# Patient Record
Sex: Female | Born: 1985 | Race: White | Hispanic: No | Marital: Married | Smoking: Current every day smoker
Health system: Southern US, Community
[De-identification: ages and names within clinical notes are randomized; demographics above are authoritative.]

## PROBLEM LIST (undated history)

## (undated) ENCOUNTER — Inpatient Hospital Stay (HOSPITAL_COMMUNITY): Payer: Self-pay

## (undated) DIAGNOSIS — J45909 Unspecified asthma, uncomplicated: Secondary | ICD-10-CM

## (undated) DIAGNOSIS — Z30017 Encounter for initial prescription of implantable subdermal contraceptive: Secondary | ICD-10-CM

## (undated) DIAGNOSIS — K219 Gastro-esophageal reflux disease without esophagitis: Secondary | ICD-10-CM

## (undated) DIAGNOSIS — K8020 Calculus of gallbladder without cholecystitis without obstruction: Secondary | ICD-10-CM

## (undated) DIAGNOSIS — G43909 Migraine, unspecified, not intractable, without status migrainosus: Secondary | ICD-10-CM

## (undated) DIAGNOSIS — Z8719 Personal history of other diseases of the digestive system: Secondary | ICD-10-CM

## (undated) DIAGNOSIS — G5600 Carpal tunnel syndrome, unspecified upper limb: Secondary | ICD-10-CM

## (undated) HISTORY — DX: Migraine, unspecified, not intractable, without status migrainosus: G43.909

## (undated) HISTORY — PX: TONSILLECTOMY: SUR1361

## (undated) HISTORY — PX: TONSILECTOMY, ADENOIDECTOMY, BILATERAL MYRINGOTOMY AND TUBES: SHX2538

## (undated) HISTORY — DX: Personal history of other diseases of the digestive system: Z87.19

## (undated) HISTORY — DX: Encounter for initial prescription of implantable subdermal contraceptive: Z30.017

## (undated) HISTORY — DX: Gastro-esophageal reflux disease without esophagitis: K21.9

## (undated) HISTORY — PX: CYSTOSCOPY W/ URETERAL STENT PLACEMENT: SHX1429

## (undated) HISTORY — PX: TUBAL LIGATION: SHX77

---
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
## 2007-02-03 ENCOUNTER — Other Ambulatory Visit: Admission: RE | Admit: 2007-02-03 | Discharge: 2007-02-03 | Payer: Self-pay | Admitting: Obstetrics and Gynecology

## 2007-06-05 ENCOUNTER — Emergency Department (HOSPITAL_COMMUNITY): Admission: EM | Admit: 2007-06-05 | Discharge: 2007-06-06 | Payer: Self-pay | Admitting: Emergency Medicine

## 2007-11-28 ENCOUNTER — Emergency Department (HOSPITAL_COMMUNITY): Admission: EM | Admit: 2007-11-28 | Discharge: 2007-11-28 | Payer: Self-pay | Admitting: Emergency Medicine

## 2008-01-14 ENCOUNTER — Emergency Department (HOSPITAL_COMMUNITY): Admission: EM | Admit: 2008-01-14 | Discharge: 2008-01-14 | Payer: Self-pay | Admitting: Emergency Medicine

## 2008-02-22 ENCOUNTER — Emergency Department (HOSPITAL_COMMUNITY): Admission: EM | Admit: 2008-02-22 | Discharge: 2008-02-22 | Payer: Self-pay | Admitting: Diagnostic Radiology

## 2008-02-26 ENCOUNTER — Emergency Department (HOSPITAL_COMMUNITY): Admission: EM | Admit: 2008-02-26 | Discharge: 2008-02-26 | Payer: Self-pay | Admitting: Emergency Medicine

## 2008-12-06 ENCOUNTER — Emergency Department (HOSPITAL_COMMUNITY): Admission: EM | Admit: 2008-12-06 | Discharge: 2008-12-06 | Payer: Self-pay | Admitting: Emergency Medicine

## 2009-02-28 ENCOUNTER — Emergency Department (HOSPITAL_COMMUNITY): Admission: EM | Admit: 2009-02-28 | Discharge: 2009-02-28 | Payer: Self-pay | Admitting: Emergency Medicine

## 2009-03-01 ENCOUNTER — Ambulatory Visit (HOSPITAL_COMMUNITY): Admission: RE | Admit: 2009-03-01 | Discharge: 2009-03-01 | Payer: Self-pay | Admitting: Emergency Medicine

## 2009-05-16 ENCOUNTER — Emergency Department (HOSPITAL_COMMUNITY): Admission: EM | Admit: 2009-05-16 | Discharge: 2009-05-16 | Payer: Self-pay | Admitting: Emergency Medicine

## 2009-06-20 ENCOUNTER — Other Ambulatory Visit: Admission: RE | Admit: 2009-06-20 | Discharge: 2009-06-20 | Payer: Self-pay | Admitting: Obstetrics and Gynecology

## 2009-06-22 ENCOUNTER — Inpatient Hospital Stay (HOSPITAL_COMMUNITY): Admission: AD | Admit: 2009-06-22 | Discharge: 2009-06-22 | Payer: Self-pay | Admitting: Obstetrics & Gynecology

## 2009-07-29 ENCOUNTER — Inpatient Hospital Stay (HOSPITAL_COMMUNITY): Admission: AD | Admit: 2009-07-29 | Discharge: 2009-07-30 | Payer: Self-pay | Admitting: Family Medicine

## 2009-09-11 ENCOUNTER — Emergency Department (HOSPITAL_COMMUNITY): Admission: EM | Admit: 2009-09-11 | Discharge: 2009-09-12 | Payer: Self-pay | Admitting: Emergency Medicine

## 2009-10-20 ENCOUNTER — Emergency Department (HOSPITAL_COMMUNITY): Admission: EM | Admit: 2009-10-20 | Discharge: 2009-10-21 | Payer: Self-pay | Admitting: Emergency Medicine

## 2009-10-31 ENCOUNTER — Emergency Department (HOSPITAL_COMMUNITY): Admission: EM | Admit: 2009-10-31 | Discharge: 2009-10-31 | Payer: Self-pay | Admitting: Emergency Medicine

## 2009-11-18 ENCOUNTER — Ambulatory Visit: Payer: Self-pay | Admitting: Nurse Practitioner

## 2009-12-27 ENCOUNTER — Emergency Department (HOSPITAL_COMMUNITY): Admission: EM | Admit: 2009-12-27 | Discharge: 2009-12-27 | Payer: Self-pay | Admitting: Emergency Medicine

## 2010-01-01 ENCOUNTER — Ambulatory Visit: Payer: Self-pay | Admitting: Advanced Practice Midwife

## 2010-01-01 ENCOUNTER — Inpatient Hospital Stay (HOSPITAL_COMMUNITY): Admission: AD | Admit: 2010-01-01 | Discharge: 2010-01-03 | Payer: Self-pay | Admitting: Obstetrics & Gynecology

## 2010-04-10 ENCOUNTER — Emergency Department (HOSPITAL_COMMUNITY): Admission: EM | Admit: 2010-04-10 | Discharge: 2010-04-10 | Payer: Self-pay | Admitting: Emergency Medicine

## 2010-04-24 ENCOUNTER — Inpatient Hospital Stay (HOSPITAL_COMMUNITY): Admission: AD | Admit: 2010-04-24 | Discharge: 2009-11-20 | Payer: Self-pay | Admitting: Family Medicine

## 2010-07-02 ENCOUNTER — Emergency Department (HOSPITAL_COMMUNITY)
Admission: EM | Admit: 2010-07-02 | Discharge: 2010-07-02 | Disposition: A | Discharge status: Home / Self Care | Payer: Self-pay | Attending: Emergency Medicine | Admitting: Emergency Medicine

## 2010-07-02 DIAGNOSIS — M25539 Pain in unspecified wrist: Secondary | ICD-10-CM | POA: Insufficient documentation

## 2010-07-02 DIAGNOSIS — R209 Unspecified disturbances of skin sensation: Secondary | ICD-10-CM | POA: Insufficient documentation

## 2010-07-04 ENCOUNTER — Emergency Department (HOSPITAL_COMMUNITY)
Admission: EM | Admit: 2010-07-04 | Discharge: 2010-07-05 | Discharge status: Against Medical Advice | Payer: Self-pay | Attending: Emergency Medicine | Admitting: Emergency Medicine

## 2010-07-29 LAB — CBC
HCT: 41.7 % (ref 36.0–46.0)
Hemoglobin: 14.9 g/dL (ref 12.0–15.0)
MCH: 29.6 pg (ref 26.0–34.0)
MCHC: 35.7 g/dL (ref 30.0–36.0)
MCV: 82.7 fL (ref 78.0–100.0)
RBC: 5.04 MIL/uL (ref 3.87–5.11)
RDW: 13.5 % (ref 11.5–15.5)
WBC: 9.2 10*3/uL (ref 4.0–10.5)

## 2010-07-29 LAB — COMPREHENSIVE METABOLIC PANEL
AST: 20 U/L (ref 0–37)
BUN: 11 mg/dL (ref 6–23)
Calcium: 8.9 mg/dL (ref 8.4–10.5)
Glucose, Bld: 84 mg/dL (ref 70–99)
Potassium: 3.5 mEq/L (ref 3.5–5.1)
Total Bilirubin: 0.4 mg/dL (ref 0.3–1.2)

## 2010-07-29 LAB — DIFFERENTIAL
Basophils Absolute: 0.1 10*3/uL (ref 0.0–0.1)
Basophils Relative: 1 % (ref 0–1)
Eosinophils Relative: 2 % (ref 0–5)
Monocytes Absolute: 0.6 10*3/uL (ref 0.1–1.0)
Monocytes Relative: 6 % (ref 3–12)
Neutro Abs: 6.6 10*3/uL (ref 1.7–7.7)

## 2010-07-29 LAB — URINALYSIS, ROUTINE W REFLEX MICROSCOPIC
Bilirubin Urine: NEGATIVE
Glucose, UA: NEGATIVE mg/dL
Ketones, ur: NEGATIVE mg/dL
Nitrite: NEGATIVE
Specific Gravity, Urine: 1.02 (ref 1.005–1.030)
Urobilinogen, UA: 0.2 mg/dL (ref 0.0–1.0)
pH: 5.5 (ref 5.0–8.0)

## 2010-07-29 LAB — URINE MICROSCOPIC-ADD ON

## 2010-08-01 LAB — URINALYSIS, ROUTINE W REFLEX MICROSCOPIC
Bilirubin Urine: NEGATIVE
Glucose, UA: NEGATIVE mg/dL
Ketones, ur: NEGATIVE mg/dL
Leukocytes, UA: NEGATIVE
Nitrite: NEGATIVE
Nitrite: NEGATIVE
Protein, ur: 30 mg/dL — AB
Specific Gravity, Urine: >1.03 — ABNORMAL HIGH (ref 1.005–1.030)
pH: 6 (ref 5.0–8.0)

## 2010-08-01 LAB — COMPREHENSIVE METABOLIC PANEL
BUN: 7 mg/dL (ref 6–23)
CO2: 22 mEq/L (ref 19–32)
Calcium: 8.8 mg/dL (ref 8.4–10.5)
Chloride: 107 mEq/L (ref 96–112)
Creatinine, Ser: 0.57 mg/dL (ref 0.4–1.2)
GFR calc non Af Amer: >60 mL/min (ref >60)
Glucose, Bld: 87 mg/dL (ref 70–99)
Potassium: 3.9 mEq/L (ref 3.5–5.1)
Sodium: 138 mEq/L (ref 135–145)

## 2010-08-01 LAB — URINALYSIS, DIPSTICK ONLY
Glucose, UA: NEGATIVE mg/dL
Protein, ur: NEGATIVE mg/dL
Specific Gravity, Urine: 1.02 (ref 1.005–1.030)
pH: 6 (ref 5.0–8.0)

## 2010-08-01 LAB — URIC ACID: Uric Acid, Serum: 5 mg/dL (ref 2.4–7.0)

## 2010-08-01 LAB — CBC
HCT: 32.1 % — ABNORMAL LOW (ref 36.0–46.0)
Hemoglobin: 11 g/dL — ABNORMAL LOW (ref 12.0–15.0)
Hemoglobin: 13.8 g/dL (ref 12.0–15.0)
MCH: 29.8 pg (ref 26.0–34.0)
MCH: 30.2 pg (ref 26.0–34.0)
MCHC: 34.2 g/dL (ref 30.0–36.0)
MCV: 88.4 fL (ref 78.0–100.0)
RBC: 4.63 MIL/uL (ref 3.87–5.11)
WBC: 11.4 10*3/uL — ABNORMAL HIGH (ref 4.0–10.5)
WBC: 8.8 10*3/uL (ref 4.0–10.5)

## 2010-08-01 LAB — RPR: RPR Ser Ql: NONREACTIVE

## 2010-08-01 LAB — URINE MICROSCOPIC-ADD ON

## 2010-08-03 LAB — CBC
HCT: 35.3 % — ABNORMAL LOW (ref 36.0–46.0)
Hemoglobin: 12.2 g/dL (ref 12.0–15.0)
MCH: 30.6 pg (ref 26.0–34.0)
MCHC: 34.7 g/dL (ref 30.0–36.0)
RBC: 3.99 MIL/uL (ref 3.87–5.11)

## 2010-08-03 LAB — DIFFERENTIAL
Basophils Absolute: 0 10*3/uL (ref 0.0–0.1)
Basophils Relative: 0 % (ref 0–1)
Eosinophils Absolute: 0.1 10*3/uL (ref 0.0–0.7)
Eosinophils Relative: 1 % (ref 0–5)
Neutrophils Relative %: 84 % — ABNORMAL HIGH (ref 43–77)

## 2010-08-03 LAB — URINE MICROSCOPIC-ADD ON

## 2010-08-03 LAB — URINE CULTURE

## 2010-08-03 LAB — COMPREHENSIVE METABOLIC PANEL
ALT: 13 U/L (ref 0–35)
AST: 14 U/L (ref 0–37)
Alkaline Phosphatase: 62 U/L (ref 39–117)
CO2: 25 mEq/L (ref 19–32)
Chloride: 104 mEq/L (ref 96–112)
GFR calc Af Amer: >60 mL/min (ref >60)
GFR calc non Af Amer: >60 mL/min (ref >60)
Glucose, Bld: 94 mg/dL (ref 70–99)
Sodium: 135 mEq/L (ref 135–145)
Total Bilirubin: 0.4 mg/dL (ref 0.3–1.2)

## 2010-08-03 LAB — URINALYSIS, ROUTINE W REFLEX MICROSCOPIC
Bilirubin Urine: NEGATIVE
Specific Gravity, Urine: 1.01 (ref 1.005–1.030)
Urobilinogen, UA: 0.2 mg/dL (ref 0.0–1.0)
pH: 6.5 (ref 5.0–8.0)

## 2010-08-07 LAB — URINALYSIS, ROUTINE W REFLEX MICROSCOPIC
Glucose, UA: NEGATIVE mg/dL
Ketones, ur: NEGATIVE mg/dL
Nitrite: NEGATIVE
Protein, ur: NEGATIVE mg/dL
Urobilinogen, UA: 0.2 mg/dL (ref 0.0–1.0)

## 2010-08-11 LAB — URINALYSIS, ROUTINE W REFLEX MICROSCOPIC
Glucose, UA: 100 mg/dL — AB
Hgb urine dipstick: NEGATIVE
Ketones, ur: NEGATIVE mg/dL
pH: 6 (ref 5.0–8.0)

## 2010-08-11 LAB — URINE MICROSCOPIC-ADD ON: RBC / HPF: NONE SEEN RBC/hpf (ref <3)

## 2010-08-18 LAB — URINALYSIS, ROUTINE W REFLEX MICROSCOPIC
Bilirubin Urine: NEGATIVE
Ketones, ur: NEGATIVE mg/dL
Nitrite: NEGATIVE
Specific Gravity, Urine: <1.005 — ABNORMAL LOW (ref 1.005–1.030)
Urobilinogen, UA: 0.2 mg/dL (ref 0.0–1.0)

## 2010-08-18 LAB — PREGNANCY, URINE: Preg Test, Ur: POSITIVE

## 2010-08-18 LAB — URINE MICROSCOPIC-ADD ON

## 2010-08-21 LAB — CBC
HCT: 43.9 % (ref 36.0–46.0)
MCV: 86.3 fL (ref 78.0–100.0)
RBC: 5.09 MIL/uL (ref 3.87–5.11)
WBC: 7.7 10*3/uL (ref 4.0–10.5)

## 2010-08-21 LAB — COMPREHENSIVE METABOLIC PANEL
AST: 14 U/L (ref 0–37)
BUN: 9 mg/dL (ref 6–23)
CO2: 28 mEq/L (ref 19–32)
Chloride: 106 mEq/L (ref 96–112)
Creatinine, Ser: 0.58 mg/dL (ref 0.4–1.2)
GFR calc non Af Amer: >60 mL/min (ref >60)
Glucose, Bld: 92 mg/dL (ref 70–99)
Total Bilirubin: 0.5 mg/dL (ref 0.3–1.2)

## 2010-08-21 LAB — URINALYSIS, ROUTINE W REFLEX MICROSCOPIC
Glucose, UA: NEGATIVE mg/dL
Hgb urine dipstick: NEGATIVE
Specific Gravity, Urine: 1.02 (ref 1.005–1.030)

## 2010-08-21 LAB — LIPASE, BLOOD: Lipase: 15 U/L (ref 11–59)

## 2010-08-21 LAB — DIFFERENTIAL
Basophils Absolute: 0 10*3/uL (ref 0.0–0.1)
Eosinophils Relative: 1 % (ref 0–5)
Lymphocytes Relative: 25 % (ref 12–46)
Neutro Abs: 5.2 10*3/uL (ref 1.7–7.7)

## 2010-09-21 ENCOUNTER — Emergency Department (HOSPITAL_COMMUNITY): Payer: Medicaid Other

## 2010-09-21 ENCOUNTER — Inpatient Hospital Stay (HOSPITAL_COMMUNITY)
Admission: EM | Admit: 2010-09-21 | Discharge: 2010-09-23 | DRG: 195 | Disposition: A | Discharge status: Home / Self Care | Payer: Medicaid Other | Attending: Internal Medicine | Admitting: Internal Medicine

## 2010-09-21 DIAGNOSIS — R091 Pleurisy: Secondary | ICD-10-CM | POA: Diagnosis present

## 2010-09-21 DIAGNOSIS — R000 Tachycardia, unspecified: Secondary | ICD-10-CM | POA: Diagnosis present

## 2010-09-21 DIAGNOSIS — J189 Pneumonia, unspecified organism: Principal | ICD-10-CM | POA: Diagnosis present

## 2010-09-21 DIAGNOSIS — K219 Gastro-esophageal reflux disease without esophagitis: Secondary | ICD-10-CM | POA: Diagnosis present

## 2010-09-21 LAB — URINALYSIS, ROUTINE W REFLEX MICROSCOPIC
Bilirubin Urine: NEGATIVE
Protein, ur: NEGATIVE mg/dL
Urobilinogen, UA: 0.2 mg/dL (ref 0.0–1.0)

## 2010-09-21 LAB — BASIC METABOLIC PANEL
GFR calc Af Amer: >60 mL/min (ref >60)
GFR calc non Af Amer: >60 mL/min (ref >60)
Glucose, Bld: 124 mg/dL — ABNORMAL HIGH (ref 70–99)
Potassium: 3.9 mEq/L (ref 3.5–5.1)
Sodium: 140 mEq/L (ref 135–145)

## 2010-09-21 LAB — DIFFERENTIAL
Basophils Absolute: 0 10*3/uL (ref 0.0–0.1)
Eosinophils Relative: 1 % (ref 0–5)
Lymphocytes Relative: 6 % — ABNORMAL LOW (ref 12–46)

## 2010-09-21 LAB — URINE MICROSCOPIC-ADD ON

## 2010-09-21 LAB — CBC
HCT: 44.8 % (ref 36.0–46.0)
Platelets: 227 10*3/uL (ref 150–400)
RDW: 13.3 % (ref 11.5–15.5)
WBC: 15.5 10*3/uL — ABNORMAL HIGH (ref 4.0–10.5)

## 2010-09-21 LAB — POCT PREGNANCY, URINE: Preg Test, Ur: NEGATIVE

## 2010-09-21 MED ORDER — IOHEXOL 350 MG/ML SOLN: 100.0000 mL | Freq: Once | INTRAVENOUS | Status: AC | PRN

## 2010-09-22 LAB — INFLUENZA PANEL BY PCR (TYPE A & B): H1N1 flu by pcr: NOT DETECTED

## 2010-09-22 LAB — DIFFERENTIAL
Basophils Relative: 0 % (ref 0–1)
Lymphs Abs: 1 10*3/uL (ref 0.7–4.0)
Monocytes Relative: 6 % (ref 3–12)
Neutro Abs: 10.5 10*3/uL — ABNORMAL HIGH (ref 1.7–7.7)
Neutrophils Relative %: 84 % — ABNORMAL HIGH (ref 43–77)

## 2010-09-22 LAB — CBC
Hemoglobin: 12.9 g/dL (ref 12.0–15.0)
MCH: 28.2 pg (ref 26.0–34.0)
RBC: 4.58 MIL/uL (ref 3.87–5.11)
WBC: 12.5 10*3/uL — ABNORMAL HIGH (ref 4.0–10.5)

## 2010-09-22 LAB — BASIC METABOLIC PANEL
CO2: 22 mEq/L (ref 19–32)
Chloride: 104 mEq/L (ref 96–112)
Creatinine, Ser: 0.6 mg/dL (ref 0.4–1.2)
GFR calc Af Amer: >60 mL/min (ref >60)
Potassium: 3.7 mEq/L (ref 3.5–5.1)

## 2010-09-26 NOTE — H&P (Signed)
NAME:  Patricia Williamson, Patricia Williamson NO.:  [id number]  MEDICAL RECORD NO.:  [id number]           PATIENT TYPE:  I  LOCATION:  A311                          FACILITY:  [facility]  PHYSICIAN:  Vania Rea, M.D. DATE OF BIRTH:  Dec 16, 1985  DATE OF ADMISSION:  09/21/2010 DATE OF DISCHARGE:  LH                             HISTORY & PHYSICAL   PRIMARY CARE PHYSICIAN:  Corrie Mckusick, MD  DICTATING PHYSICIAN:  Vania Rea, MD  CHIEF COMPLAINT:  Chest pain and shortness of breath since yesterday.  HISTORY OF PRESENT ILLNESS:  This is a 25 year old morbidly obese Caucasian lady with a history of childhood asthma but has not had any asthma attacks for many years who yesterday had sudden onset of chest pain and noticed extreme shortness of breath when she tried to lie down flat.  Initially there was no cough and no wheezing nor fever.  She did notice today that she was having chills and felt extremely cold.  Late last night she started coughing, however, the cough was nonproductive. She came to the emergency room where she was found to be very short of breath and had a CT angiogram of the chest done to rule out pulmonary embolus.  It revealed evidence of pneumonia and bronchiolitis and the hospitalist service was called to assist with management.  The patient describes the pain as central and aggravated by breathing, aggravated by coughing.  The patient has had no attacks of asthma since childhood but in the emergency room she developed wheezing and received bronchodilators by nebulizer.  As reported the cough is nonproductive.  She has had no sweating, no calf pain.  No nausea, no vomiting, no diarrhea.  PAST MEDICAL HISTORY: 1. Childhood asthma. 2. GERD. 3. Anxiety and depression. 4. History of pyelonephritis, recurrent.  PAST SURGICAL HISTORY:  Tonsillectomy and history of stent placement for renal stones at age 51 and 76.  MEDICATIONS:  None.  ALLERGIES:  SULFA  causes anaphylaxis.  SOCIAL HISTORY:  No history of tobacco, alcohol, or illicit drug use. She is unemployed.  She is gravida 1 and has a 17-month-old baby.  FAMILY HISTORY:  Significant for father who had liver cirrhosis and apparently heart disease.  He was a heavy drinker.  She has a brother with asthma.  No other significant family medical problems.  REVIEW OF SYSTEMS:  Other than noted above unremarkable.  PHYSICAL EXAMINATION:  GENERAL:  A very pleasant but somewhat apprehensive young Caucasian lady lying in the stretcher very tachypneic. VITALS:  Temperature is 100.0, pulse 135, respiration 22, blood pressure 109/45.  She is saturating at 100% on 2 liters.  She gives her height as exactly 5 feet and her weight as 200 pounds. HEENT:  Her pupils are round, equal, and reactive.  Mucous membranes pink and dry.  Anicteric. NECK:  No cervical lymphadenopathy.  She had a thick neck.  No thyromegaly or carotid bruit. CHEST:  Actually clear to auscultation bilaterally.  There is no wheezing or crackles. CARDIOVASCULAR SYSTEM:  Tachycardiac with distant heart sounds.  No murmurs heard. ABDOMEN:  Obese, soft, nontender.  No masses. EXTREMITIES:  Without edema.  No calf tenderness.  Dorsalis pulses are 2+ and bounding bilaterally.  She has no bony joint deformity. CENTRAL NERVOUS SYSTEM:  Cranial nerves II-XII are grossly intact.  She has no focal lateralizing signs.  LABORATORY DATA:  Her white count is elevated to 15.5.  Her hemoglobin is 15.0, platelets 227,000.  Absolute neutrophil count is 13.5.  Her sodium is 140, potassium 3.9, chloride 103, CO2 26, glucose 124, BUN 10, creatinine 0.6, calcium 9.8.  Urine pregnancy test is negative.  Her urinalysis shows hazy urine with specific gravity 1.020, trace of blood, is otherwise completely unremarkable.  Microscopy shows contamination with a few epithelial cells, 3-6 white cells, 3-6 red cells.  Two-view chest x-ray shows moderate  hyperinflation, increased perihilar markings consistent with asthma.  CT angiogram of the chest shows no convincing evidence of pulmonary embolus but specifically reduced by her body habitus with shallow breathing and there is a confluent air opacity in the left upper lobe favoring a localized pneumonia.  Repeat noncontrast CT recommended to later date to ensure resolution since malignancy is also a possibility.  She also has confluent airspace opacities in the right middle and right lower lobe and fine scattered ground-glass opacities which could represent bronchiolitis obliterans perhaps with organizing pneumonia.  ASSESSMENT: 1. Community-acquired pneumonia. 2. Asthma. 3. 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 4. Persistent tachycardia. 5. History of gastroesophageal reflux disease. 6. History of anxiety and depression. 7. Morbid obesity.  PLAN: 1. We will admit this lady for initiation of treatment of her     pneumonia.  She has already received Rocephin and Zithromax.  We     will continue this regimen rather than the quinolones in this young     person since that may lead to tendon problems.  We will hydrate her     vigorously and if she should spike a temperature despite these     antibiotics, we will do blood cultures. 2. We will add mucolytics and nebulizers as necessary but will not     give steroids. 3. Other plans as per orders.     Vania Rea, M.D.     LC/MEDQ  D:  09/21/2010  T:  09/21/2010  Job:  [id number]  cc:   Corrie Mckusick, M.D. Fax: [phone]  Electronically Signed by Vania Rea M.D. on 09/26/2010 11:41:59 PM

## 2010-09-27 LAB — CULTURE, BLOOD (ROUTINE X 2): Culture: NO GROWTH

## 2010-10-03 NOTE — Op Note (Signed)
Patricia Williamson, KARRER NO.:  [id number]   MEDICAL RECORD NO.:  [id number]          PATIENT TYPE:  OIB   LOCATION:  2899                         FACILITY:  [facility]   PHYSICIAN:  Jon Gills, M.D.  DATE OF BIRTH:  04-28-1986   DATE OF PROCEDURE:  10/03/2004  DATE OF DISCHARGE:  10/03/2004                                 OPERATIVE REPORT   PREOPERATIVE DIAGNOSIS:  Epigastric abdominal pain and nausea.   POSTOPERATIVE DIAGNOSIS:  Epigastric abdominal pain and nausea.   PROCEDURE:  Upper gastrointestinal endoscopy with biopsy.   SURGEON:  Jon Gills, M.D.   ASSISTANT:  None.   DESCRIPTION OF FINDINGS:  Following informed written consent, the patient  was taken to the operating room and placed under general anesthesia with  cardiopulmonary monitoring.  She remained in the supine position and the  Olympus endoscope was advanced by mouth without difficulty.  There was no  visible evidence for esophagitis, gastritis, duodenitis, or peptic ulcer  disease.  A solitary gastric biopsy was negative by CLOtest but several  gastric biopsies revealed the presence of Helicobacter pylori.  Duodenal  biopsies were unremarkable.  The endoscope was gradually withdrawn and the  patient was awakened and taken to the recovery room in satisfactory  condition.  She will be released later today to the care of her family.  Once the biopsy results were made available to me, Melicia was started on a 14  day course of Prevpac and will be re-evaluated in approximately three weeks  for presumptive Helicobacter gastritis.   DESCRIPTION TECHNICAL PROCEDURES USED:  Olympus GIF-160 endoscope with cold  biopsy forceps.   SPECIMENS:  Gastric x 1 CLO, gastric x 2 formalin, duodenal x 3 formalin.      JHC/MEDQ  D:  10/16/2004  T:  10/16/2004  Job:  [id number]   cc:   Geoffery Spruce, M.D.

## 2010-10-03 NOTE — H&P (Signed)
NAME:  Patricia Williamson, Patricia Williamson NO.:  [id number]   MEDICAL RECORD NO.:  [id number]          PATIENT TYPE:  AMB   LOCATION:  DAY                           FACILITY:  [facility]   PHYSICIAN:  Ky Barban, M.D.DATE OF BIRTH:  02/07/1986   DATE OF ADMISSION:  DATE OF DISCHARGE:  LH                              HISTORY & PHYSICAL   HISTORY AND PHYSICAL:  This 25 year old female is coming Monday to have  cystoscopy done in [facility].   CHIEF COMPLAINT:  Recurrent gross hematuria.   HISTORY OF PRESENT ILLNESS:  An 25 year old female who is complaining  that she is having recurrent episodes of gross __________ painless  hematuria.  She was referred here by the health department.  She also  gives a history of having urinary tract infections for the last three  years, has some dysuria.  No fever or chills.   PAST MEDICAL HISTORY:  Negative.   FAMILY HISTORY:  Negative.   ALLERGIES:  None.   MEDICATIONS:  None.   REVIEW OF SYSTEMS:  Unremarkable.   PHYSICAL EXAMINATION:  VITAL SIGNS:  Blood pressure 130/80, temperature  is normal __________ negative.  HEENT:  Negative.  CHEST:  __________.  HEART:  Regular S1 and S2; no gallop, no murmur.  ABDOMEN:  Soft, flat.  Liver, kidneys and spleen are nonpalpable.   IMPRESSION:  Recurrent gross hematuria, possible recurrent urinary tract  infection.   She had an abdominopelvic CT done with and without contrast which was  negative.  Urine cytologies are negative.  Urine culture is negative.   Will go ahead and do a cystoscopy under anesthesia as outpatient.      Ky Barban, M.D.  Electronically Signed     MIJ/MEDQ  D:  08/12/2006  T:  08/12/2006  Job:  [id number]

## 2010-10-03 NOTE — Op Note (Signed)
   NAME:  BAILA, ROUSE NO.:  [id number]   MEDICAL RECORD NO.:  [id number]                   PATIENT TYPE:  AMB   LOCATION:  DAY                                  FACILITY:  [facility]   PHYSICIAN:  Ky Barban, M.D.            DATE OF BIRTH:  1985-11-09   DATE OF PROCEDURE:  DATE OF DISCHARGE:                                 OPERATIVE REPORT   PREOPERATIVE DIAGNOSIS:  Recurrent cystitis.   POSTOPERATIVE DIAGNOSIS:  Urethritis, urethral stenosis.   PROCEDURE:  Cystoscopy and dilation.   ANESTHESIA:  General.   DESCRIPTION OF PROCEDURE:  The patient was given general anesthesia and  placed in the lithotomy position after prep and drape.  A #25 cystoscope was  introduced into the bladder.  It was thoroughly inspected.  There was no  evidence of any tumors, stones, foreign body, or inflammation.  Both  ureteral orifices were located at the normal side with clear efflux and  normal in shape.  The urethra showed __________ chronic inflammatory  changes.  It was calibrated to 25 [place].  The cystoscope was removed.  The  urethra was dilated to 17 [place].  The patient left the operating room in  satisfactory condition.                                               Ky Barban, M.D.    MIJ/MEDQ  D:  09/05/2002  T:  09/05/2002  Job:  [id number]

## 2010-10-03 NOTE — Op Note (Signed)
NAME:  [name], Patricia Williamson NO.:  [id number]   MEDICAL RECORD NO.:  [id number]          PATIENT TYPE:  AMB   LOCATION:  DAY                           FACILITY:  [facility]   PHYSICIAN:  Ky Barban, M.D.DATE OF BIRTH:  07-19-1985   DATE OF PROCEDURE:  08/16/2006  DATE OF DISCHARGE:                               OPERATIVE REPORT   PREOPERATIVE DIAGNOSIS:  Gross hematuria, recurrent urinary tract  infection.   POSTOPERATIVE DIAGNOSIS:  Ureteral stenosis.   OPERATION PERFORMED:  Cysto and dilation.   SURGEON:  Ky Barban, M.D.   DESCRIPTION OF PROCEDURE:  The patient was given general endotracheal  anesthesia, placed in lithotomy position, usual prep and drape.  A #24  cystoscope was introduced into the bladder.  It was thoroughly  inspected, no evidence of tumor, stones, foreign body.  Urethra also  looks normal.  __________  the 69 French scope was removed.  Urethra  dilated to 44 [place].  Bimanual pelvic exam is done which was  unremarkable.  Patient left operating room in satisfactory condition.      Ky Barban, M.D.  Electronically Signed     MIJ/MEDQ  D:  08/16/2006  T:  08/16/2006  Job:  [id number]

## 2010-10-07 NOTE — Discharge Summary (Signed)
  [name], [name]                ACCOUNT NO.:  [id number]  MEDICAL RECORD NO.:  [id number]           PATIENT TYPE:  I  LOCATION:  A311                          FACILITY:  [facility]  PHYSICIAN:  See Beharry L. Lendell Caprice, MDDATE OF BIRTH:  06-17-1985  DATE OF ADMISSION:  09/21/2010 DATE OF DISCHARGE:  05/08/2012LH                              DISCHARGE SUMMARY   DISCHARGE DIAGNOSES: 1. Community-acquired pneumonia. 2. History of recurrent pyelonephritis. 3. History of anxiety and depression. 4. History of childhood asthma. 5. Gastroesophageal reflux disease.  DISCHARGE MEDICATIONS: 1. Azithromycin 250 mg p.o. daily to complete a 5-day course. 2. Ceftin 500 mg p.o. b.i.d. to complete a 10-day course total. 3. Ibuprofen 400 mg p.o. q.a.c. for 4 days. 4. Tylenol 650 mg p.o. q.4 h. p.r.n. pain or fever.  CONDITION:  Stable.  ACTIVITY:  Increase slowly.  DIET:  Plenty of liquids.  Follow up with Dr. Phillips Odor if symptoms worsen.  CONSULTATIONS:  None.  PROCEDURES:  None.  LABORATORY DATA:  Blood cultures to date are negative, final results pending.  White blood cell count on admission was 15,000 with 87% neutrophils.  CBC was, otherwise, unremarkable.  Basic metabolic panel unremarkable.  Urine pregnancy negative.  Urinalysis showed negative nitrite, negative leukocyte esterase, trace blood, otherwise.  Influenza screen A, B, and H1N1 all negative.  DIAGNOSTICS:  Two views of the chest showed hyperinflation and increased perihilar markings. CT angiogram of the chest showed no pulmonary embolus, left upper lobe airspace opacity.  HISTORY AND HOSPITAL COURSE:  Please see H and P for details.  Ms. Patricia Williamson is a 25 year old white female who presented with shortness of breath and chest pain.  She had cough which was nonproductive.  She had a temperature in the emergency room of 100 and a heart rate of 135. Vital signs otherwise stable.  She had clear lungs.  She had a leukocytosis and  CAT scan consistent with pneumonia.  She did spike a fever to 102 in-house but at discharge was feeling much better.  She will ambulate around the unit and if stable can be discharged home.  She had some pleurisy which responded to nonsteroidal anti-inflammatories. She is eating well and her heart rate is much improved.  Her sinus tachycardia was related to the pneumonia.  Her other medical issues remained stable during the hospitalization, and she is anxious to get back home to her infant.     [name] L. Lendell Caprice, MD     CLS/MEDQ  D:  09/23/2010  T:  09/23/2010  Job:  [id number]  Electronically Signed by Crista Curb MD on 10/07/2010 10:59:47 AM

## 2011-02-05 LAB — URINALYSIS, ROUTINE W REFLEX MICROSCOPIC
Glucose, UA: NEGATIVE
Leukocytes, UA: NEGATIVE
Nitrite: NEGATIVE
Specific Gravity, Urine: 1.01
pH: 6

## 2011-02-05 LAB — URINE MICROSCOPIC-ADD ON

## 2011-03-04 LAB — COMPREHENSIVE METABOLIC PANEL
Alkaline Phosphatase: 66
BUN: 9
CO2: 28
Chloride: 107
Creatinine, Ser: 0.62
GFR calc non Af Amer: >60
Glucose, Bld: 109 — ABNORMAL HIGH
Potassium: 3.7
Total Bilirubin: 0.6

## 2011-03-04 LAB — URINE MICROSCOPIC-ADD ON

## 2011-03-04 LAB — URINALYSIS, ROUTINE W REFLEX MICROSCOPIC
Bilirubin Urine: NEGATIVE
Ketones, ur: NEGATIVE
Leukocytes, UA: NEGATIVE
Nitrite: NEGATIVE
Urobilinogen, UA: 0.2
pH: 6

## 2011-03-04 LAB — DIFFERENTIAL
Basophils Absolute: 0
Eosinophils Absolute: 0.1
Eosinophils Relative: 1
Lymphocytes Relative: 19 — ABNORMAL LOW
Lymphs Abs: 1.6
Neutrophils Relative %: 71

## 2011-03-04 LAB — BASIC METABOLIC PANEL
BUN: 9
Creatinine, Ser: 0.6
GFR calc non Af Amer: >60
Glucose, Bld: 112 — ABNORMAL HIGH

## 2011-03-04 LAB — CBC
MCV: 84.3
Platelets: 258
RDW: 12.7
WBC: 8.3

## 2011-03-04 LAB — PREGNANCY, URINE: Preg Test, Ur: NEGATIVE

## 2011-03-04 LAB — LIPASE, BLOOD: Lipase: 15

## 2011-06-07 ENCOUNTER — Emergency Department (HOSPITAL_COMMUNITY)
Admission: EM | Admit: 2011-06-07 | Discharge: 2011-06-07 | Discharge status: Against Medical Advice | Payer: Self-pay | Attending: Emergency Medicine | Admitting: Emergency Medicine

## 2011-06-07 ENCOUNTER — Encounter (HOSPITAL_COMMUNITY): Payer: Self-pay | Admitting: *Deleted

## 2011-06-07 DIAGNOSIS — N898 Other specified noninflammatory disorders of vagina: Secondary | ICD-10-CM | POA: Insufficient documentation

## 2011-06-07 NOTE — ED Notes (Signed)
Pt not in waiting room. Registration states that they saw them get in a van and leave.

## 2011-06-07 NOTE — ED Notes (Signed)
Not in waiting room x 3. Pt left without being seen by MD.

## 2011-06-07 NOTE — ED Notes (Signed)
Pt not in waiting room x 2 

## 2011-06-07 NOTE — ED Notes (Addendum)
Pt c/o off and on vaginal bleeding since this am. Pt has had Norplant x 1 year and this is the first time she has had bleeding. Pt also c/o cramping with the bleeding.

## 2011-06-08 ENCOUNTER — Emergency Department (HOSPITAL_COMMUNITY): Payer: Self-pay

## 2011-06-08 ENCOUNTER — Encounter (HOSPITAL_COMMUNITY): Payer: Self-pay

## 2011-06-08 ENCOUNTER — Emergency Department (HOSPITAL_COMMUNITY)
Admission: EM | Admit: 2011-06-08 | Discharge: 2011-06-08 | Disposition: A | Discharge status: Home / Self Care | Payer: Self-pay | Attending: Emergency Medicine | Admitting: Emergency Medicine

## 2011-06-08 DIAGNOSIS — S6000XA Contusion of unspecified finger without damage to nail, initial encounter: Secondary | ICD-10-CM | POA: Insufficient documentation

## 2011-06-08 DIAGNOSIS — F17200 Nicotine dependence, unspecified, uncomplicated: Secondary | ICD-10-CM | POA: Insufficient documentation

## 2011-06-08 DIAGNOSIS — S6010XA Contusion of unspecified finger with damage to nail, initial encounter: Secondary | ICD-10-CM

## 2011-06-08 DIAGNOSIS — Y92009 Unspecified place in unspecified non-institutional (private) residence as the place of occurrence of the external cause: Secondary | ICD-10-CM | POA: Insufficient documentation

## 2011-06-08 DIAGNOSIS — W230XXA Caught, crushed, jammed, or pinched between moving objects, initial encounter: Secondary | ICD-10-CM | POA: Insufficient documentation

## 2011-06-08 DIAGNOSIS — Z23 Encounter for immunization: Secondary | ICD-10-CM | POA: Insufficient documentation

## 2011-06-08 MED ORDER — TETANUS-DIPHTH-ACELL PERTUSSIS 5-2.5-18.5 LF-MCG/0.5 IM SUSP
0.5000 mL | Freq: Once | INTRAMUSCULAR | Status: AC
  Administered 2011-06-08: 0.5 mL via INTRAMUSCULAR
  Filled 2011-06-08: qty 0.5

## 2011-06-08 MED ORDER — BACITRACIN ZINC 500 UNIT/GM EX OINT
TOPICAL_OINTMENT | CUTANEOUS | Status: AC
  Administered 2011-06-08: 17:00:00
  Filled 2011-06-08: qty 0.9

## 2011-06-08 MED ORDER — IBUPROFEN 800 MG PO TABS
800.0000 mg | ORAL_TABLET | Freq: Once | ORAL | Status: AC
  Administered 2011-06-08: 800 mg via ORAL
  Filled 2011-06-08: qty 1

## 2011-06-08 MED ORDER — HYDROCODONE-ACETAMINOPHEN 5-325 MG PO TABS: 1.0000 | ORAL_TABLET | Freq: Four times a day (QID) | ORAL | Status: AC | PRN

## 2011-06-08 NOTE — ED Provider Notes (Signed)
History     CSN: [id number]  Arrival date & time 06/08/11  1530   First MD Initiated Contact with Patient 06/08/11 1607      Chief Complaint  Patient presents with  . Finger Injury    (Consider location/radiation/quality/duration/timing/severity/associated sxs/prior treatment) HPI Comments: Pt had a outside door open.  Her mom opened another door and the wind current slammed the door on the pt's finger.  The history is provided by the patient. No language interpreter was used.    History reviewed. No pertinent past medical history.  Past Surgical History  Procedure Date  . Cystoscopy w/ ureteral stent placement     No family history on file.  History  Substance Use Topics  . Smoking status: Current Everyday Smoker    Types: Cigarettes  . Smokeless tobacco: Not on file  . Alcohol Use: No    OB History    Grav Para Term Preterm Abortions TAB SAB Ect Mult Living                  Review of Systems  Musculoskeletal:       Finger injury  All other systems reviewed and are negative.    Allergies  Sulfa antibiotics  Home Medications   Current Outpatient Rx  Name Route Sig Dispense Refill  . ETONOGESTREL 68 MG [place] IMPL Subcutaneous Inject 1 each into the skin once.      BP 120/66  Pulse 78  Temp(Src) 97.5 F (36.4 C) (Oral)  Resp 18  Ht 5' (1.524 m)  Wt 184 lb (83.462 kg)  BMI 35.94 kg/m2  SpO2 100%  Physical Exam  Nursing note and vitals reviewed. Constitutional: She is oriented to person, place, and time. She appears well-developed and well-nourished. No distress.  HENT:  Head: Normocephalic and atraumatic.  Eyes: EOM are normal.  Neck: Normal range of motion.  Cardiovascular: Normal rate, regular rhythm and normal heart sounds.   Pulmonary/Chest: Effort normal and breath sounds normal.  Abdominal: Soft. She exhibits no distension. There is no tenderness.  Musculoskeletal: She exhibits tenderness.       Left hand: She exhibits decreased range  of motion, tenderness and bony tenderness. She exhibits normal capillary refill, no deformity and no laceration. normal sensation noted. Normal strength noted.       Hands: Neurological: She is alert and oriented to person, place, and time.  Skin: Skin is warm and dry.  Psychiatric: She has a normal mood and affect. Judgment normal.    ED Course  Procedures (including critical care time)  Labs Reviewed - No data to display No results found.   No diagnosis found.    MDM   1655-two small drill holes placed in nail by me with 18 ga. Needle.  Pt tolerated well.       Worthy Rancher, PA 06/08/11 1652  Worthy Rancher, PA 06/08/11 [phone]

## 2011-06-08 NOTE — ED Provider Notes (Signed)
Medical screening examination/treatment/procedure(s) were performed by non-physician practitioner and as supervising physician I was immediately available for consultation/collaboration.  Tori Cupps R. Cashay Manganelli, MD 06/08/11 2334 

## 2011-06-08 NOTE — ED Notes (Signed)
Pt states got finger slammed in front door of house. Lt 4 th digit has noted swelling/redness and small laceration at first knuckle of finger. X-ray completed and results pending.

## 2011-06-08 NOTE — ED Notes (Signed)
Pt accidentally shut left ring finger in door.  Nail bed purple, abrasion noted.

## 2012-01-09 ENCOUNTER — Emergency Department (HOSPITAL_COMMUNITY)
Admission: EM | Admit: 2012-01-09 | Discharge: 2012-01-09 | Disposition: A | Discharge status: Home / Self Care | Payer: Self-pay | Attending: Emergency Medicine | Admitting: Emergency Medicine

## 2012-01-09 ENCOUNTER — Encounter (HOSPITAL_COMMUNITY): Payer: Self-pay | Admitting: *Deleted

## 2012-01-09 DIAGNOSIS — M5412 Radiculopathy, cervical region: Secondary | ICD-10-CM

## 2012-01-09 DIAGNOSIS — R209 Unspecified disturbances of skin sensation: Secondary | ICD-10-CM | POA: Insufficient documentation

## 2012-01-09 DIAGNOSIS — M79609 Pain in unspecified limb: Secondary | ICD-10-CM | POA: Insufficient documentation

## 2012-01-09 MED ORDER — IBUPROFEN 600 MG PO TABS: ORAL_TABLET | ORAL | Status: DC

## 2012-01-09 MED ORDER — IBUPROFEN 800 MG PO TABS
800.0000 mg | ORAL_TABLET | Freq: Once | ORAL | Status: AC
  Administered 2012-01-09: 800 mg via ORAL
  Filled 2012-01-09: qty 1

## 2012-01-09 NOTE — ED Provider Notes (Signed)
History     CSN: [id number]  Arrival date & time 01/09/12  2235   First MD Initiated Contact with Patient 01/09/12 2308      Chief Complaint  Patient presents with  . Hand Pain    (Consider location/radiation/quality/duration/timing/severity/associated sxs/prior treatment) HPI Patricia Williamson is a 26 y.o. female who presents to the Emergency Department complaining of bilateral hand numbness and tinglong that has been increasing since beginning work a week ago. Job requires multiple small tasks with the hands however no continuous repetitive movements. She has experienced some numbness in the past when holding her daughter. Her thumbs become numb first then the second and third fingers. She has noticed that at night, she often times will awaken with numbness and tingling in both hands. If she hangs her hands in a downward position the feeling will return.     History reviewed. No pertinent past medical history.  Past Surgical History  Procedure Date  . Cystoscopy w/ ureteral stent placement     History reviewed. No pertinent family history.  History  Substance Use Topics  . Smoking status: Current Everyday Smoker    Types: Cigarettes  . Smokeless tobacco: Not on file  . Alcohol Use: No    OB History    Grav Para Term Preterm Abortions TAB SAB Ect Mult Living                  Review of Systems  Constitutional: Negative for fever.       10 Systems reviewed and are negative for acute change except as noted in the HPI.  HENT: Negative for congestion.   Eyes: Negative for discharge and redness.  Respiratory: Negative for cough and shortness of breath.   Cardiovascular: Negative for chest pain.  Gastrointestinal: Negative for vomiting and abdominal pain.  Musculoskeletal: Negative for back pain.       Hand numbness and tingling  Skin: Negative for rash.  Neurological: Negative for syncope, numbness and headaches.  Psychiatric/Behavioral:       No behavior change.     Allergies  Sulfa antibiotics  Home Medications   Current Outpatient Rx  Name Route Sig Dispense Refill  . ETONOGESTREL 68 MG [place] IMPL Subcutaneous Inject 1 each into the skin once.      BP 144/68  Pulse 63  Temp 98.3 F (36.8 C) (Oral)  Resp 18  Ht 5' (1.524 m)  Wt 185 lb (83.915 kg)  BMI 36.13 kg/m2  SpO2 100%  Physical Exam  Nursing note and vitals reviewed. Constitutional: She appears well-developed and well-nourished.       Awake, alert, nontoxic appearance.  HENT:  Head: Atraumatic.  Eyes: Right eye exhibits no discharge. Left eye exhibits no discharge.  Neck: Neck supple.  Pulmonary/Chest: Effort normal. She exhibits no tenderness.  Abdominal: Soft. There is no tenderness. There is no rebound.  Musculoskeletal: She exhibits no tenderness.       Baseline ROM, no obvious new focal weakness.Good cap refill bilaterally. No change in sensation to light touch to either hand. Description of areas affected c/w medial nerve distribution.  Neurological:       Mental status and motor strength appears baseline for patient and situation.  Skin: No rash noted.  Psychiatric: She has a normal mood and affect.    ED Course  Procedures (including critical care time)     MDM  Patient with recurrent numbness and tingling to thumb, second and third fingers. Initiated antiinflammatory therapy. Referral to orthopedist.Pt  stable in ED with no significant deterioration in condition.The patient appears reasonably screened and/or stabilized for discharge and I doubt any other medical condition or other [facility] requiring further screening, evaluation, or treatment in the ED at this time prior to discharge.  MDM Reviewed: nursing note and vitals           Nicoletta Dress. Colon Branch, MD 01/09/12 2322

## 2012-01-09 NOTE — ED Notes (Signed)
Pt c/o bilateral hand pain and numbness.

## 2012-02-20 ENCOUNTER — Emergency Department (HOSPITAL_COMMUNITY)
Admission: EM | Admit: 2012-02-20 | Discharge: 2012-02-20 | Disposition: A | Discharge status: Home / Self Care | Payer: Medicaid Other | Attending: Emergency Medicine | Admitting: Emergency Medicine

## 2012-02-20 ENCOUNTER — Encounter (HOSPITAL_COMMUNITY): Payer: Self-pay | Admitting: *Deleted

## 2012-02-20 DIAGNOSIS — F17200 Nicotine dependence, unspecified, uncomplicated: Secondary | ICD-10-CM | POA: Insufficient documentation

## 2012-02-20 DIAGNOSIS — Y92009 Unspecified place in unspecified non-institutional (private) residence as the place of occurrence of the external cause: Secondary | ICD-10-CM | POA: Insufficient documentation

## 2012-02-20 DIAGNOSIS — R079 Chest pain, unspecified: Secondary | ICD-10-CM | POA: Insufficient documentation

## 2012-02-20 DIAGNOSIS — R0602 Shortness of breath: Secondary | ICD-10-CM | POA: Insufficient documentation

## 2012-02-20 DIAGNOSIS — Z882 Allergy status to sulfonamides status: Secondary | ICD-10-CM | POA: Insufficient documentation

## 2012-02-20 DIAGNOSIS — T754XXA Electrocution, initial encounter: Secondary | ICD-10-CM

## 2012-02-20 DIAGNOSIS — W868XXA Exposure to other electric current, initial encounter: Secondary | ICD-10-CM | POA: Insufficient documentation

## 2012-02-20 DIAGNOSIS — J45909 Unspecified asthma, uncomplicated: Secondary | ICD-10-CM | POA: Insufficient documentation

## 2012-02-20 HISTORY — DX: Unspecified asthma, uncomplicated: J45.909

## 2012-02-20 NOTE — ED Provider Notes (Signed)
History   This chart was scribed for Geoffery Lyons, MD scribed by Magnus Sinning. The patient was seen in room [facility] at 22:00    CSN: [id number]  Arrival date & time 02/20/12  2135    Chief Complaint  Patient presents with  . Chest Pain  . Shortness of Breath    (Consider location/radiation/quality/duration/timing/severity/associated sxs/prior treatment) Patient is a 26 y.o. female presenting with chest pain and shortness of breath. The history is provided by the patient. No language interpreter was used.  Chest Pain Primary symptoms include shortness of breath.    Shortness of Breath  Associated symptoms include chest pain and shortness of breath.   Patricia Williamson is a 26 y.o. female who presents to the Emergency Department complaining of mild constant CP with associated SOB, onset one hour ago. Patient states she was moving  and cleaning up when she saw her daughter found a taser. She says when she reached for the taser she accidentally tazed herself in the left arm causing cp and sob. She states she suddenly also felt weak , but reports no mark noted to left arm. Pt has hx of asthma. Past Medical History  Diagnosis Date  . Asthma     Past Surgical History  Procedure Date  . Cystoscopy w/ ureteral stent placement     History reviewed. No pertinent family history.  History  Substance Use Topics  . Smoking status: Current Every Day Smoker    Types: Cigarettes  . Smokeless tobacco: Not on file  . Alcohol Use: No   Review of Systems  Respiratory: Positive for shortness of breath.   Cardiovascular: Positive for chest pain.   10 Systems reviewed and are negative for acute change except as noted in the HPI. Allergies  Sulfa antibiotics  Home Medications   Current Outpatient Rx  Name Route Sig Dispense Refill  . ETONOGESTREL 68 MG [place] IMPL Subcutaneous Inject 1 each into the skin once.    . IBUPROFEN 600 MG PO TABS  One PO TID 30 tablet 0    BP 142/81  Pulse  84  Temp 98.2 F (36.8 C) (Oral)  Resp 20  Ht 5' (1.524 m)  Wt 180 lb (81.647 kg)  BMI 35.15 kg/m2  SpO2 100%  Physical Exam  Nursing note and vitals reviewed. Constitutional: She is oriented to person, place, and time. She appears well-developed and well-nourished. No distress.  HENT:  Head: Normocephalic and atraumatic.  Eyes: Conjunctivae normal and EOM are normal. Pupils are equal, round, and reactive to light.  Neck: Normal range of motion. Neck supple. No tracheal deviation present.  Cardiovascular: Normal rate and regular rhythm.   Pulmonary/Chest: Effort normal. No respiratory distress.  Abdominal: Soft. She exhibits no distension. There is no tenderness.  Musculoskeletal: Normal range of motion.  Neurological: She is alert and oriented to person, place, and time. No cranial nerve deficit or sensory deficit.  Skin: Skin is warm and dry.  Psychiatric: She has a normal mood and affect. Her behavior is normal.    ED Course  Procedures (including critical care time) DIAGNOSTIC STUDIES: Oxygen Saturation is 100% on room air, normal by my interpretation.    COORDINATION OF CARE: 20:03: Physical exam performed.  Labs Reviewed - No data to display No results found.   No diagnosis found.   Date: 02/20/2012  Rate: 80  Rhythm: normal sinus rhythm  QRS Axis: normal  Intervals: normal  ST/T Wave abnormalities: normal  Conduction Disutrbances:none  Narrative Interpretation:  Old EKG Reviewed: none available    MDM  The patient presents here for eval after inadvertently tasing herself while trying to take the device from her daughter.  She felt as though she had the wind knocked out of her and she couldn't breathe for a short period of time.  She is here because she is afraid her heart was knocked out of rhythm.  The ekg shows a normal sinus rhythm without ectopy, her oxygen sats are 100% and the lungs are clear and equal.  I see no mark where she says the taser shocked  her.     I personally performed the services described in this documentation, which was scribed in my presence. The recorded information has been reviewed and considered.           Geoffery Lyons, MD 02/20/12 2219

## 2012-02-20 NOTE — ED Notes (Signed)
Pt accidentally tazed herself in her left arm. Now c/o chest pain and sob

## 2012-07-21 ENCOUNTER — Other Ambulatory Visit: Payer: Self-pay | Admitting: Adult Health

## 2012-07-21 ENCOUNTER — Other Ambulatory Visit (HOSPITAL_COMMUNITY)
Admission: RE | Admit: 2012-07-21 | Discharge: 2012-07-21 | Disposition: A | Discharge status: Home / Self Care | Payer: Self-pay | Source: Ambulatory Visit | Attending: Obstetrics and Gynecology | Admitting: Obstetrics and Gynecology

## 2012-07-21 DIAGNOSIS — Z01419 Encounter for gynecological examination (general) (routine) without abnormal findings: Secondary | ICD-10-CM | POA: Insufficient documentation

## 2012-09-06 ENCOUNTER — Encounter (HOSPITAL_COMMUNITY): Payer: Self-pay | Admitting: Emergency Medicine

## 2012-09-06 ENCOUNTER — Emergency Department (HOSPITAL_COMMUNITY)
Admission: EM | Admit: 2012-09-06 | Discharge: 2012-09-06 | Disposition: A | Discharge status: Home / Self Care | Payer: Medicaid Other | Attending: Emergency Medicine | Admitting: Emergency Medicine

## 2012-09-06 ENCOUNTER — Emergency Department (HOSPITAL_COMMUNITY): Payer: Medicaid Other

## 2012-09-06 ENCOUNTER — Telehealth: Payer: Self-pay | Admitting: Adult Health

## 2012-09-06 DIAGNOSIS — M545 Low back pain, unspecified: Secondary | ICD-10-CM | POA: Insufficient documentation

## 2012-09-06 DIAGNOSIS — N946 Dysmenorrhea, unspecified: Secondary | ICD-10-CM

## 2012-09-06 DIAGNOSIS — R109 Unspecified abdominal pain: Secondary | ICD-10-CM | POA: Insufficient documentation

## 2012-09-06 DIAGNOSIS — R350 Frequency of micturition: Secondary | ICD-10-CM | POA: Insufficient documentation

## 2012-09-06 DIAGNOSIS — J45909 Unspecified asthma, uncomplicated: Secondary | ICD-10-CM | POA: Insufficient documentation

## 2012-09-06 DIAGNOSIS — F17200 Nicotine dependence, unspecified, uncomplicated: Secondary | ICD-10-CM | POA: Insufficient documentation

## 2012-09-06 DIAGNOSIS — R51 Headache: Secondary | ICD-10-CM | POA: Insufficient documentation

## 2012-09-06 DIAGNOSIS — R059 Cough, unspecified: Secondary | ICD-10-CM | POA: Insufficient documentation

## 2012-09-06 DIAGNOSIS — Z3202 Encounter for pregnancy test, result negative: Secondary | ICD-10-CM | POA: Insufficient documentation

## 2012-09-06 DIAGNOSIS — R05 Cough: Secondary | ICD-10-CM | POA: Insufficient documentation

## 2012-09-06 DIAGNOSIS — Z79899 Other long term (current) drug therapy: Secondary | ICD-10-CM | POA: Insufficient documentation

## 2012-09-06 LAB — CBC WITH DIFFERENTIAL/PLATELET
Basophils Absolute: 0.1 10*3/uL (ref 0.0–0.1)
Basophils Relative: 1 % (ref 0–1)
HCT: 44.8 % (ref 36.0–46.0)
MCHC: 35.3 g/dL (ref 30.0–36.0)
Monocytes Absolute: 0.7 10*3/uL (ref 0.1–1.0)
Neutro Abs: 7.6 10*3/uL (ref 1.7–7.7)
Neutrophils Relative %: 72 % (ref 43–77)
Platelets: 200 10*3/uL (ref 150–400)
RDW: 13.3 % (ref 11.5–15.5)

## 2012-09-06 LAB — URINALYSIS, ROUTINE W REFLEX MICROSCOPIC
Bilirubin Urine: NEGATIVE
Glucose, UA: NEGATIVE mg/dL
Ketones, ur: NEGATIVE mg/dL
pH: 6.5 (ref 5.0–8.0)

## 2012-09-06 LAB — WET PREP, GENITAL
Trich, Wet Prep: NONE SEEN
WBC, Wet Prep HPF POC: NONE SEEN
Yeast Wet Prep HPF POC: NONE SEEN

## 2012-09-06 LAB — URINE MICROSCOPIC-ADD ON

## 2012-09-06 MED ORDER — IBUPROFEN 600 MG PO TABS: 600.0000 mg | ORAL_TABLET | Freq: Four times a day (QID) | ORAL | Status: DC | PRN

## 2012-09-06 NOTE — ED Notes (Signed)
Pt states light bleeding yesterday, pt having heavy vaginal bleeding this am. Pt states she never has a period. Pt had implant and had removed two months ago. Pt unknown if pregnant.

## 2012-09-06 NOTE — ED Provider Notes (Signed)
History     CSN: [id number]  Arrival date & time 09/06/12  4098   First MD Initiated Contact with Patient 09/06/12 [phone]      Chief Complaint  Patient presents with  . Vaginal Bleeding    (Consider location/radiation/quality/duration/timing/severity/associated sxs/prior treatment) Patient is a 27 y.o. female presenting with vaginal bleeding. The history is provided by the patient.  Vaginal Bleeding This is a new problem. The current episode started yesterday. The problem occurs every several days. The problem has been gradually worsening. Associated symptoms include abdominal pain, coughing and headaches. Pertinent negatives include no chest pain, chills, fever, nausea, rash or vomiting. Nothing aggravates the symptoms. She has tried nothing for the symptoms.    Patricia Williamson is a 26 y.o. female who presents to the ED with vaginal bleeding. The bleeding started yesterday as spotting. Today woke and had a pool of blood. LMP years ago. Never even had a period when before getting pregnant. Has been on Norplant in the past but it has been out for 2 months. Associated symptoms include abdominal pain that she describes as cramping, sharp pain that radiates to the lower back. Last sexual intercourse 2 days ago. Gyn Care with Dr. Emelda Fear. The history was provided by the patient.  Past Medical History  Diagnosis Date  . Asthma     Past Surgical History  Procedure Laterality Date  . Cystoscopy w/ ureteral stent placement      History reviewed. No pertinent family history.  History  Substance Use Topics  . Smoking status: Current Every Day Smoker    Types: Cigarettes  . Smokeless tobacco: Not on file  . Alcohol Use: No    OB History   Grav Para Term Preterm Abortions TAB SAB Ect Mult Living                  Review of Systems  Constitutional: Negative for fever and chills.  Eyes: Negative for visual disturbance.  Respiratory: Positive for cough. Negative for shortness of breath  and wheezing.   Cardiovascular: Negative for chest pain and palpitations.  Gastrointestinal: Positive for abdominal pain. Negative for nausea, vomiting and diarrhea.  Genitourinary: Positive for frequency, vaginal bleeding and menstrual problem. Negative for dysuria.  Musculoskeletal: Positive for back pain.  Skin: Negative for rash.  Neurological: Positive for headaches. Negative for light-headedness.  Psychiatric/Behavioral: The patient is not nervous/anxious.     Allergies  Sulfa antibiotics  Home Medications   Current Outpatient Rx  Name  Route  Sig  Dispense  Refill  . etonogestrel (IMPLANON) 68 MG IMPL implant   Subcutaneous   Inject 1 each into the skin once.         Marland Kitchen ibuprofen (ADVIL,MOTRIN) 200 MG tablet   Oral   Take 800 mg by mouth every 8 (eight) hours as needed. pain         . megestrol (MEGACE) 40 MG tablet   Oral   Take 40 mg by mouth daily.           BP 135/65  Pulse 64  Temp(Src) 98.2 F (36.8 C) (Oral)  Resp 16  Ht 5\' 1"  (1.549 m)  Wt 180 lb (81.647 kg)  BMI 34.03 kg/m2  SpO2 100%  Physical Exam  Nursing note and vitals reviewed. Constitutional: She is oriented to person, place, and time. She appears well-developed and well-nourished.  HENT:  Head: Normocephalic and atraumatic.  Eyes: EOM are normal.  Neck: Neck supple.  Cardiovascular: Normal rate and regular  rhythm.   Pulmonary/Chest: Effort normal and breath sounds normal. No respiratory distress.  Abdominal: Soft. There is no tenderness.  Genitourinary:  External genitalia without lesions. Moderate blood vaginal vault. Cervix closed, mild CMT, bilateral adnexal tenderness, uterus without palpable enlargement.  Musculoskeletal: Normal range of motion.  Neurological: She is alert and oriented to person, place, and time. No cranial nerve deficit.  Skin: Skin is warm and dry.   Results for orders placed during the hospital encounter of 09/06/12 (from the past 24 hour(s))  URINALYSIS,  ROUTINE W REFLEX MICROSCOPIC     Status: Abnormal   Collection Time    09/06/12 10:00 AM      Result Value Range   Color, Urine YELLOW  YELLOW   APPearance CLEAR  CLEAR   Specific Gravity, Urine 1.010  1.005 - 1.030   pH 6.5  5.0 - 8.0   Glucose, UA NEGATIVE  NEGATIVE mg/dL   Hgb urine dipstick SMALL (*) NEGATIVE   Bilirubin Urine NEGATIVE  NEGATIVE   Ketones, ur NEGATIVE  NEGATIVE mg/dL   Protein, ur NEGATIVE  NEGATIVE mg/dL   Urobilinogen, UA 0.2  0.0 - 1.0 mg/dL   Nitrite NEGATIVE  NEGATIVE   Leukocytes, UA NEGATIVE  NEGATIVE  PREGNANCY, URINE     Status: None   Collection Time    09/06/12 10:00 AM      Result Value Range   Preg Test, Ur NEGATIVE  NEGATIVE  URINE MICROSCOPIC-ADD ON     Status: None   Collection Time    09/06/12 10:00 AM      Result Value Range   RBC / HPF 0-2  <3 RBC/hpf  CBC WITH DIFFERENTIAL     Status: Abnormal   Collection Time    09/06/12 10:09 AM      Result Value Range   WBC 10.6 (*) 4.0 - 10.5 K/uL   RBC 5.23 (*) 3.87 - 5.11 MIL/uL   Hemoglobin 15.8 (*) 12.0 - 15.0 g/dL   HCT 16.1  09.6 - 04.5 %   MCV 85.7  78.0 - 100.0 fL   MCH 30.2  26.0 - 34.0 pg   MCHC 35.3  30.0 - 36.0 g/dL   RDW 40.9  81.1 - 91.4 %   Platelets 200  150 - 400 K/uL   Neutrophils Relative 72  43 - 77 %   Neutro Abs 7.6  1.7 - 7.7 K/uL   Lymphocytes Relative 20  12 - 46 %   Lymphs Abs 2.1  0.7 - 4.0 K/uL   Monocytes Relative 7  3 - 12 %   Monocytes Absolute 0.7  0.1 - 1.0 K/uL   Eosinophils Relative 1  0 - 5 %   Eosinophils Absolute 0.1  0.0 - 0.7 K/uL   Basophils Relative 1  0 - 1 %   Basophils Absolute 0.1  0.0 - 0.1 K/uL  HCG, QUANTITATIVE, PREGNANCY     Status: None   Collection Time    09/06/12 10:15 AM      Result Value Range   hCG, Beta Chain, Quant, S <1  <5 mIU/mL  ABO/RH     Status: None   Collection Time    09/06/12 10:15 AM      Result Value Range   ABO/RH(D) A POS    WET PREP, GENITAL     Status: Abnormal   Collection Time    09/06/12 10:25 AM       Result Value Range   Yeast Wet Prep HPF  POC NONE SEEN  NONE SEEN   Trich, Wet Prep NONE SEEN  NONE SEEN   Clue Cells Wet Prep HPF POC FEW (*) NONE SEEN   WBC, Wet Prep HPF POC NONE SEEN  NONE SEEN    ED Course  Procedures (including critical care time) US Transvaginal Non-ob  09/06/2012  *RADIOLOGY REPORT*  Clinical Data: Pelvic pain, abnormal vaginal bleeding.  The patient reports lack of irregular menstrual periods.  TRANSABDOMINAL AND TRANSVAGINAL ULTRASOUND OF PELVIS Technique:  Both transabdominal and transvaginal ultrasound examinations of the pelvis were performed. Transabdominal technique was performed for global imaging of the pelvis including uterus, ovaries, adnexal regions, and pelvic cul-de-sac.  It was necessary to proceed with endovaginal exam following the transabdominal exam to visualize the endometrium and ovaries.  Comparison:  CT 11/10/2006  Findings:  Uterus: Normal in size and mildly heterogeneous in echotexture measuring 7.0 x 3.0 x 4.4 cm.  Endometrium: Endometrium is thickened to 8 mm with which is within normal limits for premenopausal female.  There is mild tenting of endometrium anteriorly which may represent a cesarean section scar.  Right ovary:  Normal in size and echogenicity with small follicles measuring 3.8 x 2.6 x 2.0 cm.  Left ovary: Normal in size and echogenicity with small follicles measuring 3.6 x 2.0 x 2.8 cm.  Other findings: Small amount of free fluid posterior to the uterine fundus.  IMPRESSION:  1.  Normal ovaries. 2.  Endometrium measures 8 mm which is within normal limits for premenopausal female.   Original Report Authenticated By: Genevive Bi, M.D.    US Pelvis Complete  09/06/2012  *RADIOLOGY REPORT*  Clinical Data: Pelvic pain, abnormal vaginal bleeding.  The patient reports lack of irregular menstrual periods.  TRANSABDOMINAL AND TRANSVAGINAL ULTRASOUND OF PELVIS Technique:  Both transabdominal and transvaginal ultrasound examinations of the  pelvis were performed. Transabdominal technique was performed for global imaging of the pelvis including uterus, ovaries, adnexal regions, and pelvic cul-de-sac.  It was necessary to proceed with endovaginal exam following the transabdominal exam to visualize the endometrium and ovaries.  Comparison:  CT 11/10/2006  Findings:  Uterus: Normal in size and mildly heterogeneous in echotexture measuring 7.0 x 3.0 x 4.4 cm.  Endometrium: Endometrium is thickened to 8 mm with which is within normal limits for premenopausal female.  There is mild tenting of endometrium anteriorly which may represent a cesarean section scar.  Right ovary:  Normal in size and echogenicity with small follicles measuring 3.8 x 2.6 x 2.0 cm.  Left ovary: Normal in size and echogenicity with small follicles measuring 3.6 x 2.0 x 2.8 cm.  Other findings: Small amount of free fluid posterior to the uterine fundus.  IMPRESSION:  1.  Normal ovaries. 2.  Endometrium measures 8 mm which is within normal limits for premenopausal female.   Original Report Authenticated By: Genevive Bi, M.D.     Assessment: 27 y.o. female with history of amenorrhea presents today with vaginal bleeding   Dysmenorrhea  Plan:  Ibuprofen Rx   Follow up with [facility], return here as needed  MDM  I have reviewed this patient's vital signs, nurses notes, appropriate labs and imaging.  I have discussed results and need for follow up. Patient voices understanding.     Medication List    TAKE these medications       ibuprofen 600 MG tablet  Commonly known as:  ADVIL,MOTRIN  Take 1 tablet (600 mg total) by mouth every 6 (six) hours as needed for  pain.               Janne Napoleon, NP 09/06/12 1235

## 2012-09-06 NOTE — Telephone Encounter (Signed)
No voice mail.

## 2012-09-07 ENCOUNTER — Telehealth: Payer: Self-pay | Admitting: Adult Health

## 2012-09-07 LAB — GC/CHLAMYDIA PROBE AMP: GC Probe RNA: NEGATIVE

## 2012-09-07 NOTE — Telephone Encounter (Signed)
Called pt. She started bleeding 4/21 and then yesterday it was heavier and she went to ER, this is the first period since removing implanon, told her to keep period calendar and let me know how the period goes, she does desire pregnancy in near future.she is agreeable.

## 2012-09-14 NOTE — ED Provider Notes (Signed)
Medical screening examination/treatment/procedure(s) were performed by non-physician practitioner and as supervising physician I was immediately available for consultation/collaboration.   Bunny Lowdermilk L Zayvon Alicea, MD 09/14/12 1610 

## 2012-11-14 ENCOUNTER — Encounter: Payer: Self-pay | Admitting: *Deleted

## 2012-11-15 ENCOUNTER — Encounter: Payer: Self-pay | Admitting: Obstetrics & Gynecology

## 2012-11-15 ENCOUNTER — Ambulatory Visit (INDEPENDENT_AMBULATORY_CARE_PROVIDER_SITE_OTHER): Payer: Medicaid Other | Admitting: Obstetrics & Gynecology

## 2012-11-15 VITALS — BP 130/60 | Ht 61.0 in | Wt 179.2 lb

## 2012-11-15 DIAGNOSIS — Z3201 Encounter for pregnancy test, result positive: Secondary | ICD-10-CM

## 2012-11-15 NOTE — Progress Notes (Signed)
Pt here for pregnancy test; positive result; no problems at this time.

## 2012-11-21 ENCOUNTER — Other Ambulatory Visit: Payer: Self-pay | Admitting: Obstetrics & Gynecology

## 2012-11-21 DIAGNOSIS — O3680X0 Pregnancy with inconclusive fetal viability, not applicable or unspecified: Secondary | ICD-10-CM

## 2012-11-22 ENCOUNTER — Ambulatory Visit (INDEPENDENT_AMBULATORY_CARE_PROVIDER_SITE_OTHER): Payer: Medicaid Other

## 2012-11-22 DIAGNOSIS — N926 Irregular menstruation, unspecified: Secondary | ICD-10-CM

## 2012-11-22 DIAGNOSIS — O3680X0 Pregnancy with inconclusive fetal viability, not applicable or unspecified: Secondary | ICD-10-CM

## 2012-11-22 NOTE — Progress Notes (Signed)
U/S-single IUP with +FCA noted, CRL c/w 8+1wks EDD 07/03/2013, cx long and closed, bilateral adnexa WNL

## 2012-11-29 ENCOUNTER — Ambulatory Visit (INDEPENDENT_AMBULATORY_CARE_PROVIDER_SITE_OTHER): Payer: Medicaid Other | Admitting: Women's Health

## 2012-11-29 ENCOUNTER — Encounter: Payer: Self-pay | Admitting: Women's Health

## 2012-11-29 VITALS — BP 114/58 | Wt 182.0 lb

## 2012-11-29 DIAGNOSIS — Z3480 Encounter for supervision of other normal pregnancy, unspecified trimester: Secondary | ICD-10-CM

## 2012-11-29 DIAGNOSIS — Z331 Pregnant state, incidental: Secondary | ICD-10-CM

## 2012-11-29 DIAGNOSIS — Z3481 Encounter for supervision of other normal pregnancy, first trimester: Secondary | ICD-10-CM

## 2012-11-29 DIAGNOSIS — Z1389 Encounter for screening for other disorder: Secondary | ICD-10-CM

## 2012-11-29 LAB — POCT URINALYSIS DIPSTICK
Glucose, UA: NEGATIVE
Ketones, UA: NEGATIVE

## 2012-11-29 LAB — CBC
Hemoglobin: 14.3 g/dL (ref 12.0–15.0)
MCHC: 34.1 g/dL (ref 30.0–36.0)
RDW: 13 % (ref 11.5–15.5)

## 2012-11-29 NOTE — Patient Instructions (Signed)
Nausea & Vomiting  Have saltine crackers or pretzels by your bed and eat a few bites before you raise your head out of bed in the morning  Eat small frequent meals throughout the day instead of large meals  Drink plenty of fluids throughout the day to stay hydrated, just don't drink a lot of fluids with your meals.  This can make your stomach fill up faster making you feel sick  Do not brush your teeth right after you eat  Products with real ginger are good for nausea, like ginger ale and ginger hard candy Make sure it says made with real ginger!  Sucking on sour candy like lemon heads is also good for nausea  If your prenatal vitamins make you nauseated, take them at night so you will sleep through the nausea  If you feel like you need medicine for the nausea & vomiting please let us know  If you are unable to keep any fluids or food down please let us know    Pregnancy - First Trimester During sexual intercourse, millions of sperm go into the vagina. Only 1 sperm will penetrate and fertilize the female egg while it is in the Fallopian tube. One week later, the fertilized egg implants into the wall of the uterus. An embryo begins to develop into a baby. At 6 to 8 weeks, the eyes and face are formed and the heartbeat can be seen on ultrasound. At the end of 12 weeks (first trimester), all the baby's organs are formed. Now that you are pregnant, you will want to do everything you can to have a healthy baby. Two of the most important things are to get good prenatal care and follow your caregiver's instructions. Prenatal care is all the medical care you receive before the baby's birth. It is given to prevent, find, and treat problems during the pregnancy and childbirth. PRENATAL EXAMS  During prenatal visits, your weight, blood pressure, and urine are checked. This is done to make sure you are healthy and progressing normally during the pregnancy.  A pregnant woman should gain 25 to 35 pounds  during the pregnancy. However, if you are overweight or underweight, your caregiver will advise you regarding your weight.  Your caregiver will ask and answer questions for you.  Blood work, cervical cultures, other necessary tests, and a Pap test are done during your prenatal exams. These tests are done to check on your health and the probable health of your baby. Tests are strongly recommended and done for HIV with your permission. This is the virus that causes AIDS. These tests are done because medicines can be given to help prevent your baby from being born with this infection should you have been infected without knowing it. Blood work is also used to find out your blood type, previous infections, and follow your blood levels (hemoglobin).  Low hemoglobin (anemia) is common during pregnancy. Iron and vitamins are given to help prevent this. Later in the pregnancy, blood tests for diabetes will be done along with any other tests if any problems develop.  You may need other tests to make sure you and the baby are doing well. CHANGES DURING THE FIRST TRIMESTER  Your body goes through many changes during pregnancy. They vary from person to person. Talk to your caregiver about changes you notice and are concerned about. Changes can include:  Your menstrual period stops.  The egg and sperm carry the genes that determine what you look like. Genes from you   and your partner are forming a baby. The female genes determine whether the baby is a boy or a girl.  Your body increases in girth and you may feel bloated.  Feeling sick to your stomach (nauseous) and throwing up (vomiting). If the vomiting is uncontrollable, call your caregiver.  Your breasts will begin to enlarge and become tender.  Your nipples may stick out more and become darker.  The need to urinate more. Painful urination may mean you have a bladder infection.  Tiring easily.  Loss of appetite.  Cravings for certain kinds of  food.  At first, you may gain or lose a couple of pounds.  You may have changes in your emotions from day to day (excited to be pregnant or concerned something may go wrong with the pregnancy and baby).  You may have more vivid and strange dreams. HOME CARE INSTRUCTIONS   It is very important to avoid all smoking, alcohol and non-prescribed drugs during your pregnancy. These affect the formation and growth of the baby. Avoid chemicals while pregnant to ensure the delivery of a healthy infant.  Start your prenatal visits by the 12th week of pregnancy. They are usually scheduled monthly at first, then more often in the last 2 months before delivery. Keep your caregiver's appointments. Follow your caregiver's instructions regarding medicine use, blood and lab tests, exercise, and diet.  During pregnancy, you are providing food for you and your baby. Eat regular, well-balanced meals. Choose foods such as meat, fish, milk and other low fat dairy products, vegetables, fruits, and whole-grain breads and cereals. Your caregiver will tell you of the ideal weight gain.  You can help morning sickness by keeping soda crackers at the bedside. Eat a couple before arising in the morning. You may want to use the crackers without salt on them.  Eating 4 to 5 small meals rather than 3 large meals a day also may help the nausea and vomiting.  Drinking liquids between meals instead of during meals also seems to help nausea and vomiting.  A physical sexual relationship may be continued throughout pregnancy if there are no other problems. Problems may be early (premature) leaking of amniotic fluid from the membranes, vaginal bleeding, or belly (abdominal) pain.  Exercise regularly if there are no restrictions. Check with your caregiver or physical therapist if you are unsure of the safety of some of your exercises. Greater weight gain will occur in the last 2 trimesters of pregnancy. Exercising will  help:  Control your weight.  Keep you in shape.  Prepare you for labor and delivery.  Help you lose your pregnancy weight after you deliver your baby.  Wear a good support or jogging bra for breast tenderness during pregnancy. This may help if worn during sleep too.  Ask when prenatal classes are available. Begin classes when they are offered.  Do not use hot tubs, steam rooms, or saunas.  Wear your seat belt when driving. This protects you and your baby if you are in an accident.  Avoid raw meat, uncooked cheese, cat litter boxes, and soil used by cats throughout the pregnancy. These carry germs that can cause birth defects in the baby.  The first trimester is a good time to visit your dentist for your dental health. Getting your teeth cleaned is okay. Use a softer toothbrush and brush gently during pregnancy.  Ask for help if you have financial, counseling, or nutritional needs during pregnancy. Your caregiver will be able to offer counseling for   these needs as well as refer you for other special needs.  Do not take any medicines or herbs unless told by your caregiver.  Inform your caregiver if there is any mental or physical domestic violence.  Make a list of emergency phone numbers of family, friends, hospital, and police and fire departments.  Write down your questions. Take them to your prenatal visit.  Do not douche.  Do not cross your legs.  If you have to stand for long periods of time, rotate you feet or take small steps in a circle.  You may have more vaginal secretions that may require a sanitary pad. Do not use tampons or scented sanitary pads. MEDICINES AND DRUG USE IN PREGNANCY  Take prenatal vitamins as directed. The vitamin should contain 1 milligram of folic acid. Keep all vitamins out of reach of children. Only a couple vitamins or tablets containing iron may be fatal to a baby or young child when ingested.  Avoid use of all medicines, including herbs,  over-the-counter medicines, not prescribed or suggested by your caregiver. Only take over-the-counter or prescription medicines for pain, discomfort, or fever as directed by your caregiver. Do not use aspirin, ibuprofen, or naproxen unless directed by your caregiver.  Let your caregiver also know about herbs you may be using.  Alcohol is related to a number of birth defects. This includes fetal alcohol syndrome. All alcohol, in any form, should be avoided completely. Smoking will cause low birth rate and premature babies.  Street or illegal drugs are very harmful to the baby. They are absolutely forbidden. A baby born to an addicted mother will be addicted at birth. The baby will go through the same withdrawal an adult does.  Let your caregiver know about any medicines that you have to take and for what reason you take them. SEEK MEDICAL CARE IF:  You have any concerns or worries during your pregnancy. It is better to call with your questions if you feel they cannot wait, rather than worry about them. SEEK IMMEDIATE MEDICAL CARE IF:   An unexplained oral temperature above 102 F (38.9 C) develops, or as your caregiver suggests.  You have leaking of fluid from the vagina (birth canal). If leaking membranes are suspected, take your temperature and inform your caregiver of this when you call.  There is vaginal spotting or bleeding. Notify your caregiver of the amount and how many pads are used.  You develop a bad smelling vaginal discharge with a change in the color.  You continue to feel sick to your stomach (nauseated) and have no relief from remedies suggested. You vomit blood or coffee ground-like materials.  You lose more than 2 pounds of weight in 1 week.  You gain more than 2 pounds of weight in 1 week and you notice swelling of your face, hands, feet, or legs.  You gain 5 pounds or more in 1 week (even if you do not have swelling of your hands, face, legs, or feet).  You get  exposed to German measles and have never had them.  You are exposed to fifth disease or chickenpox.  You develop belly (abdominal) pain. Round ligament discomfort is a common non-cancerous (benign) cause of abdominal pain in pregnancy. Your caregiver still must evaluate this.  You develop headache, fever, diarrhea, pain with urination, or shortness of breath.  You fall or are in a car accident or have any kind of trauma.  There is mental or physical violence in your home. Document   Released: 04/28/2001 Document Revised: 01/27/2012 Document Reviewed: 10/30/2008 [employer] Patient Information 2014 [employer].  

## 2012-11-29 NOTE — Progress Notes (Signed)
  Subjective:    Patricia Williamson is a 27 y.o. G71P1001 Caucasian female at [redacted]w[redacted]d by 8.1wk u/s, being seen today for her first obstetrical visit.  Her obstetrical history is significant for obesity and previous term uncomplicated SVD.  Pregnancy history fully reviewed. Normal pap in March 2014  Patient reports some nausea this am- first time since pregnant. Denies vb, cramping, uti s/s.   Filed Vitals:   11/29/12 1113  BP: 114/58  Weight: 182 lb (82.555 kg)    HISTORY: OB History   Grav Para Term Preterm Abortions TAB SAB Ect Mult Living   2 1 1       1      # Outc Date GA Lbr Len/2nd Wgt Sex Del Anes PTL Lv   1 TRM 8/11 [redacted]w[redacted]d  6lb5oz(2.863kg) F SVD EPI  Yes   2 CUR              Past Medical History  Diagnosis Date  . Asthma   . Hx of constipation    Past Surgical History  Procedure Laterality Date  . Cystoscopy w/ ureteral stent placement     Family History  Problem Relation Age of Onset  . Hypertension Mother   . Diabetes Mother   . Hypertension Brother   . Other Paternal Aunt     benign breast lump  . Diabetes Maternal Grandmother   . Heart disease Other      Exam   System:     Skin: normal coloration and turgor, no rashes    Neurologic: oriented, normal mood   Extremities: normal strength, tone, and muscle mass   HEENT PERRLA   Mouth/Teeth mucous membranes moist   Cardiovascular: regular rate and rhythm   Respiratory:  appears well, vitals normal, no respiratory distress, acyanotic, normal RR   Abdomen: soft, non-tender   FHR 140 via informal transabdominal u/s   Assessment:    Pregnancy: G2P1001 There are no active problems to display for this patient.     [redacted]w[redacted]d G2P1001 New OB visit Obesity Nausea of pregnancy   Plan:     Initial labs drawn Continue prenatal vitamins Problem list reviewed and updated Reviewed n/v relief measures and warning s/s to report, to call if needs antiemetic Reviewed recommended weight gain based on pre-gravid  BMI Encouraged well-balanced diet Genetic Screening discussed Integrated Screen: requested Cystic fibrosis screening discussed requested Ultrasound discussed; fetal survey: requested Follow up in 3 weeks for 1st IT/NT and visit [facility] completed and faxed   Marge Duncans 11/29/2012 11:40 AM

## 2012-11-29 NOTE — Progress Notes (Signed)
New OB packet given. Consents signed.  

## 2012-11-30 LAB — URINALYSIS
Bilirubin Urine: NEGATIVE
Hgb urine dipstick: NEGATIVE
Ketones, ur: NEGATIVE mg/dL
Protein, ur: NEGATIVE mg/dL
Urobilinogen, UA: 0.2 mg/dL (ref 0.0–1.0)

## 2012-11-30 LAB — RPR

## 2012-11-30 LAB — DRUG SCREEN, URINE, NO CONFIRMATION
Amphetamine Screen, Ur: NEGATIVE
Benzodiazepines.: NEGATIVE
Cocaine Metabolites: NEGATIVE
Marijuana Metabolite: NEGATIVE
Phencyclidine (PCP): NEGATIVE

## 2012-11-30 LAB — ABO AND RH

## 2012-11-30 LAB — HEPATITIS B SURFACE ANTIGEN: Hepatitis B Surface Ag: NEGATIVE

## 2012-11-30 LAB — ANTIBODY SCREEN: Antibody Screen: NEGATIVE

## 2012-12-01 ENCOUNTER — Encounter: Payer: Self-pay | Admitting: Women's Health

## 2012-12-01 DIAGNOSIS — Z283 Underimmunization status: Secondary | ICD-10-CM | POA: Insufficient documentation

## 2012-12-01 DIAGNOSIS — O9989 Other specified diseases and conditions complicating pregnancy, childbirth and the puerperium: Secondary | ICD-10-CM

## 2012-12-02 ENCOUNTER — Telehealth: Payer: Self-pay | Admitting: Obstetrics & Gynecology

## 2012-12-02 LAB — CYSTIC FIBROSIS DIAGNOSTIC STUDY

## 2012-12-03 ENCOUNTER — Encounter: Payer: Self-pay | Admitting: Women's Health

## 2012-12-05 ENCOUNTER — Other Ambulatory Visit: Payer: Self-pay | Admitting: Women's Health

## 2012-12-05 DIAGNOSIS — Z3481 Encounter for supervision of other normal pregnancy, first trimester: Secondary | ICD-10-CM

## 2012-12-05 MED ORDER — RELNATE DHA 28-1-200 MG PO CAPS: 1.0000 | ORAL_CAPSULE | Freq: Every day | ORAL | Status: DC

## 2012-12-20 ENCOUNTER — Other Ambulatory Visit: Payer: Self-pay | Admitting: Obstetrics & Gynecology

## 2012-12-20 ENCOUNTER — Encounter: Payer: Self-pay | Admitting: Adult Health

## 2012-12-20 ENCOUNTER — Ambulatory Visit (INDEPENDENT_AMBULATORY_CARE_PROVIDER_SITE_OTHER): Payer: Medicaid Other

## 2012-12-20 ENCOUNTER — Ambulatory Visit (INDEPENDENT_AMBULATORY_CARE_PROVIDER_SITE_OTHER): Payer: Medicaid Other | Admitting: Adult Health

## 2012-12-20 VITALS — BP 118/64 | Wt 183.0 lb

## 2012-12-20 DIAGNOSIS — Z1389 Encounter for screening for other disorder: Secondary | ICD-10-CM

## 2012-12-20 DIAGNOSIS — Z3480 Encounter for supervision of other normal pregnancy, unspecified trimester: Secondary | ICD-10-CM

## 2012-12-20 DIAGNOSIS — R319 Hematuria, unspecified: Secondary | ICD-10-CM

## 2012-12-20 DIAGNOSIS — Z36 Encounter for antenatal screening of mother: Secondary | ICD-10-CM

## 2012-12-20 DIAGNOSIS — Z331 Pregnant state, incidental: Secondary | ICD-10-CM

## 2012-12-20 DIAGNOSIS — Z3481 Encounter for supervision of other normal pregnancy, first trimester: Secondary | ICD-10-CM

## 2012-12-20 LAB — URINALYSIS
Bilirubin Urine: NEGATIVE
Ketones, ur: NEGATIVE mg/dL
Protein, ur: NEGATIVE mg/dL
Urobilinogen, UA: 0.2 mg/dL (ref 0.0–1.0)

## 2012-12-20 LAB — POCT URINALYSIS DIPSTICK
Ketones, UA: NEGATIVE
Protein, UA: NEGATIVE

## 2012-12-20 MED ORDER — NITROFURANTOIN MONOHYD MACRO 100 MG PO CAPS: 100.0000 mg | ORAL_CAPSULE | Freq: Two times a day (BID) | ORAL | Status: DC

## 2012-12-20 NOTE — Patient Instructions (Addendum)
Follow up in 4 weeks for 2nd IT Increase fluids and take macrobid Urinary Tract Infection Urinary tract infections (UTIs) can develop anywhere along your urinary tract. Your urinary tract is your body's drainage system for removing wastes and extra water. Your urinary tract includes two kidneys, two ureters, a bladder, and a urethra. Your kidneys are a pair of bean-shaped organs. Each kidney is about the size of your fist. They are located below your ribs, one on each side of your spine. CAUSES Infections are caused by microbes, which are microscopic organisms, including fungi, viruses, and bacteria. These organisms are so small that they can only be seen through a microscope. Bacteria are the microbes that most commonly cause UTIs. SYMPTOMS  Symptoms of UTIs may vary by age and gender of the patient and by the location of the infection. Symptoms in young women typically include a frequent and intense urge to urinate and a painful, burning feeling in the bladder or urethra during urination. Older women and men are more likely to be tired, shaky, and weak and have muscle aches and abdominal pain. A fever may mean the infection is in your kidneys. Other symptoms of a kidney infection include pain in your back or sides below the ribs, nausea, and vomiting. DIAGNOSIS To diagnose a UTI, your caregiver will ask you about your symptoms. Your caregiver also will ask to provide a urine sample. The urine sample will be tested for bacteria and white blood cells. White blood cells are made by your body to help fight infection. TREATMENT  Typically, UTIs can be treated with medication. Because most UTIs are caused by a bacterial infection, they usually can be treated with the use of antibiotics. The choice of antibiotic and length of treatment depend on your symptoms and the type of bacteria causing your infection. HOME CARE INSTRUCTIONS  If you were prescribed antibiotics, take them exactly as your caregiver  instructs you. Finish the medication even if you feel better after you have only taken some of the medication.  Drink enough water and fluids to keep your urine clear or pale yellow.  Avoid caffeine, tea, and carbonated beverages. They tend to irritate your bladder.  Empty your bladder often. Avoid holding urine for long periods of time.  Empty your bladder before and after sexual intercourse.  After a bowel movement, women should cleanse from front to back. Use each tissue only once. SEEK MEDICAL CARE IF:   You have back pain.  You develop a fever.  Your symptoms do not begin to resolve within 3 days. SEEK IMMEDIATE MEDICAL CARE IF:   You have severe back pain or lower abdominal pain.  You develop chills.  You have nausea or vomiting.  You have continued burning or discomfort with urination. MAKE SURE YOU:   Understand these instructions.  Will watch your condition.  Will get help right away if you are not doing well or get worse. Document Released: 02/11/2005 Document Revised: 11/03/2011 Document Reviewed: 06/12/2011 [facility] Patient Information 2014 [place], [place].

## 2012-12-20 NOTE — Progress Notes (Signed)
U/S(12+1wks)- single active fetus, CRL c/w dates, cx long and closed, bilateral adnexa wnl, NB present, NT=1.75mm

## 2012-12-20 NOTE — Progress Notes (Signed)
Has been peeing more and low back, not sleeping well, ok to take tylenol and discussed sleep hygiene. Will rx macrobid 1 bid x 7 days and get UA C&S. Denies any bleeding and cervix was long and closed on [place].Return in 4 weeks for second IT.

## 2012-12-20 NOTE — Progress Notes (Signed)
Low backpain and urinary frequency. Also, trouble sleeping at night.

## 2012-12-22 LAB — URINE CULTURE: Colony Count: NO GROWTH

## 2013-01-17 ENCOUNTER — Encounter: Payer: Self-pay | Admitting: Women's Health

## 2013-01-17 ENCOUNTER — Other Ambulatory Visit: Payer: Self-pay | Admitting: Women's Health

## 2013-01-17 ENCOUNTER — Ambulatory Visit (INDEPENDENT_AMBULATORY_CARE_PROVIDER_SITE_OTHER): Payer: Medicaid Other | Admitting: Women's Health

## 2013-01-17 VITALS — BP 120/64 | Wt 183.5 lb

## 2013-01-17 DIAGNOSIS — Z3480 Encounter for supervision of other normal pregnancy, unspecified trimester: Secondary | ICD-10-CM

## 2013-01-17 DIAGNOSIS — Z331 Pregnant state, incidental: Secondary | ICD-10-CM

## 2013-01-17 DIAGNOSIS — Z3482 Encounter for supervision of other normal pregnancy, second trimester: Secondary | ICD-10-CM

## 2013-01-17 DIAGNOSIS — Z1389 Encounter for screening for other disorder: Secondary | ICD-10-CM

## 2013-01-17 LAB — POCT URINALYSIS DIPSTICK
Blood, UA: NEGATIVE
Ketones, UA: NEGATIVE

## 2013-01-17 NOTE — Patient Instructions (Signed)
Migraine Headache A migraine headache is an intense, throbbing pain on one or both sides of your head. A migraine can last for 30 minutes to several hours. CAUSES  The exact cause of a migraine headache is not always known. However, a migraine may be caused when nerves in the brain become irritated and release chemicals that cause inflammation. This causes pain. SYMPTOMS  Pain on one or both sides of your head.  Pulsating or throbbing pain.  Severe pain that prevents daily activities.  Pain that is aggravated by any physical activity.  Nausea, vomiting, or both.  Dizziness.  Pain with exposure to bright lights, loud noises, or activity.  General sensitivity to bright lights, loud noises, or smells. Before you get a migraine, you may get warning signs that a migraine is coming (aura). An aura may include:  Seeing flashing lights.  Seeing bright spots, halos, or zig-zag lines.  Having tunnel vision or blurred vision.  Having feelings of numbness or tingling.  Having trouble talking.  Having muscle weakness. MIGRAINE TRIGGERS  Alcohol.  Smoking.  Stress.  Menstruation.  Aged cheeses.  Foods or drinks that contain nitrates, glutamate, aspartame, or tyramine.  Lack of sleep.  Chocolate.  Caffeine.  Hunger.  Physical exertion.  Fatigue.  Medicines used to treat chest pain (nitroglycerine), birth control pills, estrogen, and some blood pressure medicines. DIAGNOSIS  A migraine headache is often diagnosed based on:  Symptoms.  Physical examination.  A CT scan or MRI of your head. TREATMENT Medicines may be given for pain and nausea. Medicines can also be given to help prevent recurrent migraines.  HOME CARE INSTRUCTIONS  Only take over-the-counter or prescription medicines for pain or discomfort as directed by your caregiver. The use of long-term narcotics is not recommended.  Lie down in a dark, quiet room when you have a migraine.  Keep a journal  to find out what may trigger your migraine headaches. For example, write down:  What you eat and drink.  How much sleep you get.  Any change to your diet or medicines.  Limit alcohol consumption.  Quit smoking if you smoke.  Get 7 to 9 hours of sleep, or as recommended by your caregiver.  Limit stress.  Keep lights dim if bright lights bother you and make your migraines worse. SEEK IMMEDIATE MEDICAL CARE IF:   Your migraine becomes severe.  You have a fever.  You have a stiff neck.  You have vision loss.  You have muscular weakness or loss of muscle control.  You start losing your balance or have trouble walking.  You feel faint or pass out.  You have severe symptoms that are different from your first symptoms. MAKE SURE YOU:   Understand these instructions.  Will watch your condition.  Will get help right away if you are not doing well or get worse. Document Released: 05/04/2005 Document Revised: 07/27/2011 Document Reviewed: 04/24/2011 [employer] Patient Information 2014 [employer].  

## 2013-01-17 NOTE — Progress Notes (Signed)
Reports good fm. Denies uc's, lof, vb, urinary frequency, urgency, hesitancy, or dysuria.  Headaches, hasn't taken anything.  Reviewed ha prevention/relief measures, warning s/s to report.  All questions answered. F/U in 4wks for anatomy u/s and visit.

## 2013-01-25 LAB — MATERNAL SCREEN, INTEGRATED #2
Age risk Down Syndrome: 1:1000 {titer}
Calculated Gestational Age: 16
Inhibin A Dimeric: 223 pg/mL
Inhibin A MoM: 1.4
MSS Down Syndrome: <1:5000 {titer}
MSS Trisomy 18 Risk: <1:5000 {titer}
NT MoM: 1.04
Rish for ONTD: 1:2900 {titer}
hCG MoM: 0.29

## 2013-01-28 ENCOUNTER — Encounter: Payer: Self-pay | Admitting: Women's Health

## 2013-02-14 ENCOUNTER — Ambulatory Visit (INDEPENDENT_AMBULATORY_CARE_PROVIDER_SITE_OTHER): Payer: Medicaid Other | Admitting: Advanced Practice Midwife

## 2013-02-14 ENCOUNTER — Encounter: Payer: Self-pay | Admitting: Advanced Practice Midwife

## 2013-02-14 ENCOUNTER — Ambulatory Visit (INDEPENDENT_AMBULATORY_CARE_PROVIDER_SITE_OTHER): Payer: Medicaid Other

## 2013-02-14 VITALS — BP 120/70 | Wt 182.5 lb

## 2013-02-14 DIAGNOSIS — Z1389 Encounter for screening for other disorder: Secondary | ICD-10-CM

## 2013-02-14 DIAGNOSIS — Z331 Pregnant state, incidental: Secondary | ICD-10-CM

## 2013-02-14 DIAGNOSIS — Z3482 Encounter for supervision of other normal pregnancy, second trimester: Secondary | ICD-10-CM

## 2013-02-14 DIAGNOSIS — Z3480 Encounter for supervision of other normal pregnancy, unspecified trimester: Secondary | ICD-10-CM

## 2013-02-14 LAB — POCT URINALYSIS DIPSTICK
Blood, UA: NEGATIVE
Ketones, UA: NEGATIVE
Nitrite, UA: NEGATIVE

## 2013-02-14 NOTE — Progress Notes (Signed)
Pt here today for routine visit and [place]. Pt denies any problems or concerns at this time. Eats but feels like PNV may decrease her appetite.  May try OTC gummy  PNV. F/u 4 weeks for LROB

## 2013-02-14 NOTE — Progress Notes (Signed)
U/S(20+1wks)-active fetus, meas c/w dates, cx long and closed 4.2cm, fluid wnl, post gr 0 plac, bilateral adnexa wnl, no major abnl noted, female fetus, FHR=149 bpm

## 2013-02-20 ENCOUNTER — Telehealth: Payer: Self-pay | Admitting: Obstetrics and Gynecology

## 2013-02-20 ENCOUNTER — Telehealth: Payer: Self-pay | Admitting: Obstetrics & Gynecology

## 2013-02-20 DIAGNOSIS — Z3482 Encounter for supervision of other normal pregnancy, second trimester: Secondary | ICD-10-CM

## 2013-02-20 NOTE — Telephone Encounter (Signed)
Spoke with pt. Advised Robitussin DM is safe to take. Gave dosage, 2 tsp every 6-8 hours, not to exceed 6 tsp in 24 hours. Pt voiced understanding. JSY

## 2013-02-20 NOTE — Telephone Encounter (Signed)
Spoke with pt. Pt had already spoke with another nurse and was advised to take robitussin DM.

## 2013-02-22 ENCOUNTER — Ambulatory Visit (INDEPENDENT_AMBULATORY_CARE_PROVIDER_SITE_OTHER): Payer: Medicaid Other | Admitting: Obstetrics and Gynecology

## 2013-02-22 ENCOUNTER — Telehealth: Payer: Self-pay | Admitting: Obstetrics and Gynecology

## 2013-02-22 VITALS — BP 136/80 | Temp 98.1°F | Wt 184.0 lb

## 2013-02-22 DIAGNOSIS — J45901 Unspecified asthma with (acute) exacerbation: Secondary | ICD-10-CM

## 2013-02-22 DIAGNOSIS — O9989 Other specified diseases and conditions complicating pregnancy, childbirth and the puerperium: Secondary | ICD-10-CM

## 2013-02-22 DIAGNOSIS — J209 Acute bronchitis, unspecified: Secondary | ICD-10-CM

## 2013-02-22 MED ORDER — HYDROCODONE-HOMATROPINE 5-1.5 MG/5ML PO SYRP: 5.0000 mL | ORAL_SOLUTION | Freq: Four times a day (QID) | ORAL | Status: DC | PRN

## 2013-02-22 MED ORDER — ALBUTEROL SULFATE HFA 108 (90 BASE) MCG/ACT IN AERS: 2.0000 | INHALATION_SPRAY | Freq: Four times a day (QID) | RESPIRATORY_TRACT | Status: DC | PRN

## 2013-02-22 MED ORDER — AZITHROMYCIN 250 MG PO TABS: 250.0000 mg | ORAL_TABLET | Freq: Every day | ORAL | Status: DC

## 2013-02-22 NOTE — Progress Notes (Signed)
Pt here today for cold symptoms. Pt states she has cough, wheezing, and headache. Pt denies any fever and body aches. Pt states she has had the symptoms since Sunday.

## 2013-02-22 NOTE — Telephone Encounter (Signed)
Pt coming in to be seen today ?

## 2013-02-22 NOTE — Patient Instructions (Signed)
Notify us if not better in 2 days.

## 2013-02-22 NOTE — Progress Notes (Signed)
Hx asthma;  Chest tighness and expiratory wheezing x 3 days, no inhalers used, no fever no chill, + cough, nonproductive. Chest not tight, slight exp wheeze with normal i/E ratios. No ronchi A: bronchitis, asthma P: z-pack, albuterol inhaler refil x 2, hycodan

## 2013-03-06 ENCOUNTER — Other Ambulatory Visit: Payer: Self-pay | Admitting: Women's Health

## 2013-03-06 ENCOUNTER — Inpatient Hospital Stay (HOSPITAL_COMMUNITY)
Admission: AD | Admit: 2013-03-06 | Discharge: 2013-03-06 | Disposition: A | Discharge status: Home / Self Care | Payer: Medicaid Other | Source: Ambulatory Visit | Attending: Obstetrics & Gynecology | Admitting: Obstetrics & Gynecology

## 2013-03-06 ENCOUNTER — Encounter (HOSPITAL_COMMUNITY): Payer: Self-pay | Admitting: General Practice

## 2013-03-06 ENCOUNTER — Telehealth: Payer: Self-pay | Admitting: *Deleted

## 2013-03-06 DIAGNOSIS — Z3482 Encounter for supervision of other normal pregnancy, second trimester: Secondary | ICD-10-CM

## 2013-03-06 DIAGNOSIS — M62838 Other muscle spasm: Secondary | ICD-10-CM

## 2013-03-06 DIAGNOSIS — O368190 Decreased fetal movements, unspecified trimester, not applicable or unspecified: Secondary | ICD-10-CM | POA: Insufficient documentation

## 2013-03-06 DIAGNOSIS — O219 Vomiting of pregnancy, unspecified: Secondary | ICD-10-CM

## 2013-03-06 DIAGNOSIS — R109 Unspecified abdominal pain: Secondary | ICD-10-CM | POA: Insufficient documentation

## 2013-03-06 DIAGNOSIS — N949 Unspecified condition associated with female genital organs and menstrual cycle: Secondary | ICD-10-CM

## 2013-03-06 DIAGNOSIS — R1012 Left upper quadrant pain: Secondary | ICD-10-CM | POA: Insufficient documentation

## 2013-03-06 DIAGNOSIS — O212 Late vomiting of pregnancy: Secondary | ICD-10-CM | POA: Insufficient documentation

## 2013-03-06 DIAGNOSIS — M549 Dorsalgia, unspecified: Secondary | ICD-10-CM | POA: Insufficient documentation

## 2013-03-06 LAB — WET PREP, GENITAL
Clue Cells Wet Prep HPF POC: NONE SEEN
Trich, Wet Prep: NONE SEEN
Yeast Wet Prep HPF POC: NONE SEEN

## 2013-03-06 LAB — URINE MICROSCOPIC-ADD ON

## 2013-03-06 LAB — URINALYSIS, ROUTINE W REFLEX MICROSCOPIC
Bilirubin Urine: NEGATIVE
Specific Gravity, Urine: 1.02 (ref 1.005–1.030)
pH: 7 (ref 5.0–8.0)

## 2013-03-06 MED ORDER — CYCLOBENZAPRINE HCL 5 MG PO TABS
5.0000 mg | ORAL_TABLET | Freq: Once | ORAL | Status: AC
  Administered 2013-03-06: 5 mg via ORAL
  Filled 2013-03-06: qty 1

## 2013-03-06 MED ORDER — PROMETHAZINE HCL 25 MG PO TABS: 25.0000 mg | ORAL_TABLET | Freq: Four times a day (QID) | ORAL | Status: DC | PRN

## 2013-03-06 MED ORDER — ONDANSETRON 4 MG PO TBDP: 4.0000 mg | ORAL_TABLET | Freq: Four times a day (QID) | ORAL | Status: DC | PRN

## 2013-03-06 MED ORDER — CYCLOBENZAPRINE HCL 10 MG PO TABS: 5.0000 mg | ORAL_TABLET | Freq: Three times a day (TID) | ORAL | Status: DC | PRN

## 2013-03-06 NOTE — MAU Note (Signed)
Pt has c/o recent back pain since Friday. Saturday a feeling of pain in abdomen when getting up, head started hurting, can't shake headache.

## 2013-03-06 NOTE — MAU Provider Note (Signed)
Chief Complaint:  Back Pain, Abdominal Pain, Decreased Fetal Movement and Headache   Patricia Williamson is a 27 y.o.  G2P1001 with IUP at [redacted]w[redacted]d presenting for Back Pain, Abdominal Pain, Decreased Fetal Movement and Headache  Patient reports onset of nausea and vomiting on Friday. Saturday she then developed headache that has persisted despite Tylenol and LUQ abdominal pain. She reports a past history of migraines however states they have never persisted this long. Today she reports onset of lower abdominal cramping and pressure and low back pain. She states the pain is constant and does not feel like contractions. She denies vaginal bleeding and LOF. She denies contractions. She reports intermittent fetal movement. She denies fevers, chills, diarrhea, chest pain, SOB, dysuria, flank pain, unusual vaginal discharge, vaginal itching and vision changes.   She receives her prenatal care at [facility].    Menstrual History: OB History   Grav Para Term Preterm Abortions TAB SAB Ect Mult Living   2 1 1       1        Patient's last menstrual period was 08/12/2012.      Past Medical History  Diagnosis Date  . Asthma   . Hx of constipation     Past Surgical History  Procedure Laterality Date  . Cystoscopy w/ ureteral stent placement      Family History  Problem Relation Age of Onset  . Hypertension Mother   . Diabetes Mother   . Hyperlipidemia Mother   . Hypertension Brother   . Other Paternal Aunt     benign breast lump  . Diabetes Maternal Grandmother   . Heart disease Maternal Grandmother     History  Substance Use Topics  . Smoking status: Former Smoker    Types: Cigarettes  . Smokeless tobacco: Never Used  . Alcohol Use: Yes     Comment: occassional; not now      Allergies  Allergen Reactions  . Vinegar [Acetic Acid] Hives and Itching  . Sulfa Antibiotics Rash    Prescriptions prior to admission  Medication Sig Dispense Refill  . acetaminophen (TYLENOL) 500 MG  tablet Take 1,000 mg by mouth every 6 (six) hours as needed for pain (For headache.).      Marland Kitchen albuterol (PROVENTIL HFA;VENTOLIN HFA) 108 (90 BASE) MCG/ACT inhaler Inhale 2 puffs into the lungs every 6 (six) hours as needed for wheezing.  1 Inhaler  2  . HYDROcodone-homatropine (HYCODAN) 5-1.5 MG/5ML syrup Take 5 mLs by mouth every 6 (six) hours as needed for cough.  120 mL  0  . promethazine (PHENERGAN) 25 MG tablet Take 1 tablet (25 mg total) by mouth every 6 (six) hours as needed for nausea.  30 tablet  0    Review of Systems - Negative except for what is mentioned in HPI.  Physical Exam  Blood pressure 128/62, pulse 102, temperature 98.6 F (37 C), temperature source Oral, resp. rate 20, height 5\' 1"  (1.549 m), weight 84.188 kg (185 lb 9.6 oz), last menstrual period 08/12/2012, SpO2 100.00%. GENERAL: Well-developed, well-nourished female in no acute distress.  LUNGS: Clear to auscultation bilaterally.  HEART: Regular rate and rhythm. ABDOMEN: Soft, nontender, nondistended, gravid. SSE: normal vagina, vulva, labia and cervix. Normal discharge. Cervix visually closed.   EXTREMITIES: Nontender, trace bilateral lower extremity edema, 2+ distal pulses. FHT:  Baseline rate 160 bpm   Variability moderate  Accelerations present   Decelerations none Contractions: none   Labs: Results for orders placed during the hospital encounter of  03/06/13 (from the past 24 hour(s))  URINALYSIS, ROUTINE W REFLEX MICROSCOPIC   Collection Time    03/06/13  4:50 PM      Result Value Range   Color, Urine YELLOW  YELLOW   APPearance HAZY (*) CLEAR   Specific Gravity, Urine 1.020  1.005 - 1.030   pH 7.0  5.0 - 8.0   Glucose, UA NEGATIVE  NEGATIVE mg/dL   Hgb urine dipstick TRACE (*) NEGATIVE   Bilirubin Urine NEGATIVE  NEGATIVE   Ketones, ur NEGATIVE  NEGATIVE mg/dL   Protein, ur NEGATIVE  NEGATIVE mg/dL   Urobilinogen, UA 0.2  0.0 - 1.0 mg/dL   Nitrite NEGATIVE  NEGATIVE   Leukocytes, UA NEGATIVE   NEGATIVE  URINE MICROSCOPIC-ADD ON   Collection Time    03/06/13  4:50 PM      Result Value Range   Squamous Epithelial / LPF FEW (*) RARE   WBC, UA 3-6  <3 WBC/hpf   RBC / HPF 3-6  <3 RBC/hpf   Bacteria, UA MANY (*) RARE   Urine-Other AMORPHOUS URATES/PHOSPHATES    WET PREP, GENITAL   Collection Time    03/06/13  6:06 PM      Result Value Range   Yeast Wet Prep HPF POC NONE SEEN  NONE SEEN   Trich, Wet Prep NONE SEEN  NONE SEEN   Clue Cells Wet Prep HPF POC NONE SEEN  NONE SEEN   WBC, Wet Prep HPF POC FEW (*) NONE SEEN    Imaging Studies:  US Ob Comp + 14 Wk  02/14/2013    DETAILED SECOND TRIMESTER SONOGRAM  Patricia Williamson is in the office for detailed second trimester sonogram.  She is a 27 y.o. year old G39P1001 with Estimated Date of Delivery: 07/03/13  by early ultrasound now at  [redacted]w[redacted]d weeks gestation. Thus far the pregnancy  has been uncomplicated.   GESTATION: SINGLETON  PRESENTATION: transverse  FETAL ACTIVITY:          Heart rate         149 bpm          The fetus is active.  AMNIOTIC FLUID: The amniotic fluid volume is  normal,   PLACENTA LOCALIZATION:  posterior GRADE 0  CERVIX: Measures 4.2 cm  ADNEXA: The ovaries are normal.   GESTATIONAL AGE AND  BIOMETRICS:  Gestational criteria: Estimated Date of Delivery: 07/03/13 by early  ultrasound now at [redacted]w[redacted]d  Previous Scans:2              BIPARIETAL DIAMETER           4.63 cm         20+0 weeks  HEAD CIRCUMFERENCE           17.33 cm         19+6 weeks  ABDOMINAL CIRCUMFERENCE           15.11 cm         20+2 weeks  FEMUR LENGTH           3.21 cm         20+0 weeks                                                           AVERAGE EGA(BY THIS SCAN):   20+0 weeks  ESTIMATED FETAL WEIGHT:        339  grams,      ANATOMICAL SURVEY                                                                             COMMENTS CEREBRAL VENTRICLES yes normal   CHOROID PLEXUS yes normal   CEREBELLUM yes normal    CISTERNA MAGNA yes normal   NUCHAL REGION yes normal   ORBITS yes normal   NASAL BONE yes normal   NOSE/LIP yes normal   FACIAL PROFILE yes normal   4 CHAMBERED HEART yes normal   OUTFLOW TRACTS yes normal   DIAPHRAGM yes normal   STOMACH yes normal   RENAL REGION yes normal   BLADDER yes normal   CORD INSERTION yes normal   3 VESSEL CORD yes normal   SPINE yes normal   ARMS/HANDS yes normal   LEGS/FEET yes normal   GENITALIA yes normal female        SUSPECTED ABNORMALITIES:  no  QUALITY OF SCAN: satisfactory   TECHNICIAN COMMENTS:  U/S(20+1wks)-active fetus, meas c/w dates, cx long and closed 4.2cm, fluid  wnl, post gr 0 plac, bilateral adnexa wnl, no major abnl noted, female  fetus, FHR=149 bpm     A copy of this report including all images has been saved and backed up to  a second source for retrieval if needed. All measures and details of the  anatomical scan, placentation, fluid volume and pelvic anatomy are  contained in that report.  Chari Manning 02/14/2013 9:33 AM            Assessment: Patricia Williamson is  27 y.o. G2P1001 at [redacted]w[redacted]d presents with Back Pain, Abdominal Pain, Decreased Fetal Movement and Headache  UA and wet prep negative for infection. GC/Chlamydia obtained and pending.  NST reactive.   Plan: Discharge home with routine precautions.  Recommend good PO hydration. Can take Tylenol and Zofran PRN.  Follow up with [facility] as previously scheduled.   Hal Neer, MD Family Medicine, PGY-3   I have seen and examined this patient and agree with above documentation in the residents note.   1) back pain - palpable muscle spasms noted- likely 2/2 vomiting - rx of flexeril prn   2) nausea/vomiting - likely related to pregnancy. No signs of dehydration - rx of zofran prn   3) abd pain - consistent more with round ligament pain - reassurance and discussio on treatment options had  4) FWB - reassuring for gestation age  51) [facility].    Rulon Abide, M.D. [facility] Fellow 03/06/2013 9:09 PM

## 2013-03-06 NOTE — MAU Note (Signed)
Patient states she she has been having back and abdominal pain since 10-17, headache since 10-18. States she has had some vomiting off and on, none today. States she has felt fetal movement but not as much, today only one movement. Denies bleeding or discharge.

## 2013-03-06 NOTE — Telephone Encounter (Signed)
Pt called stating she had vomiting over the weekend, but none yet today. Pt denied having diarrhea, and she was unsure if she has ran a fever. Pt stated that her stomach and back hurts after vomiting. I spoke with Joellyn Haff and she advised the pt to push fluids and take the nausea medicine. Pt was advised of this and also advised that if she gets to where she can't keep anything down she would need to go to [facility]. Pt verbalized understanding.

## 2013-03-07 LAB — URINE CULTURE: Colony Count: 30000

## 2013-03-14 ENCOUNTER — Ambulatory Visit (INDEPENDENT_AMBULATORY_CARE_PROVIDER_SITE_OTHER): Payer: Medicaid Other | Admitting: Women's Health

## 2013-03-14 ENCOUNTER — Encounter: Payer: Self-pay | Admitting: Women's Health

## 2013-03-14 VITALS — BP 120/50 | Wt 185.0 lb

## 2013-03-14 DIAGNOSIS — Z3482 Encounter for supervision of other normal pregnancy, second trimester: Secondary | ICD-10-CM

## 2013-03-14 DIAGNOSIS — Z23 Encounter for immunization: Secondary | ICD-10-CM

## 2013-03-14 DIAGNOSIS — Z331 Pregnant state, incidental: Secondary | ICD-10-CM

## 2013-03-14 DIAGNOSIS — Z3480 Encounter for supervision of other normal pregnancy, unspecified trimester: Secondary | ICD-10-CM

## 2013-03-14 DIAGNOSIS — Z1389 Encounter for screening for other disorder: Secondary | ICD-10-CM

## 2013-03-14 LAB — POCT URINALYSIS DIPSTICK
Leukocytes, UA: NEGATIVE
Nitrite, UA: NEGATIVE
Protein, UA: NEGATIVE

## 2013-03-14 MED ORDER — INFLUENZA VAC SPLIT QUAD 0.5 ML IM SUSP
0.5000 mL | Freq: Once | INTRAMUSCULAR | Status: AC
  Administered 2013-03-14: 0.5 mL via INTRAMUSCULAR

## 2013-03-14 NOTE — Patient Instructions (Signed)
You will have your sugar test next visit.  Please do not eat or drink anything after midnight the night before you come, not even water.  You will be here for at least two hours.    Pregnancy - Second Trimester The second trimester of pregnancy (3 to 6 months) is a period of rapid growth for you and your baby. At the end of the sixth month, your baby is about 9 inches long and weighs 1 1/2 pounds. You will begin to feel the baby move between 18 and 20 weeks of the pregnancy. This is called quickening. Weight gain is faster. A clear fluid (colostrum) may leak out of your breasts. You may feel small contractions of the womb (uterus). This is known as false labor or Braxton-Hicks contractions. This is like a practice for labor when the baby is ready to be born. Usually, the problems with morning sickness have usually passed by the end of your first trimester. Some women develop small dark blotches (called cholasma, mask of pregnancy) on their face that usually goes away after the baby is born. Exposure to the sun makes the blotches worse. Acne may also develop in some pregnant women and pregnant women who have acne, may find that it goes away. PRENATAL EXAMS  Blood work may continue to be done during prenatal exams. These tests are done to check on your health and the probable health of your baby. Blood work is used to follow your blood levels (hemoglobin). Anemia (low hemoglobin) is common during pregnancy. Iron and vitamins are given to help prevent this. You will also be checked for diabetes between 24 and 28 weeks of the pregnancy. Some of the previous blood tests may be repeated.  The size of the uterus is measured during each visit. This is to make sure that the baby is continuing to grow properly according to the dates of the pregnancy.  Your blood pressure is checked every prenatal visit. This is to make sure you are not getting toxemia.  Your urine is checked to make sure you do not have an  infection, diabetes or protein in the urine.  Your weight is checked often to make sure gains are happening at the suggested rate. This is to ensure that both you and your baby are growing normally.  Sometimes, an ultrasound is performed to confirm the proper growth and development of the baby. This is a test which bounces harmless sound waves off the baby so your caregiver can more accurately determine due dates. Sometimes, a test is done on the amniotic fluid surrounding the baby. This test is called an amniocentesis. The amniotic fluid is obtained by sticking a needle into the belly (abdomen). This is done to check the chromosomes in instances where there is a concern about possible genetic problems with the baby. It is also sometimes done near the end of pregnancy if an early delivery is required. In this case, it is done to help make sure the baby's lungs are mature enough for the baby to live outside of the womb. CHANGES OCCURING IN THE SECOND TRIMESTER OF PREGNANCY Your body goes through many changes during pregnancy. They vary from person to person. Talk to your caregiver about changes you notice that you are concerned about.  During the second trimester, you will likely have an increase in your appetite. It is normal to have cravings for certain foods. This varies from person to person and pregnancy to pregnancy.  Your lower abdomen will begin to   bulge.  You may have to urinate more often because the uterus and baby are pressing on your bladder. It is also common to get more bladder infections during pregnancy. You can help this by drinking lots of fluids and emptying your bladder before and after intercourse.  You may begin to get stretch marks on your hips, abdomen, and breasts. These are normal changes in the body during pregnancy. There are no exercises or medicines to take that prevent this change.  You may begin to develop swollen and bulging veins (varicose veins) in your legs.  Wearing support hose, elevating your feet for 15 minutes, 3 to 4 times a day and limiting salt in your diet helps lessen the problem.  Heartburn may develop as the uterus grows and pushes up against the stomach. Antacids recommended by your caregiver helps with this problem. Also, eating smaller meals 4 to 5 times a day helps.  Constipation can be treated with a stool softener or adding bulk to your diet. Drinking lots of fluids, and eating vegetables, fruits, and whole grains are helpful.  Exercising is also helpful. If you have been very active up until your pregnancy, most of these activities can be continued during your pregnancy. If you have been less active, it is helpful to start an exercise program such as walking.  Hemorrhoids may develop at the end of the second trimester. Warm sitz baths and hemorrhoid cream recommended by your caregiver helps hemorrhoid problems.  Backaches may develop during this time of your pregnancy. Avoid heavy lifting, wear low heal shoes, and practice good posture to help with backache problems.  Some pregnant women develop tingling and numbness of their hand and fingers because of swelling and tightening of ligaments in the wrist (carpel tunnel syndrome). This goes away after the baby is born.  As your breasts enlarge, you may have to get a bigger bra. Get a comfortable, cotton, support bra. Do not get a nursing bra until the last month of the pregnancy if you will be nursing the baby.  You may get a dark line from your belly button to the pubic area called the linea nigra.  You may develop rosy cheeks because of increase blood flow to the face.  You may develop spider looking lines of the face, neck, arms, and chest. These go away after the baby is born. HOME CARE INSTRUCTIONS   It is extremely important to avoid all smoking, herbs, alcohol, and unprescribed drugs during your pregnancy. These chemicals affect the formation and growth of the baby. Avoid  these chemicals throughout the pregnancy to ensure the delivery of a healthy infant.  Most of your home care instructions are the same as suggested for the first trimester of your pregnancy. Keep your caregiver's appointments. Follow your caregiver's instructions regarding medicine use, exercise, and diet.  During pregnancy, you are providing food for you and your baby. Continue to eat regular, well-balanced meals. Choose foods such as meat, fish, milk and other low fat dairy products, vegetables, fruits, and whole-grain breads and cereals. Your caregiver will tell you of the ideal weight gain.  A physical sexual relationship may be continued up until near the end of pregnancy if there are no other problems. Problems could include early (premature) leaking of amniotic fluid from the membranes, vaginal bleeding, abdominal pain, or other medical or pregnancy problems.  Exercise regularly if there are no restrictions. Check with your caregiver if you are unsure of the safety of some of your exercises. The   greatest weight gain will occur in the last 2 trimesters of pregnancy. Exercise will help you:  Control your weight.  Get you in shape for labor and delivery.  Lose weight after you have the baby.  Wear a good support or jogging bra for breast tenderness during pregnancy. This may help if worn during sleep. Pads or tissues may be used in the bra if you are leaking colostrum.  Do not use hot tubs, steam rooms or saunas throughout the pregnancy.  Wear your seat belt at all times when driving. This protects you and your baby if you are in an accident.  Avoid raw meat, uncooked cheese, cat litter boxes, and soil used by cats. These carry germs that can cause birth defects in the baby.  The second trimester is also a good time to visit your dentist for your dental health if this has not been done yet. Getting your teeth cleaned is okay. Use a soft toothbrush. Brush gently during pregnancy.  It is  easier to leak urine during pregnancy. Tightening up and strengthening the pelvic muscles will help with this problem. Practice stopping your urination while you are going to the bathroom. These are the same muscles you need to strengthen. It is also the muscles you would use as if you were trying to stop from passing gas. You can practice tightening these muscles up 10 times a set and repeating this about 3 times per day. Once you know what muscles to tighten up, do not perform these exercises during urination. It is more likely to contribute to an infection by backing up the urine.  Ask for help if you have financial, counseling, or nutritional needs during pregnancy. Your caregiver will be able to offer counseling for these needs as well as refer you for other special needs.  Your skin may become oily. If so, wash your face with mild soap, use non-greasy moisturizer and oil or cream based makeup. MEDICINES AND DRUG USE IN PREGNANCY  Take prenatal vitamins as directed. The vitamin should contain 1 milligram of folic acid. Keep all vitamins out of reach of children. Only a couple vitamins or tablets containing iron may be fatal to a baby or young child when ingested.  Avoid use of all medicines, including herbs, over-the-counter medicines, not prescribed or suggested by your caregiver. Only take over-the-counter or prescription medicines for pain, discomfort, or fever as directed by your caregiver. Do not use aspirin.  Let your caregiver also know about herbs you may be using.  Alcohol is related to a number of birth defects. This includes fetal alcohol syndrome. All alcohol, in any form, should be avoided completely. Smoking will cause low birth rate and premature babies.  Street or illegal drugs are very harmful to the baby. They are absolutely forbidden. A baby born to an addicted mother will be addicted at birth. The baby will go through the same withdrawal an adult does. SEEK MEDICAL CARE IF:    You have any concerns or worries during your pregnancy. It is better to call with your questions if you feel they cannot wait, rather than worry about them. SEEK IMMEDIATE MEDICAL CARE IF:   An unexplained oral temperature above 102 F (38.9 C) develops, or as your caregiver suggests.  You have leaking of fluid from the vagina (birth canal). If leaking membranes are suspected, take your temperature and tell your caregiver of this when you call.  There is vaginal spotting, bleeding, or passing clots. Tell your caregiver of   the amount and how many pads are used. Light spotting in pregnancy is common, especially following intercourse.  You develop a bad smelling vaginal discharge with a change in the color from clear to white.  You continue to feel sick to your stomach (nauseated) and have no relief from remedies suggested. You vomit blood or coffee ground-like materials.  You lose more than 2 pounds of weight or gain more than 2 pounds of weight over 1 week, or as suggested by your caregiver.  You notice swelling of your face, hands, feet, or legs.  You get exposed to German measles and have never had them.  You are exposed to fifth disease or chickenpox.  You develop belly (abdominal) pain. Round ligament discomfort is a common non-cancerous (benign) cause of abdominal pain in pregnancy. Your caregiver still must evaluate you.  You develop a bad headache that does not go away.  You develop fever, diarrhea, pain with urination, or shortness of breath.  You develop visual problems, blurry, or double vision.  You fall or are in a car accident or any kind of trauma.  There is mental or physical violence at home. Document Released: 04/28/2001 Document Revised: 01/27/2012 Document Reviewed: 10/31/2008 [employer] Patient Information 2014 [employer].  

## 2013-03-14 NOTE — Progress Notes (Signed)
Reports good fm. Denies uc's, lof, vb, urinary frequency, urgency, hesitancy, or dysuria.  No complaints.  Went to [facility] 03/06/13 w/ n/v, HA, back pain. Feels much better. Reviewed ptl s/s, fm.  All questions answered. F/U in 4wks for pn2 and visit.

## 2013-03-17 NOTE — Telephone Encounter (Signed)
Close encounter 

## 2013-03-23 ENCOUNTER — Encounter: Payer: Self-pay | Admitting: Obstetrics & Gynecology

## 2013-03-23 ENCOUNTER — Ambulatory Visit (INDEPENDENT_AMBULATORY_CARE_PROVIDER_SITE_OTHER): Payer: Medicaid Other | Admitting: Obstetrics & Gynecology

## 2013-03-23 ENCOUNTER — Telehealth: Payer: Self-pay | Admitting: Obstetrics and Gynecology

## 2013-03-23 VITALS — BP 138/50 | Wt 187.0 lb

## 2013-03-23 DIAGNOSIS — O368190 Decreased fetal movements, unspecified trimester, not applicable or unspecified: Secondary | ICD-10-CM

## 2013-03-23 DIAGNOSIS — Z331 Pregnant state, incidental: Secondary | ICD-10-CM

## 2013-03-23 DIAGNOSIS — Z1389 Encounter for screening for other disorder: Secondary | ICD-10-CM

## 2013-03-23 LAB — POCT URINALYSIS DIPSTICK
Glucose, UA: NEGATIVE
Leukocytes, UA: NEGATIVE
Nitrite, UA: NEGATIVE
Protein, UA: NEGATIVE

## 2013-03-23 MED ORDER — BUTALBITAL-APAP-CAFFEINE 50-325-40 MG PO TABS: 1.0000 | ORAL_TABLET | Freq: Four times a day (QID) | ORAL | Status: DC | PRN

## 2013-03-23 NOTE — Progress Notes (Signed)
Work in: Urine clear Pt with multiple little complaints, tired, headache, round ligament pain, decreased sensation of fetal movement while doing Doppler excellent fetal movement which pt was aware of Abdomen benign

## 2013-03-23 NOTE — Telephone Encounter (Signed)
Pt states yesterday she squatted down and when she stood up, she felt funny. + urinary frequency. + headache today, Tylenol not helping. Pt feels like her BP is up, but has no way of checking it. Decreased fetal movement. No gush of fluid. No bleeding. Pt states she don't feel comfortable driving. I advised she needs to be seen somewhere. Pt states her mom will bring her to office. JSY

## 2013-04-11 ENCOUNTER — Encounter: Payer: Medicaid Other | Admitting: Obstetrics & Gynecology

## 2013-04-11 ENCOUNTER — Other Ambulatory Visit: Payer: Medicaid Other

## 2013-04-12 ENCOUNTER — Ambulatory Visit (INDEPENDENT_AMBULATORY_CARE_PROVIDER_SITE_OTHER): Payer: Medicaid Other | Admitting: Advanced Practice Midwife

## 2013-04-12 ENCOUNTER — Encounter: Payer: Self-pay | Admitting: Advanced Practice Midwife

## 2013-04-12 ENCOUNTER — Other Ambulatory Visit: Payer: Medicaid Other

## 2013-04-12 VITALS — BP 134/58 | Wt 190.0 lb

## 2013-04-12 DIAGNOSIS — Z3480 Encounter for supervision of other normal pregnancy, unspecified trimester: Secondary | ICD-10-CM

## 2013-04-12 DIAGNOSIS — O2390 Unspecified genitourinary tract infection in pregnancy, unspecified trimester: Secondary | ICD-10-CM

## 2013-04-12 LAB — POCT URINALYSIS DIPSTICK
Blood, UA: NEGATIVE
Glucose, UA: 500
Ketones, UA: NEGATIVE
Nitrite, UA: NEGATIVE

## 2013-04-12 LAB — CBC
MCHC: 34.2 g/dL (ref 30.0–36.0)
RDW: 13.6 % (ref 11.5–15.5)

## 2013-04-12 MED ORDER — METRONIDAZOLE 500 MG PO TABS: 500.0000 mg | ORAL_TABLET | Freq: Two times a day (BID) | ORAL | Status: DC

## 2013-04-12 NOTE — Addendum Note (Signed)
Addended by: Gaylyn Rong A on: 04/12/2013 10:58 AM   Modules accepted: Orders

## 2013-04-12 NOTE — Progress Notes (Signed)
C/O vaginal itching.  + BV, no trich or yeast.  Rx Flagyl 500mg  BID X 7.   Doing PN2 today. .  Routine questions about pregnancy answered.  F/U in 4 weeks for LROB.

## 2013-04-13 LAB — GLUCOSE TOLERANCE, 2 HOURS
Glucose, 2 hour: 116 mg/dL (ref 70–139)
Glucose, Fasting: 81 mg/dL (ref 70–99)

## 2013-04-13 LAB — ANTIBODY SCREEN: Antibody Screen: NEGATIVE

## 2013-05-10 ENCOUNTER — Encounter: Payer: Self-pay | Admitting: Obstetrics and Gynecology

## 2013-05-10 ENCOUNTER — Ambulatory Visit (INDEPENDENT_AMBULATORY_CARE_PROVIDER_SITE_OTHER): Payer: Medicaid Other | Admitting: Obstetrics and Gynecology

## 2013-05-10 VITALS — BP 120/58 | Wt 194.0 lb

## 2013-05-10 DIAGNOSIS — Z331 Pregnant state, incidental: Secondary | ICD-10-CM

## 2013-05-10 DIAGNOSIS — Z3480 Encounter for supervision of other normal pregnancy, unspecified trimester: Secondary | ICD-10-CM

## 2013-05-10 DIAGNOSIS — Z1389 Encounter for screening for other disorder: Secondary | ICD-10-CM

## 2013-05-10 DIAGNOSIS — Z3483 Encounter for supervision of other normal pregnancy, third trimester: Secondary | ICD-10-CM

## 2013-05-10 LAB — POCT URINALYSIS DIPSTICK
Glucose, UA: NEGATIVE
Ketones, UA: NEGATIVE
Protein, UA: NEGATIVE

## 2013-05-10 NOTE — Progress Notes (Signed)
bc options reviewed in detail.  =still strongly in favor of BTL, discussed timing. Also discussed interval tubal at 6 mos due to SIDS average risk 1:1000. Pt to discuss with partner and followup with our office at followup visit.

## 2013-05-15 ENCOUNTER — Encounter (HOSPITAL_COMMUNITY): Payer: Self-pay | Admitting: *Deleted

## 2013-05-15 ENCOUNTER — Inpatient Hospital Stay (HOSPITAL_COMMUNITY)
Admission: AD | Admit: 2013-05-15 | Discharge: 2013-05-17 | DRG: 778 | Disposition: A | Discharge status: Home / Self Care | Payer: Medicaid Other | Source: Ambulatory Visit | Attending: Obstetrics & Gynecology | Admitting: Obstetrics & Gynecology

## 2013-05-15 ENCOUNTER — Inpatient Hospital Stay (HOSPITAL_COMMUNITY): Payer: Medicaid Other

## 2013-05-15 DIAGNOSIS — O4700 False labor before 37 completed weeks of gestation, unspecified trimester: Principal | ICD-10-CM | POA: Diagnosis present

## 2013-05-15 DIAGNOSIS — Z3483 Encounter for supervision of other normal pregnancy, third trimester: Secondary | ICD-10-CM

## 2013-05-15 DIAGNOSIS — Z87891 Personal history of nicotine dependence: Secondary | ICD-10-CM

## 2013-05-15 DIAGNOSIS — O4703 False labor before 37 completed weeks of gestation, third trimester: Secondary | ICD-10-CM

## 2013-05-15 DIAGNOSIS — O479 False labor, unspecified: Secondary | ICD-10-CM | POA: Diagnosis present

## 2013-05-15 LAB — URINALYSIS, ROUTINE W REFLEX MICROSCOPIC
Glucose, UA: NEGATIVE mg/dL
Ketones, ur: NEGATIVE mg/dL
Nitrite: NEGATIVE
Protein, ur: NEGATIVE mg/dL
Urobilinogen, UA: 0.2 mg/dL (ref 0.0–1.0)

## 2013-05-15 LAB — URINE MICROSCOPIC-ADD ON

## 2013-05-15 LAB — OB RESULTS CONSOLE GC/CHLAMYDIA
CHLAMYDIA, DNA PROBE: NEGATIVE
GC PROBE AMP, GENITAL: NEGATIVE

## 2013-05-15 LAB — WET PREP, GENITAL: Clue Cells Wet Prep HPF POC: NONE SEEN

## 2013-05-15 LAB — FETAL FIBRONECTIN: Fetal Fibronectin: POSITIVE — AB

## 2013-05-15 LAB — OB RESULTS CONSOLE GBS: STREP GROUP B AG: POSITIVE

## 2013-05-15 MED ORDER — NIFEDIPINE 10 MG PO CAPS
20.0000 mg | ORAL_CAPSULE | ORAL | Status: AC
  Administered 2013-05-15: 20 mg via ORAL
  Filled 2013-05-15: qty 2

## 2013-05-15 MED ORDER — FENTANYL CITRATE 0.05 MG/ML IJ SOLN
100.0000 ug | INTRAMUSCULAR | Status: DC | PRN
  Administered 2013-05-15: 100 ug via INTRAVENOUS
  Filled 2013-05-15: qty 2

## 2013-05-15 MED ORDER — CALCIUM CARBONATE ANTACID 500 MG PO CHEW
2.0000 | CHEWABLE_TABLET | ORAL | Status: DC | PRN
  Filled 2013-05-15: qty 2

## 2013-05-15 MED ORDER — LACTATED RINGERS IV SOLN
INTRAVENOUS | Status: DC
  Administered 2013-05-15 – 2013-05-16 (×3): via INTRAVENOUS

## 2013-05-15 MED ORDER — DOCUSATE SODIUM 100 MG PO CAPS
100.0000 mg | ORAL_CAPSULE | Freq: Every day | ORAL | Status: DC
  Administered 2013-05-16: 100 mg via ORAL
  Filled 2013-05-15 (×2): qty 1

## 2013-05-15 MED ORDER — BETAMETHASONE SOD PHOS & ACET 6 (3-3) MG/ML IJ SUSP
12.0000 mg | INTRAMUSCULAR | Status: AC
  Administered 2013-05-15: 12 mg via INTRAMUSCULAR
  Filled 2013-05-15: qty 2

## 2013-05-15 MED ORDER — NIFEDIPINE 10 MG PO CAPS
20.0000 mg | ORAL_CAPSULE | Freq: Once | ORAL | Status: AC
  Administered 2013-05-15: 20 mg via ORAL
  Filled 2013-05-15: qty 2

## 2013-05-15 MED ORDER — LACTATED RINGERS IV BOLUS (SEPSIS)
1000.0000 mL | Freq: Once | INTRAVENOUS | Status: AC
  Administered 2013-05-15: 1000 mL via INTRAVENOUS

## 2013-05-15 MED ORDER — BETAMETHASONE SOD PHOS & ACET 6 (3-3) MG/ML IJ SUSP
12.0000 mg | INTRAMUSCULAR | Status: AC
  Administered 2013-05-16: 12 mg via INTRAMUSCULAR
  Filled 2013-05-15: qty 2

## 2013-05-15 MED ORDER — NIFEDIPINE 10 MG PO CAPS: 10.0000 mg | ORAL_CAPSULE | Freq: Four times a day (QID) | ORAL | Status: DC | PRN

## 2013-05-15 MED ORDER — ZOLPIDEM TARTRATE 5 MG PO TABS: 5.0000 mg | ORAL_TABLET | Freq: Every evening | ORAL | Status: DC | PRN

## 2013-05-15 MED ORDER — TERBUTALINE SULFATE 1 MG/ML IJ SOLN
0.2500 mg | Freq: Once | INTRAMUSCULAR | Status: AC
  Administered 2013-05-15: 0.25 mg via SUBCUTANEOUS
  Filled 2013-05-15: qty 1

## 2013-05-15 MED ORDER — ACETAMINOPHEN 325 MG PO TABS: 650.0000 mg | ORAL_TABLET | ORAL | Status: DC | PRN

## 2013-05-15 MED ORDER — PRENATAL MULTIVITAMIN CH
1.0000 | ORAL_TABLET | Freq: Every day | ORAL | Status: DC
  Filled 2013-05-15 (×2): qty 1

## 2013-05-15 NOTE — MAU Provider Note (Signed)
None     Chief Complaint:  No chief complaint on file.   Patricia Williamson is  27 y.o. G2P1001 at [redacted]w[redacted]d presents complaining of painful cramping that started this afternoon at approximately 1400. Pt states they have increased in frequency and become more painful since onset. Baby is moving normally and no VB or LOF.  Pt had intercourse yesterday morning >24hr apart.  No HA, vision chagnes, SOB, n/v, d/c, other abdominal pain, or other complaints at this time.  Obstetrical/Gynecological History: OB History   Grav Para Term Preterm Abortions TAB SAB Ect Mult Living   2 1 1       1      Past Medical History: Past Medical History  Diagnosis Date  . Asthma   . Hx of constipation     Past Surgical History: Past Surgical History  Procedure Laterality Date  . Cystoscopy w/ ureteral stent placement      Family History: Family History  Problem Relation Age of Onset  . Hypertension Mother   . Diabetes Mother   . Hyperlipidemia Mother   . Hypertension Brother   . Other Paternal Aunt     benign breast lump  . Diabetes Maternal Grandmother   . Heart disease Maternal Grandmother     Social History: History  Substance Use Topics  . Smoking status: Former Smoker    Types: Cigarettes  . Smokeless tobacco: Never Used  . Alcohol Use: No     Comment: occassional; not now    Allergies:  Allergies  Allergen Reactions  . Vinegar [Acetic Acid] Hives and Itching  . Sulfa Antibiotics Rash    Meds:  Prescriptions prior to admission  Medication Sig Dispense Refill  . acetaminophen (TYLENOL) 500 MG tablet Take 1,000 mg by mouth every 6 (six) hours as needed for pain (For headache.).      Marland Kitchen albuterol (PROVENTIL HFA;VENTOLIN HFA) 108 (90 BASE) MCG/ACT inhaler Inhale 2 puffs into the lungs every 6 (six) hours as needed for wheezing.  1 Inhaler  2  . promethazine (PHENERGAN) 25 MG tablet Take 1 tablet (25 mg total) by mouth every 6 (six) hours as needed for nausea.  30 tablet  0  .  senna (SENOKOT) 8.6 MG TABS tablet Take 2 tablets by mouth daily.        Review of Systems -       Physical Exam  Blood pressure 129/76, pulse 91, temperature 98.3 F (36.8 C), resp. rate 18, last menstrual period 08/12/2012. GENERAL: Well-developed, well-nourished female in no acute distress.  ABDOMEN: Soft, nontender, nondistended, gravid.  EXTREMITIES: Nontender, no edema, 2+ distal pulses. Dilation: 1 Effacement (%): 30 Cervical Position: Posterior Presentation: Vertex Exam by:: Dr Ike Bene  FHT:  Baseline rate 130-140 bpm   Variability moderate  Accelerations present   Decelerations none Contractions: Every 2 mins   Labs: Results for orders placed during the hospital encounter of 05/15/13 (from the past 24 hour(s))  URINALYSIS, ROUTINE W REFLEX MICROSCOPIC   Collection Time    05/15/13  5:45 PM      Result Value Range   Color, Urine YELLOW  YELLOW   APPearance CLEAR  CLEAR   Specific Gravity, Urine <1.005 (*) 1.005 - 1.030   pH 5.5  5.0 - 8.0   Glucose, UA NEGATIVE  NEGATIVE mg/dL   Hgb urine dipstick NEGATIVE  NEGATIVE   Bilirubin Urine NEGATIVE  NEGATIVE   Ketones, ur NEGATIVE  NEGATIVE mg/dL   Protein, ur NEGATIVE  NEGATIVE mg/dL  Urobilinogen, UA 0.2  0.0 - 1.0 mg/dL   Nitrite NEGATIVE  NEGATIVE   Leukocytes, UA MODERATE (*) NEGATIVE  URINE MICROSCOPIC-ADD ON   Collection Time    05/15/13  5:45 PM      Result Value Range   Squamous Epithelial / LPF FEW (*) RARE   WBC, UA 7-10  <3 WBC/hpf   RBC / HPF 3-6  <3 RBC/hpf   Bacteria, UA FEW (*) RARE  FETAL FIBRONECTIN   Collection Time    05/15/13  5:53 PM      Result Value Range   Fetal Fibronectin POSITIVE (*) NEGATIVE  WET PREP, GENITAL   Collection Time    05/15/13  7:15 PM      Result Value Range   Yeast Wet Prep HPF POC MODERATE (*) NONE SEEN   Trich, Wet Prep NONE SEEN  NONE SEEN   Clue Cells Wet Prep HPF POC NONE SEEN  NONE SEEN   WBC, Wet Prep HPF POC MODERATE (*) NONE SEEN   Imaging Studies:   No results found.  Assessment: Patricia Williamson is  27 y.o. G2P1001 at [redacted]w[redacted]d presents with preterm contractions. .  Plan: Pt with +FFN, will give dexamethasone 12mg  now and in 24 hrs. Will also measure cervical length. Reassuring if >24hr in setting of possible false + FFN. Given procardia 20mg  x2 for tocolysis at this time. Pt handed over to [place] for remainder of work up.  Tawana Scale 12/29/20148:52 PM  Assumed care of pt at 2000. Minimal improvement in UC's. IV bolus and 3rd dose procardia ordered.   CL 2.63 cm. Vtx  No improvement with Procardia #3 and fluids. Contractions still very uncomfortable and Q2 minutes. No cervical change.  ROS: Pos contractions. Neg for fever, chills, HA, vision changes, Epigastric pain, LOF, VB, urinary complaints, GI complaints.  PE:  General: Mild distress Heart: RRR Lungs: CTAB Abd: Soft, NT, gravid, S=D. No CVAT  Assessment: 1. Preterm contractions, third trimester   VVC  Plan: Observe on Ante per consult w/ Dr. Marice Potter.  Terb Fentanyl Procardia  BMZ #2 tomorrrow at 1930. Urine culture GBS  [place], CNM 05/15/2013 9:30 PM

## 2013-05-15 NOTE — MAU Note (Signed)
Took Senokot on Saturday, 12/27. States she has had normal bowel movements since, but states she has not felt "right" since. C/O of pressure in lower abdomen and in vagina. Hurts worse when she lays down and/or changes positions. States she has had several UTI's and was treated. States she urinated on arrival, cannot void now.

## 2013-05-16 DIAGNOSIS — O479 False labor, unspecified: Secondary | ICD-10-CM

## 2013-05-16 LAB — CBC
HCT: 36.9 % (ref 36.0–46.0)
Hemoglobin: 12.9 g/dL (ref 12.0–15.0)
MCHC: 35 g/dL (ref 30.0–36.0)
Platelets: 229 10*3/uL (ref 150–400)
RBC: 4.36 MIL/uL (ref 3.87–5.11)
RDW: 13.1 % (ref 11.5–15.5)
WBC: 14.5 10*3/uL — ABNORMAL HIGH (ref 4.0–10.5)

## 2013-05-16 LAB — URINE CULTURE: Colony Count: 70000

## 2013-05-16 LAB — GC/CHLAMYDIA PROBE AMP
CT Probe RNA: NEGATIVE
GC Probe RNA: NEGATIVE

## 2013-05-16 MED ORDER — FLUCONAZOLE 150 MG PO TABS
150.0000 mg | ORAL_TABLET | Freq: Once | ORAL | Status: AC
  Administered 2013-05-16: 150 mg via ORAL
  Filled 2013-05-16: qty 1

## 2013-05-16 NOTE — H&P (Signed)
HPI: Patricia Williamson is a 27 y.o. year old G97P1001 female at [redacted]w[redacted]d weeks gestation who presents to MAU reporting preterm contraction. Gets care at [facility]. Got first dose at BMZ at 1930 05/15/2013 in MAU. No cervical change while in MAU , but frequent contractions continued despite IV bolus, Procardia x 3.   Maternal Medical History:  Reason for admission: Contractions.     OB History   Grav Para Term Preterm Abortions TAB SAB Ect Mult Living   2 1 1       1      Past Medical History  Diagnosis Date  . Asthma   . Hx of constipation    Past Surgical History  Procedure Laterality Date  . Cystoscopy w/ ureteral stent placement     Family History: family history includes Diabetes in her maternal grandmother and mother; Heart disease in her maternal grandmother; Hyperlipidemia in her mother; Hypertension in her brother and mother; Other in her paternal aunt. Social History:  reports that she has quit smoking. Her smoking use included Cigarettes. She smoked 0.00 packs per day. She has never used smokeless tobacco. She reports that she does not drink alcohol or use illicit drugs.   Prenatal Transfer Tool  Maternal Diabetes: No Genetic Screening: Normal Maternal Ultrasounds/Referrals: Normal Fetal Ultrasounds or other Referrals:  None Maternal Substance Abuse:  No Significant Maternal Medications:  None Significant Maternal Lab Results:  Lab values include: Other:  Other Comments:  Rubella Non-Immune. GBS pending. BMZ #1 12/29  Review of Systems  Constitutional: Negative for fever and chills.  Eyes: Negative for blurred vision.  Gastrointestinal: Positive for abdominal pain (contractions). Negative for vomiting.  Genitourinary: Negative for dysuria, urgency, frequency, hematuria and flank pain.  Neurological: Negative for headaches.    Dilation: 1 Effacement (%): 30 Exam by:: V Anslie Spadafora CNM Blood pressure 121/42, pulse 114, temperature 97.9 F (36.6 C), temperature source Oral,  resp. rate 18, height 5' (1.524 m), weight 87.998 kg (194 lb), last menstrual period 08/12/2012. Maternal Exam:  Uterine Assessment: Contraction strength is moderate.  Contraction frequency is regular.   Abdomen: Patient reports no abdominal tenderness. Fetal presentation: vertex  Introitus: Normal vulva. Normal vagina.  Pelvis: adequate for delivery.   Cervix: Cervix evaluated by digital exam.     Fetal Exam Fetal Monitor Review: Mode: ultrasound.   Baseline rate: 130.  Pattern: accelerations present and no decelerations.    Fetal State Assessment: Category I - tracings are normal.     Physical Exam  Prenatal labs: ABO, Rh: A/POS/-- (07/15 1200) Antibody: NEG (11/26 0914) Rubella: 0.89 (07/15 1200) RPR: NON REAC (11/26 0914)  HBsAg: NEGATIVE (07/15 1200)  HIV: NON REACTIVE (11/26 0914)  GBS:   Pending 2 hour GTT normal  UA CL 2.63 cm, vtx  Assessment:  1.  Preterm contractions, third trimester   VVC   Plan:  Observe on Ante per consult w/ Dr. Marice Potter.  Terb  Fentanyl  Procardia  BMZ #2 tomorrrow at 1930.  Urine culture  GBS pending  Jahvon Gosline 05/16/2013, 6:57 AM

## 2013-05-16 NOTE — Progress Notes (Signed)
Patient ID: Patricia Williamson, female   DOB: July 06, 1985, 27 y.o.   MRN: [id number] FACULTY PRACTICE ANTEPARTUM(COMPREHENSIVE) NOTE  Patricia Williamson is a 27 y.o. G2P1001 at [redacted]w[redacted]d  who is admitted for preterm contractions.    Fetal presentation is unsure. Length of Stay:  1  Days  Date of admission:05/15/2013  Subjective: Patient doing well, reports significant improvement in symptoms, occasionally feels contractions and pelvic pressure Patient reports the fetal movement as active. Patient reports uterine contraction  activity as irregular. Patient reports  vaginal bleeding as none. Patient describes fluid per vagina as None.  Vitals:  Blood pressure 83/50, pulse 94, temperature 98.3 F (36.8 C), temperature source Oral, resp. rate 18, height 5' (1.524 m), weight 194 lb (87.998 kg), last menstrual period 08/12/2012. Filed Vitals:   05/15/13 2334 05/16/13 0030 05/16/13 0749 05/16/13 1214  BP: 121/42  121/51 83/50  Pulse: 114  78 94  Temp: 97.9 F (36.6 C)  98.4 F (36.9 C) 98.3 F (36.8 C)  TempSrc: Oral  Oral Oral  Resp: 18 18 20 18   Height: 5' (1.524 m)     Weight: 194 lb (87.998 kg)      Physical Examination:  General appearance - alert, well appearing, and in no distress Fundal Height:  size equals dates Pelvic Exam:  examination not indicated Cervical Exam: Not evaluated.  Extremities: extremities normal, atraumatic, no cyanosis or edema with DTRs 2+ bilaterally Membranes:intact  Fetal Monitoring:  Baseline: 145 bpm, Variability: Good {> 6 bpm), Accelerations: Reactive, Decelerations: Absent and [place]: no contractions   reactive  Labs:  Results for orders placed during the hospital encounter of 05/15/13 (from the past 24 hour(s))  URINALYSIS, ROUTINE W REFLEX MICROSCOPIC   Collection Time    05/15/13  5:45 PM      Result Value Range   Color, Urine YELLOW  YELLOW   APPearance CLEAR  CLEAR   Specific Gravity, Urine <1.005 (*) 1.005 - 1.030   pH 5.5  5.0 - 8.0   Glucose,  UA NEGATIVE  NEGATIVE mg/dL   Hgb urine dipstick NEGATIVE  NEGATIVE   Bilirubin Urine NEGATIVE  NEGATIVE   Ketones, ur NEGATIVE  NEGATIVE mg/dL   Protein, ur NEGATIVE  NEGATIVE mg/dL   Urobilinogen, UA 0.2  0.0 - 1.0 mg/dL   Nitrite NEGATIVE  NEGATIVE   Leukocytes, UA MODERATE (*) NEGATIVE  URINE MICROSCOPIC-ADD ON   Collection Time    05/15/13  5:45 PM      Result Value Range   Squamous Epithelial / LPF FEW (*) RARE   WBC, UA 7-10  <3 WBC/hpf   RBC / HPF 3-6  <3 RBC/hpf   Bacteria, UA FEW (*) RARE  FETAL FIBRONECTIN   Collection Time    05/15/13  5:53 PM      Result Value Range   Fetal Fibronectin POSITIVE (*) NEGATIVE  WET PREP, GENITAL   Collection Time    05/15/13  7:15 PM      Result Value Range   Yeast Wet Prep HPF POC MODERATE (*) NONE SEEN   Trich, Wet Prep NONE SEEN  NONE SEEN   Clue Cells Wet Prep HPF POC NONE SEEN  NONE SEEN   WBC, Wet Prep HPF POC MODERATE (*) NONE SEEN  GC/CHLAMYDIA PROBE AMP   Collection Time    05/15/13  7:15 PM      Result Value Range   CT Probe RNA NEGATIVE  NEGATIVE   GC Probe RNA NEGATIVE  NEGATIVE  CBC  Collection Time    05/16/13  5:16 AM      Result Value Range   WBC 14.5 (*) 4.0 - 10.5 K/uL   RBC 4.36  3.87 - 5.11 MIL/uL   Hemoglobin 12.9  12.0 - 15.0 g/dL   HCT 62.9  52.8 - 41.3 %   MCV 84.6  78.0 - 100.0 fL   MCH 29.6  26.0 - 34.0 pg   MCHC 35.0  30.0 - 36.0 g/dL   RDW 24.4  01.0 - 27.2 %   Platelets 229  150 - 400 K/uL  RPR   Collection Time    05/16/13  8:10 AM      Result Value Range   RPR NON REACTIVE  NON REACTIVE    Imaging Studies:      Medications:  Scheduled . betamethasone acetate-betamethasone sodium phosphate  12 mg Intramuscular STAT  . docusate sodium  100 mg Oral Daily  . prenatal multivitamin  1 tablet Oral Q1200   I have reviewed the patient's current medications.  ASSESSMENT: Patient Active Problem List   Diagnosis Date Noted  . Preterm contractions 05/15/2013  . Asthma exacerbation  02/22/2013  . Bronchitis with asthma, acute 02/22/2013  . Rubella non-immune status, antepartum 12/01/2012  . Supervision of other normal pregnancy 11/29/2012    PLAN: Second dose of BMZ due this evening Tocolysis prn Consider d/c home in am if no cervical change is made  Lilly Gasser 05/16/2013,3:07 PM

## 2013-05-17 LAB — CBC
HCT: 37.3 % (ref 36.0–46.0)
Hemoglobin: 12.8 g/dL (ref 12.0–15.0)
MCH: 29.4 pg (ref 26.0–34.0)
MCV: 85.7 fL (ref 78.0–100.0)
RBC: 4.35 MIL/uL (ref 3.87–5.11)
RDW: 13.4 % (ref 11.5–15.5)
WBC: 15.2 10*3/uL — ABNORMAL HIGH (ref 4.0–10.5)

## 2013-05-17 NOTE — Progress Notes (Signed)
Patient discharge teaching completed. Patient provided teach back. Will follow up with care provider as scheduled. Left in stable, ambulatory condition.

## 2013-05-17 NOTE — Discharge Summary (Signed)
Physician Discharge Summary  Patient ID: Patricia Williamson MRN: [id number] DOB/AGE: [birth year] 27 y.o.  Admit date: 05/15/2013 Discharge date: 05/17/2013  Admission Diagnoses:[redacted] week gestation preterm labor  Discharge Diagnoses: same, resolved Active Problems:   Preterm contractions   Discharged Condition: good  Hospital Course: Admitted with premature contractions, positive ffn, treated with terbutaline and procardia, no cervical change.  Consults: None  Significant Diagnostic Studies:  ffn positive. GBS and urine culture pending  Treatments: procaardia  Discharge Exam: Blood pressure 137/67, pulse 87, temperature 97.8 F (36.6 C), temperature source Oral, resp. rate 18, height 5' (1.524 m), weight 194 lb (87.998 kg), last menstrual period 08/12/2012. General appearance: alert, cooperative and no distress GI: gravid c/w dates Pelvic: external genitalia normal and cervix 1 cm, long Extremities: extremities normal, atraumatic, no cyanosis or edema  Disposition: 01-Home or Self Care   Future Appointments Provider Department Dept Phone   05/24/2013 11:15 AM Jacklyn Shell, CNM [facility] [phone]       Medication List         acetaminophen 500 MG tablet  Commonly known as:  TYLENOL  Take 1,000 mg by mouth every 6 (six) hours as needed for pain (For headache.).     albuterol 108 (90 BASE) MCG/ACT inhaler  Commonly known as:  PROVENTIL HFA;VENTOLIN HFA  Inhale 2 puffs into the lungs every 6 (six) hours as needed for wheezing.     promethazine 25 MG tablet  Commonly known as:  PHENERGAN  Take 1 tablet (25 mg total) by mouth every 6 (six) hours as needed for nausea.     senna 8.6 MG Tabs tablet  Commonly known as:  SENOKOT  Take 2 tablets by mouth daily.           Follow-up Information   Follow up with [facility] On 05/24/2013.   Contact information:   [address] [place] [address] [phone]    preterm labor  precautions  Signed: ARNOLD,JAMES 05/17/2013, 6:51 AM

## 2013-05-18 ENCOUNTER — Inpatient Hospital Stay (HOSPITAL_COMMUNITY): Payer: Medicaid Other

## 2013-05-18 ENCOUNTER — Encounter (HOSPITAL_COMMUNITY): Payer: Self-pay | Admitting: General Practice

## 2013-05-18 ENCOUNTER — Inpatient Hospital Stay (HOSPITAL_COMMUNITY)
Admission: AD | Admit: 2013-05-18 | Discharge: 2013-05-20 | DRG: 444 | Disposition: A | Discharge status: Home / Self Care | Payer: Medicaid Other | Source: Ambulatory Visit | Attending: Obstetrics & Gynecology | Admitting: Obstetrics & Gynecology

## 2013-05-18 DIAGNOSIS — Z87891 Personal history of nicotine dependence: Secondary | ICD-10-CM

## 2013-05-18 DIAGNOSIS — O99891 Other specified diseases and conditions complicating pregnancy: Secondary | ICD-10-CM | POA: Diagnosis present

## 2013-05-18 DIAGNOSIS — R1011 Right upper quadrant pain: Secondary | ICD-10-CM | POA: Diagnosis present

## 2013-05-18 DIAGNOSIS — K8020 Calculus of gallbladder without cholecystitis without obstruction: Secondary | ICD-10-CM | POA: Diagnosis present

## 2013-05-18 DIAGNOSIS — O4700 False labor before 37 completed weeks of gestation, unspecified trimester: Secondary | ICD-10-CM | POA: Diagnosis present

## 2013-05-18 DIAGNOSIS — O479 False labor, unspecified: Secondary | ICD-10-CM | POA: Diagnosis present

## 2013-05-18 DIAGNOSIS — O26619 Liver and biliary tract disorders in pregnancy, unspecified trimester: Secondary | ICD-10-CM

## 2013-05-18 DIAGNOSIS — O9989 Other specified diseases and conditions complicating pregnancy, childbirth and the puerperium: Secondary | ICD-10-CM | POA: Diagnosis present

## 2013-05-18 DIAGNOSIS — O99820 Streptococcus B carrier state complicating pregnancy: Secondary | ICD-10-CM

## 2013-05-18 DIAGNOSIS — Z22330 Carrier of Group B streptococcus: Secondary | ICD-10-CM

## 2013-05-18 LAB — COMPREHENSIVE METABOLIC PANEL
ALK PHOS: 88 U/L (ref 39–117)
ALT: 9 U/L (ref 0–35)
AST: 12 U/L (ref 0–37)
Albumin: 3.2 g/dL — ABNORMAL LOW (ref 3.5–5.2)
BUN: 5 mg/dL — AB (ref 6–23)
CO2: 21 mEq/L (ref 19–32)
Calcium: 9 mg/dL (ref 8.4–10.5)
Chloride: 101 mEq/L (ref 96–112)
Creatinine, Ser: 0.36 mg/dL — ABNORMAL LOW (ref 0.50–1.10)
GFR calc Af Amer: >90 mL/min (ref >90)
GLUCOSE: 99 mg/dL (ref 70–99)
POTASSIUM: 3.5 meq/L — AB (ref 3.7–5.3)
Sodium: 137 mEq/L (ref 137–147)
TOTAL PROTEIN: 6.5 g/dL (ref 6.0–8.3)

## 2013-05-18 LAB — CBC WITH DIFFERENTIAL/PLATELET
BASOS PCT: 0 % (ref 0–1)
Basophils Absolute: 0 10*3/uL (ref 0.0–0.1)
EOS PCT: 0 % (ref 0–5)
Eosinophils Absolute: 0.1 10*3/uL (ref 0.0–0.7)
HCT: 39.8 % (ref 36.0–46.0)
Hemoglobin: 13.6 g/dL (ref 12.0–15.0)
LYMPHS ABS: 2.2 10*3/uL (ref 0.7–4.0)
Lymphocytes Relative: 10 % — ABNORMAL LOW (ref 12–46)
MCH: 29.4 pg (ref 26.0–34.0)
MCHC: 34.2 g/dL (ref 30.0–36.0)
MCV: 86 fL (ref 78.0–100.0)
Monocytes Absolute: 1.9 10*3/uL — ABNORMAL HIGH (ref 0.1–1.0)
Monocytes Relative: 8 % (ref 3–12)
Neutro Abs: 17.7 10*3/uL — ABNORMAL HIGH (ref 1.7–7.7)
Neutrophils Relative %: 81 % — ABNORMAL HIGH (ref 43–77)
PLATELETS: 252 10*3/uL (ref 150–400)
RBC: 4.63 MIL/uL (ref 3.87–5.11)
RDW: 13.6 % (ref 11.5–15.5)
WBC: 21.9 10*3/uL — ABNORMAL HIGH (ref 4.0–10.5)

## 2013-05-18 LAB — CULTURE, OB URINE
Colony Count: NO GROWTH
Culture: NO GROWTH
Special Requests: NORMAL

## 2013-05-18 LAB — TYPE AND SCREEN
ABO/RH(D): A POS
Antibody Screen: NEGATIVE

## 2013-05-18 LAB — CULTURE, BETA STREP (GROUP B ONLY)

## 2013-05-18 LAB — AMYLASE: AMYLASE: 35 U/L (ref 0–105)

## 2013-05-18 LAB — LIPASE, BLOOD: Lipase: 22 U/L (ref 11–59)

## 2013-05-18 MED ORDER — HYDROMORPHONE HCL PF 1 MG/ML IJ SOLN
1.0000 mg | INTRAMUSCULAR | Status: DC | PRN
Start: 2013-05-18 — End: 2013-05-20
  Administered 2013-05-18 – 2013-05-19 (×4): 2 mg via INTRAVENOUS
  Filled 2013-05-18 (×4): qty 2

## 2013-05-18 MED ORDER — ONDANSETRON 4 MG PO TBDP
4.0000 mg | ORAL_TABLET | Freq: Four times a day (QID) | ORAL | Status: DC | PRN
  Administered 2013-05-19: 4 mg via ORAL
  Filled 2013-05-18 (×2): qty 1

## 2013-05-18 MED ORDER — PRENATAL MULTIVITAMIN CH
1.0000 | ORAL_TABLET | Freq: Every day | ORAL | Status: DC
  Filled 2013-05-18: qty 1

## 2013-05-18 MED ORDER — PROMETHAZINE HCL 25 MG/ML IJ SOLN
25.0000 mg | Freq: Four times a day (QID) | INTRAMUSCULAR | Status: DC | PRN
  Administered 2013-05-18 (×2): 25 mg via INTRAVENOUS
  Filled 2013-05-18 (×2): qty 1

## 2013-05-18 MED ORDER — CALCIUM CARBONATE ANTACID 500 MG PO CHEW
2.0000 | CHEWABLE_TABLET | ORAL | Status: DC | PRN
  Filled 2013-05-18: qty 2

## 2013-05-18 MED ORDER — FENTANYL CITRATE 0.05 MG/ML IJ SOLN
50.0000 ug | Freq: Once | INTRAMUSCULAR | Status: AC
  Administered 2013-05-18: 50 ug via INTRAMUSCULAR
  Filled 2013-05-18: qty 2

## 2013-05-18 MED ORDER — ONDANSETRON HCL 4 MG/2ML IJ SOLN
4.0000 mg | Freq: Once | INTRAMUSCULAR | Status: DC
  Filled 2013-05-18: qty 2

## 2013-05-18 MED ORDER — HYDROMORPHONE HCL 2 MG PO TABS
4.0000 mg | ORAL_TABLET | ORAL | Status: DC | PRN
Start: 2013-05-18 — End: 2013-05-20
  Administered 2013-05-19 – 2013-05-20 (×7): 4 mg via ORAL
  Filled 2013-05-18 (×7): qty 2

## 2013-05-18 MED ORDER — LACTATED RINGERS IV SOLN
INTRAVENOUS | Status: DC
  Administered 2013-05-18 – 2013-05-20 (×5): via INTRAVENOUS

## 2013-05-18 MED ORDER — ZOLPIDEM TARTRATE 5 MG PO TABS: 5.0000 mg | ORAL_TABLET | Freq: Every evening | ORAL | Status: DC | PRN

## 2013-05-18 MED ORDER — DOCUSATE SODIUM 100 MG PO CAPS
100.0000 mg | ORAL_CAPSULE | Freq: Every day | ORAL | Status: DC
  Filled 2013-05-18 (×2): qty 1

## 2013-05-18 NOTE — MAU Note (Signed)
Patient states the pain she is having is more epigastric and radiated to the mid back. Started last night at 1802 and is constant and getting worse.

## 2013-05-18 NOTE — MAU Note (Signed)
Patient states she has been having irregular contractions since last night. Denies bleeding or leaking and reports good fetal movement. States she started having vomiting this am and continues. No diarrhea.

## 2013-05-18 NOTE — L&D Delivery Note (Signed)
Delivery Note At 3:23 AM a viable female was delivered via Vaginal, Spontaneous Delivery (Presentation: Left Occiput Anterior).  APGAR: 8, 8; weight 4 lb 11.8 oz (2150 g).   Placenta status: Intact, Spontaneous.  Cord: 3 vessels with the following complications: true Knot.  Cord pH: 7.27  Anesthesia: Epidural  Episiotomy: None Lacerations: None Suture Repair: na Est. Blood Loss (mL): 250  Mom to postpartum.  Baby to NICU.  Pt progressed quickly to complete and spontaneously ruptured. She pushed with good maternal effort to deliver a liveborn female via NSVD over intact perineum.  NICU was present due to GA. Baby with spontaneous cry and good tone. True knot in the cord. Cord was clamped and cut and baby was handed over to awaiting NICU team. Placenta delivered spontaneously intact with 3V cord. No tears or complications. Mom to postpartum and baby to NICU on room air.  She is breast feeding and planning on nexplanon for contraception.   Patricia Williamson L 05/22/2013, 4:02 AM

## 2013-05-18 NOTE — H&P (Signed)
Obstetric Attending History and Physical  Chief Complaint:  Emesis and Abdominal Pain   HPI: Patricia Williamson is a 28 y.o. G2P1001 at [redacted]w[redacted]d who presents to maternity admissions reporting upper abdominal pain and emesis.  Had been observed from 12/29-12/31 for threatened PTL, was given BMZ on 12/29 &12/30, cervix was 1/long. Discharged yesterday on 05/17/13.  She still reports rare contractions, but denies leakage of fluid or vaginal bleeding. Good fetal movement.  She is more concerned about the worsening upper abdominal/epigastric pain and emesis.    RN Note Patient states she has been having vomiting this am and continues. No diarrhea.  Patient states the pain she is having is more epigastric and radiated to the mid back. Started last night at 1802 and is constant and getting worse. Also reports rare contractions since last night. Denies bleeding or leaking and reports good fetal movement.   Pregnancy Course: Receives care at [facility]  Pap March 2014 normal  Genetic Screen NT/IT: neg  Anatomic [place] Normal girl  Glucose Screen 2hr 81/116 normal  Flu vaccine 03/14/13  CF Screen Negative  GC/CT Initial:     -/-           36+wks:   GBS Positive on 05/15/14  Feed Preference Thinking about breast  Contraception Undecided, LARC vs. BTL  Circumcision n/a  Childbirth Classes Offered, declined  Pediatrician TMPA    Patient Active Problem List   Diagnosis Date Noted  . Group B Streptococcus carrier, +RV culture, currently pregnant 05/18/2013  . Abdominal pain, right upper quadrant 05/18/2013  . Cholelithiasis complicating pregnancy, antepartum 05/18/2013  . Preterm contractions 05/15/2013  . Asthma exacerbation 02/22/2013  . Bronchitis with asthma, acute 02/22/2013  . Rubella non-immune status, antepartum 12/01/2012  . Supervision of other normal pregnancy 11/29/2012    Past Medical History  Diagnosis Date  . Asthma   . Hx of constipation     OB History  Gravida  Para Term Preterm AB SAB TAB Ectopic Multiple Living  2 1 1       1     # Outcome Date GA Lbr Len/2nd Weight Sex Delivery Anes PTL Lv  2 CUR           1 TRM 01/02/10 [redacted]w[redacted]d  6 lb 5 oz (2.863 kg) F SVD EPI  Y      Past Surgical History  Procedure Laterality Date  . Cystoscopy w/ ureteral stent placement      Family History: Family History  Problem Relation Age of Onset  . Hypertension Mother   . Diabetes Mother   . Hyperlipidemia Mother   . Hypertension Brother   . Other Paternal Aunt     benign breast lump  . Diabetes Maternal Grandmother   . Heart disease Maternal Grandmother     Social History: History  Substance Use Topics  . Smoking status: Former Smoker    Types: Cigarettes  . Smokeless tobacco: Never Used  . Alcohol Use: No     Comment: occassional; not now    Allergies:  Allergies  Allergen Reactions  . Vinegar [Acetic Acid] Hives and Itching  . Sulfa Antibiotics Rash    Prescriptions prior to admission  Medication Sig Dispense Refill  . acetaminophen (TYLENOL) 500 MG tablet Take 1,000 mg by mouth every 6 (six) hours as needed for pain (For headache.).      Marland Kitchen albuterol (PROVENTIL HFA;VENTOLIN HFA) 108 (90 BASE) MCG/ACT inhaler Inhale 2 puffs into the lungs every 6 (six)  hours as needed for wheezing.  1 Inhaler  2    ROS: Pertinent findings in history of present illness.  Physical Exam  Blood pressure 129/68, pulse 72, temperature 98.4 F (36.9 C), resp. rate 20, last menstrual period 08/12/2012, SpO2 99.00%. GENERAL: Well-developed, well-nourished female in no acute distress.  HEENT: Normocephalic, atraumatic HEART: Regular rate and regular RESP: Normal effort, no breathing difficult ABDOMEN: Soft, gravid appropriate for gestational age, significant TTP in epigastric and RUQ area. +Murphy's sign EXTREMITIES: Nontender, no edema NEURO: Alert and oriented PELVIC: Deferred  FHT:  Baseline 145 , moderate variability, accelerations present, no  decelerations Contractions: uterine irritability noted on tocometer   Labs: Results for orders placed during the hospital encounter of 05/18/13 (from the past 24 hour(s))  COMPREHENSIVE METABOLIC PANEL     Status: Abnormal   Collection Time    05/18/13 12:24 PM      Result Value Range   Sodium 137  137 - 147 mEq/L   Potassium 3.5 (*) 3.7 - 5.3 mEq/L   Chloride 101  96 - 112 mEq/L   CO2 21  19 - 32 mEq/L   Glucose, Bld 99  70 - 99 mg/dL   BUN 5 (*) 6 - 23 mg/dL   Creatinine, Ser 4.090.36 (*) 0.50 - 1.10 mg/dL   Calcium 9.0  8.4 - 81.110.5 mg/dL   Total Protein 6.5  6.0 - 8.3 g/dL   Albumin 3.2 (*) 3.5 - 5.2 g/dL   AST 12  0 - 37 U/L   ALT 9  0 - 35 U/L   Alkaline Phosphatase 88  39 - 117 U/L   Total Bilirubin <0.2 (*) 0.3 - 1.2 mg/dL   GFR calc non Af Amer >90  >90 mL/min   GFR calc Af Amer >90  >90 mL/min  AMYLASE     Status: None   Collection Time    05/18/13 12:24 PM      Result Value Range   Amylase 35  0 - 105 U/L  LIPASE, BLOOD     Status: None   Collection Time    05/18/13 12:24 PM      Result Value Range   Lipase 22  11 - 59 U/L  CBC WITH DIFFERENTIAL     Status: Abnormal   Collection Time    05/18/13 12:25 PM      Result Value Range   WBC 21.9 (*) 4.0 - 10.5 K/uL   RBC 4.63  3.87 - 5.11 MIL/uL   Hemoglobin 13.6  12.0 - 15.0 g/dL   HCT 91.439.8  78.236.0 - 95.646.0 %   MCV 86.0  78.0 - 100.0 fL   MCH 29.4  26.0 - 34.0 pg   MCHC 34.2  30.0 - 36.0 g/dL   RDW 21.313.6  08.611.5 - 57.815.5 %   Platelets 252  150 - 400 K/uL   Neutrophils Relative % 81 (*) 43 - 77 %   Neutro Abs 17.7 (*) 1.7 - 7.7 K/uL   Lymphocytes Relative 10 (*) 12 - 46 %   Lymphs Abs 2.2  0.7 - 4.0 K/uL   Monocytes Relative 8  3 - 12 %   Monocytes Absolute 1.9 (*) 0.1 - 1.0 K/uL   Eosinophils Relative 0  0 - 5 %   Eosinophils Absolute 0.1  0.0 - 0.7 K/uL   Basophils Relative 0  0 - 1 %   Basophils Absolute 0.0  0.0 - 0.1 K/uL    Imaging:  05/16/2013   OBSTETRICAL  ULTRASOUND: Cervix 2.6 cm long without  funneling  05/18/2013   CLINICAL DATA:  Epigastric and back pain with vomiting.  Pregnant.  EXAM: US ABDOMEN LIMITED - RIGHT UPPER QUADRANT  COMPARISON:  None.  FINDINGS: Gallbladder  Several non mobile calculi are noted within the gallbladder lumen. There is no gallbladder wall thickening or sonographic Murphy sign.  Common bile duct  Diameter: 2.7 mm.  No intraductal calculi demonstrated.  Liver:  No focal lesion identified. Within normal limits in parenchymal echogenicity.  IMPRESSION: Cholelithiasis without evidence of cholecystitis or biliary dilatation.   Electronically Signed   By: Roxy Horseman M.D.   On: 05/18/2013 14:51   MAU Course: 1230  Zofran and Fentanyl ordered.  Labs, RUQ ultrasound ordered 1640  Labs within normal limits (increased WBC could be due to recent steroid administration). RUQ shows gall stones, no evidence of cholecystitis.  Patient still has 10/10 pain, and vomiting a lot.  Will admit for IV fluids, analgesia and antiemetics.  Assessment: 1. Cholelithiasis complicating pregnancy, antepartum   2. Abdominal pain, right upper quadrant     Plan: Admit to Antenatal IV fluids, NPO for now to avoid biliary colic Pain medications, antiemetics as needed Observe for any worsening symptoms/cholecystitis.  If symptoms worsen, may need General Surgery consult Routine antepartum care.  Tereso Newcomer, MD 05/18/2013 4:46 PM

## 2013-05-18 NOTE — MAU Provider Note (Signed)
Obstetric Attending MAU Note  Chief Complaint:  Emesis and Abdominal Pain   None    HPI: Patricia Williamson is a 28 y.o. G2P1001 at [redacted]w[redacted]d who presents to maternity admissions reporting upper abdominal pain and emesis.  Had been observed from 12/29-12/31 for threatened PTL, was given BMZ on 12/29 &12/30, cervix was 1/long. Discharged yesterday on 05/17/13.  She still reports rare contractions, but denies leakage of fluid or vaginal bleeding. Good fetal movement.  She is more concerned about the worsening upper abdominal/epigastric pain and emesis.    RN Note Patient states she has been having vomiting this am and continues. No diarrhea.  Patient states the pain she is having is more epigastric and radiated to the mid back. Started last night at 1802 and is constant and getting worse. Also reports rare contractions since last night. Denies bleeding or leaking and reports good fetal movement.   Pregnancy Course: Receives care at [facility]  Pap March 2014 normal  Genetic Screen NT/IT: neg  Anatomic [place] Normal girl  Glucose Screen 2hr 81/116 normal  Flu vaccine 03/14/13  CF Screen Negative  GC/CT Initial:     -/-           36+wks:   GBS Positive on 05/15/14  Feed Preference Thinking about breast  Contraception Undecided, LARC vs. BTL  Circumcision n/a  Childbirth Classes Offered, declined  Pediatrician TMPA    Patient Active Problem List   Diagnosis Date Noted  . Group B Streptococcus carrier, +RV culture, currently pregnant 05/18/2013  . Abdominal pain, right upper quadrant 05/18/2013  . Cholelithiasis complicating pregnancy, antepartum 05/18/2013  . Preterm contractions 05/15/2013  . Asthma exacerbation 02/22/2013  . Bronchitis with asthma, acute 02/22/2013  . Rubella non-immune status, antepartum 12/01/2012  . Supervision of other normal pregnancy 11/29/2012    Past Medical History  Diagnosis Date  . Asthma   . Hx of constipation     OB History  Gravida  Para Term Preterm AB SAB TAB Ectopic Multiple Living  2 1 1       1     # Outcome Date GA Lbr Len/2nd Weight Sex Delivery Anes PTL Lv  2 CUR           1 TRM 01/02/10 [redacted]w[redacted]d  6 lb 5 oz (2.863 kg) F SVD EPI  Y      Past Surgical History  Procedure Laterality Date  . Cystoscopy w/ ureteral stent placement      Family History: Family History  Problem Relation Age of Onset  . Hypertension Mother   . Diabetes Mother   . Hyperlipidemia Mother   . Hypertension Brother   . Other Paternal Aunt     benign breast lump  . Diabetes Maternal Grandmother   . Heart disease Maternal Grandmother     Social History: History  Substance Use Topics  . Smoking status: Former Smoker    Types: Cigarettes  . Smokeless tobacco: Never Used  . Alcohol Use: No     Comment: occassional; not now    Allergies:  Allergies  Allergen Reactions  . Vinegar [Acetic Acid] Hives and Itching  . Sulfa Antibiotics Rash    Prescriptions prior to admission  Medication Sig Dispense Refill  . acetaminophen (TYLENOL) 500 MG tablet Take 1,000 mg by mouth every 6 (six) hours as needed for pain (For headache.).      Marland Kitchen albuterol (PROVENTIL HFA;VENTOLIN HFA) 108 (90 BASE) MCG/ACT inhaler Inhale 2 puffs into the lungs  every 6 (six) hours as needed for wheezing.  1 Inhaler  2    ROS: Pertinent findings in history of present illness.  Physical Exam  Blood pressure 129/68, pulse 72, temperature 98.4 F (36.9 C), resp. rate 20, last menstrual period 08/12/2012, SpO2 99.00%. GENERAL: Well-developed, well-nourished female in no acute distress.  HEENT: Normocephalic, atraumatic HEART: Regular rate and regular RESP: Normal effort, no breathing difficult ABDOMEN: Soft, gravid appropriate for gestational age, significant TTP in epigastric and RUQ area. +Murphy's sign EXTREMITIES: Nontender, no edema NEURO: Alert and oriented PELVIC: Deferred  FHT:  Baseline 145 , moderate variability, accelerations present, no  decelerations Contractions: uterine irritability noted on tocometer   Labs: Results for orders placed during the hospital encounter of 05/18/13 (from the past 24 hour(s))  COMPREHENSIVE METABOLIC PANEL     Status: Abnormal   Collection Time    05/18/13 12:24 PM      Result Value Range   Sodium 137  137 - 147 mEq/L   Potassium 3.5 (*) 3.7 - 5.3 mEq/L   Chloride 101  96 - 112 mEq/L   CO2 21  19 - 32 mEq/L   Glucose, Bld 99  70 - 99 mg/dL   BUN 5 (*) 6 - 23 mg/dL   Creatinine, Ser 1.470.36 (*) 0.50 - 1.10 mg/dL   Calcium 9.0  8.4 - 82.910.5 mg/dL   Total Protein 6.5  6.0 - 8.3 g/dL   Albumin 3.2 (*) 3.5 - 5.2 g/dL   AST 12  0 - 37 U/L   ALT 9  0 - 35 U/L   Alkaline Phosphatase 88  39 - 117 U/L   Total Bilirubin <0.2 (*) 0.3 - 1.2 mg/dL   GFR calc non Af Amer >90  >90 mL/min   GFR calc Af Amer >90  >90 mL/min  AMYLASE     Status: None   Collection Time    05/18/13 12:24 PM      Result Value Range   Amylase 35  0 - 105 U/L  LIPASE, BLOOD     Status: None   Collection Time    05/18/13 12:24 PM      Result Value Range   Lipase 22  11 - 59 U/L  CBC WITH DIFFERENTIAL     Status: Abnormal   Collection Time    05/18/13 12:25 PM      Result Value Range   WBC 21.9 (*) 4.0 - 10.5 K/uL   RBC 4.63  3.87 - 5.11 MIL/uL   Hemoglobin 13.6  12.0 - 15.0 g/dL   HCT 56.239.8  13.036.0 - 86.546.0 %   MCV 86.0  78.0 - 100.0 fL   MCH 29.4  26.0 - 34.0 pg   MCHC 34.2  30.0 - 36.0 g/dL   RDW 78.413.6  69.611.5 - 29.515.5 %   Platelets 252  150 - 400 K/uL   Neutrophils Relative % 81 (*) 43 - 77 %   Neutro Abs 17.7 (*) 1.7 - 7.7 K/uL   Lymphocytes Relative 10 (*) 12 - 46 %   Lymphs Abs 2.2  0.7 - 4.0 K/uL   Monocytes Relative 8  3 - 12 %   Monocytes Absolute 1.9 (*) 0.1 - 1.0 K/uL   Eosinophils Relative 0  0 - 5 %   Eosinophils Absolute 0.1  0.0 - 0.7 K/uL   Basophils Relative 0  0 - 1 %   Basophils Absolute 0.0  0.0 - 0.1 K/uL    Imaging:  05/16/2013  OBSTETRICAL ULTRASOUND: Cervix 2.6 cm long without  funneling  05/18/2013   CLINICAL DATA:  Epigastric and back pain with vomiting.  Pregnant.  EXAM: US ABDOMEN LIMITED - RIGHT UPPER QUADRANT  COMPARISON:  None.  FINDINGS: Gallbladder  Several non mobile calculi are noted within the gallbladder lumen. There is no gallbladder wall thickening or sonographic Murphy sign.  Common bile duct  Diameter: 2.7 mm.  No intraductal calculi demonstrated.  Liver:  No focal lesion identified. Within normal limits in parenchymal echogenicity.  IMPRESSION: Cholelithiasis without evidence of cholecystitis or biliary dilatation.   Electronically Signed   By: Roxy Horseman M.D.   On: 05/18/2013 14:51   MAU Course: 1230  Zofran and Fentanyl ordered.  Labs, RUQ ultrasound ordered 1640  Labs within normal limits (increased WBC could be due to recent steroid administration). RUQ shows gall stones, no evidence of cholecystitis.  Patient still has 10/10 pain, and vomiting a lot.  Will admit for IV fluids, analgesia and antiemetics.  Assessment: 1. Cholelithiasis complicating pregnancy, antepartum   2. Abdominal pain, right upper quadrant     Plan: Admit to Antenatal IV fluids, NPO for now to avoid biliary colic Pain medications, antiemetics as needed Observe for any worsening symptoms/cholecystitis.  If symptoms worsen, may need General Surgery consult Routine antepartum care.  Tereso Newcomer, MD 05/18/2013 4:46 PM

## 2013-05-19 DIAGNOSIS — O4700 False labor before 37 completed weeks of gestation, unspecified trimester: Secondary | ICD-10-CM

## 2013-05-19 DIAGNOSIS — O9989 Other specified diseases and conditions complicating pregnancy, childbirth and the puerperium: Secondary | ICD-10-CM

## 2013-05-19 DIAGNOSIS — R1011 Right upper quadrant pain: Secondary | ICD-10-CM

## 2013-05-19 DIAGNOSIS — O99891 Other specified diseases and conditions complicating pregnancy: Secondary | ICD-10-CM

## 2013-05-19 DIAGNOSIS — K8020 Calculus of gallbladder without cholecystitis without obstruction: Principal | ICD-10-CM

## 2013-05-19 LAB — ABO/RH: ABO/RH(D): A POS

## 2013-05-19 NOTE — H&P (Signed)
Attestation of Attending Supervision of Advanced Practitioner (CNM/NP): Evaluation and management procedures were performed by the Advanced Practitioner under my supervision and collaboration.  I have reviewed the Advanced Practitioner's note and chart, and I agree with the management and plan.  Madysen Faircloth 05/19/2013 12:32 PM

## 2013-05-19 NOTE — Progress Notes (Signed)
UR completed 

## 2013-05-19 NOTE — Progress Notes (Signed)
FACULTY PRACTICE ANTEPARTUM COMPREHENSIVE PROGRESS NOTE  Patricia Williamson is a 27 y.o. G2P1001 at [redacted]w[redacted]d  who is admitted for symptomatic cholelithiasis. Estimated Date of Delivery: 07/03/13 Fetal presentation is cephalic.  Length of Stay:  1 Days. 05/18/2013  Subjective: Patient still reports having some pain, decreased from yesterday. 7-8/10.  Ameliorated on Dilaudid. Patient reports good fetal movement.  She reports rare uterine contractions, no bleeding and no loss of fluid per vagina.  Vitals:  Blood pressure 103/50, pulse 70, temperature 98.6 F (37 C), temperature source Oral, resp. rate 20, height 5' (1.524 m), weight 194 lb (87.998 kg), last menstrual period 08/12/2012, SpO2 99.00%. Physical Examination: General appearance - alert, well appearing, and in no distress Abdomen - soft, +moderate epigastric/RUQ tenderness, gravid, NT fundus Cervical Exam - Deferred Extremities - extremities normal, atraumatic, no cyanosis or edema and Homans sign is negative, no sign of DVT with DTRs 2+ on the bilateral legs Membranes:intact  Fetal Monitoring:  Baseline: 145 bpm, Variability: Moderate, Accelerations: Reactive and Decelerations: Absent Tocometry: No contractions  Results for orders placed during the hospital encounter of 05/18/13 (from the past 48 hour(s))  COMPREHENSIVE METABOLIC PANEL     Status: Abnormal   Collection Time    05/18/13 12:24 PM      Result Value Range   Sodium 137  137 - 147 mEq/L   Comment: Please note change in reference range.   Potassium 3.5 (*) 3.7 - 5.3 mEq/L   Comment: Please note change in reference range.   Chloride 101  96 - 112 mEq/L   CO2 21  19 - 32 mEq/L   Glucose, Bld 99  70 - 99 mg/dL   BUN 5 (*) 6 - 23 mg/dL   Creatinine, Ser 0.36 (*) 0.50 - 1.10 mg/dL   Calcium 9.0  8.4 - 10.5 mg/dL   Total Protein 6.5  6.0 - 8.3 g/dL   Albumin 3.2 (*) 3.5 - 5.2 g/dL   AST 12  0 - 37 U/L   ALT 9  0 - 35 U/L   Alkaline Phosphatase 88  39 - 117 U/L   Total  Bilirubin <0.2 (*) 0.3 - 1.2 mg/dL   Comment: REPEATED TO VERIFY   GFR calc non Af Amer >90  >90 mL/min   GFR calc Af Amer >90  >90 mL/min   Comment: (NOTE)     The eGFR has been calculated using the CKD EPI equation.     This calculation has not been validated in all clinical situations.     eGFR's persistently <90 mL/min signify possible Chronic Kidney     Disease.  AMYLASE     Status: None   Collection Time    05/18/13 12:24 PM      Result Value Range   Amylase 35  0 - 105 U/L   Comment: Performed at [facility]  LIPASE, BLOOD     Status: None   Collection Time    05/18/13 12:24 PM      Result Value Range   Lipase 22  11 - 59 U/L   Comment: Performed at  Hospital  CBC WITH DIFFERENTIAL     Status: Abnormal   Collection Time    05/18/13 12:25 PM      Result Value Range   WBC 21.9 (*) 4.0 - 10.5 K/uL   RBC 4.63  3.87 - 5.11 MIL/uL   Hemoglobin 13.6  12.0 - 15.0 g/dL   HCT 39.8  36.0 - 46.0 %     MCV 86.0  78.0 - 100.0 fL   MCH 29.4  26.0 - 34.0 pg   MCHC 34.2  30.0 - 36.0 g/dL   RDW 13.6  11.5 - 15.5 %   Platelets 252  150 - 400 K/uL   Neutrophils Relative % 81 (*) 43 - 77 %   Neutro Abs 17.7 (*) 1.7 - 7.7 K/uL   Lymphocytes Relative 10 (*) 12 - 46 %   Lymphs Abs 2.2  0.7 - 4.0 K/uL   Monocytes Relative 8  3 - 12 %   Monocytes Absolute 1.9 (*) 0.1 - 1.0 K/uL   Eosinophils Relative 0  0 - 5 %   Eosinophils Absolute 0.1  0.0 - 0.7 K/uL   Basophils Relative 0  0 - 1 %   Basophils Absolute 0.0  0.0 - 0.1 K/uL  TYPE AND SCREEN     Status: None   Collection Time    05/18/13  5:40 PM      Result Value Range   ABO/RH(D) A POS     Antibody Screen NEG     Sample Expiration 05/21/2013    ABO/RH     Status: None   Collection Time    05/18/13  5:40 PM      Result Value Range   ABO/RH(D) A POS      US Abdomen Limited Ruq  05/18/2013   CLINICAL DATA:  Epigastric and back pain with vomiting.  Pregnant.  EXAM: US ABDOMEN LIMITED - RIGHT UPPER QUADRANT   COMPARISON:  None.  FINDINGS: Gallbladder  Several non mobile calculi are noted within the gallbladder lumen. There is no gallbladder wall thickening or sonographic Murphy sign.  Common bile duct  Diameter: 2.7 mm.  No intraductal calculi demonstrated.  Liver:  No focal lesion identified. Within normal limits in parenchymal echogenicity.  IMPRESSION: Cholelithiasis without evidence of cholecystitis or biliary dilatation.   Electronically Signed   By: Camie Patience M.D.   On: 05/18/2013 14:51    Current scheduled medications . docusate sodium  100 mg Oral Daily  . ondansetron (ZOFRAN) IV  4 mg Intravenous Once  . prenatal multivitamin  1 tablet Oral Q1200   I have reviewed the patient's current medications.  ASSESSMENT: Patient Active Problem List   Diagnosis Date Noted  . Group B Streptococcus carrier, +RV culture, currently pregnant 05/18/2013  . Abdominal pain, right upper quadrant 05/18/2013  . Cholelithiasis complicating pregnancy, antepartum 05/18/2013  . Preterm contractions 05/15/2013  . Asthma exacerbation 02/22/2013  . Bronchitis with asthma, acute 02/22/2013  . Rubella non-immune status, antepartum 12/01/2012  . Supervision of other normal pregnancy 11/29/2012    PLAN: Continue Dilaudid, antiemetics; try to give oral forms Will attempt sips of clears and ADAT, avoid fatty foods Continue routine antenatal care.   Verita Schneiders, MD, [place] Attending [place], [place]

## 2013-05-20 ENCOUNTER — Other Ambulatory Visit: Payer: Self-pay | Admitting: Family Medicine

## 2013-05-20 MED ORDER — HYDROMORPHONE HCL 4 MG PO TABS: 4.0000 mg | ORAL_TABLET | ORAL | Status: DC | PRN

## 2013-05-20 MED ORDER — DSS 100 MG PO CAPS: 100.0000 mg | ORAL_CAPSULE | Freq: Every day | ORAL | Status: DC

## 2013-05-20 MED ORDER — PRENATAL MULTIVITAMIN CH: 1.0000 | ORAL_TABLET | Freq: Every day | ORAL | Status: DC

## 2013-05-20 NOTE — Discharge Summary (Signed)
Physician Discharge Summary  Patient ID: Patricia Williamson MRN: [id number] DOB/AGE: 1985-09-22 27 y.o.  Admit date: 05/18/2013 Discharge date: 05/20/2013  Admission Diagnoses: abdominal pain in pregnancy, cholelithiasis  Discharge Diagnoses: same Principal Problem:   Abdominal pain, right upper quadrant Active Problems:   Preterm contractions   Group B Streptococcus carrier, +RV culture, currently pregnant   Cholelithiasis complicating pregnancy, antepartum   Discharged Condition: good  Hospital Course: Patient admitted secondary to abdominal pain in pregnancy. Ultrasound demonstrated the presence of gallstones, without evidence of cholecystitis. Patient was admitted for pain management. She was able to eat several meals on HD#1 without significant discomfort. Pain improved significantly and on HD#2 patient requested to be discharged. Instructions were given to pain to modify diet and decrease fat content. Patient is scheduled to follow up with Ob/GYN on 1/7  Consults: None  Significant Diagnostic Studies: radiology: Ultrasound:   IMPRESSION:  Cholelithiasis without evidence of cholecystitis or biliary  dilatation.    Treatments: IV hydration and analgesia: Dilaudid  Discharge Exam: Blood pressure 119/61, pulse 75, temperature 98.5 F (36.9 C), temperature source Oral, resp. rate 18, height 5' (1.524 m), weight 194 lb (87.998 kg), last menstrual period 08/12/2012, SpO2 99.00%. General appearance: alert, cooperative and no distress Resp: clear to auscultation bilaterally Cardio: regular rate and rhythm Extremities: extremities normal, atraumatic, no cyanosis or edema and Homans sign is negative, no sign of DVT Abdomen: soft, gravid, NT  FHT: baseline 135, mod variability, +accels, no decels Toco: no contractions  Disposition: 01-Home or Self Care   Future Appointments Provider Department Dept Phone   05/24/2013 11:15 AM Jacklyn ShellFrances Cresenzo-Dishmon, CNM [facility]  [phone]       Medication List         acetaminophen 500 MG tablet  Commonly known as:  TYLENOL  Take 1,000 mg by mouth every 6 (six) hours as needed for pain (For headache.).     albuterol 108 (90 BASE) MCG/ACT inhaler  Commonly known as:  PROVENTIL HFA;VENTOLIN HFA  Inhale 2 puffs into the lungs every 6 (six) hours as needed for wheezing.     DSS 100 MG Caps  Take 100 mg by mouth daily.     HYDROmorphone 4 MG tablet  Commonly known as:  DILAUDID  Take 1 tablet (4 mg total) by mouth every 3 (three) hours as needed for severe pain.     prenatal multivitamin Tabs tablet  Take 1 tablet by mouth daily at 12 noon.           Follow-up Information   [facility] (as scheduled on 05/24/2013)    Specialty:  Obstetrics and Gynecology   Contact information:   [address] [place] [address] [phone]      Signed: Catalina AntiguaCONSTANT,Daje Stark 05/20/2013, 6:53 AM

## 2013-05-20 NOTE — Discharge Instructions (Signed)
Cholelithiasis °Cholelithiasis (also called gallstones) is a form of gallbladder disease in which gallstones form in your gallbladder. The gallbladder is an organ that stores bile made in the liver, which helps digest fats. Gallstones begin as small crystals and slowly grow into stones. Gallstone pain occurs when the gallbladder spasms and a gallstone is blocking the duct. Pain can also occur when a stone passes out of the duct.  °RISK FACTORS °· Being female.   °· Having multiple pregnancies. Health care providers sometimes advise removing diseased gallbladders before future pregnancies.   °· Being obese. °· Eating a diet heavy in fried foods and fat.   °· Being older than 60 years and increasing age.   °· Prolonged use of medicines containing female hormones.   °· Having diabetes mellitus.   °· Rapidly losing weight.   °· Having a family history of gallstones (heredity).   °SYMPTOMS °· Nausea.   °· Vomiting. °· Abdominal pain.   °· Yellowing of the skin (jaundice).   °· Sudden pain. It may persist from several minutes to several hours. °· Fever.   °· Tenderness to the touch.  °In some cases, when gallstones do not move into the bile duct, people have no pain or symptoms. These are called "silent" gallstones.  °TREATMENT °Silent gallstones do not need treatment. In severe cases, emergency surgery may be required. Options for treatment include: °· Surgery to remove the gallbladder. This is the most common treatment. °· Medicines. These do not always work and may take 6 12 months or more to work. °· Shock wave treatment (extracorporeal biliary lithotripsy). In this treatment an ultrasound machine sends shock waves to the gallbladder to break gallstones into smaller pieces that can pass into the intestines or be dissolved by medicine. °HOME CARE INSTRUCTIONS  °· Only take over-the-counter or prescription medicines for pain, discomfort, or fever as directed by your health care provider.   °· Follow a low-fat diet until  seen again by your health care provider. Fat causes the gallbladder to contract, which can result in pain.   °· Follow up with your health care provider as directed. Attacks are almost always recurrent and surgery is usually required for permanent treatment.   °SEEK IMMEDIATE MEDICAL CARE IF:  °· Your pain increases and is not controlled by medicines.   °· You have a fever or persistent symptoms for more than 2 3 days.   °· You have a fever and your symptoms suddenly get worse.   °· You have persistent nausea and vomiting.   °MAKE SURE YOU:  °· Understand these instructions. °· Will watch your condition. °· Will get help right away if you are not doing well or get worse. °Document Released: 04/30/2005 Document Revised: 01/04/2013 Document Reviewed: 10/26/2012 °ExitCare® Patient Information ©2014 [employer]. ° °

## 2013-05-20 NOTE — Progress Notes (Signed)
Multiple attempts to print Dilaudid prescription unsuccessful by Dr Shawnie PonsPratt and myself.  Pt has NOT received prescription at this time, despite duplicate orders.

## 2013-05-22 ENCOUNTER — Inpatient Hospital Stay (HOSPITAL_COMMUNITY)
Admission: AD | Admit: 2013-05-22 | Discharge: 2013-05-23 | DRG: 775 | Disposition: A | Discharge status: Home / Self Care | Payer: Medicaid Other | Source: Ambulatory Visit | Attending: Obstetrics & Gynecology | Admitting: Obstetrics & Gynecology

## 2013-05-22 ENCOUNTER — Encounter (HOSPITAL_COMMUNITY): Payer: Medicaid Other | Admitting: Anesthesiology

## 2013-05-22 ENCOUNTER — Inpatient Hospital Stay (HOSPITAL_COMMUNITY): Payer: Medicaid Other | Admitting: Anesthesiology

## 2013-05-22 ENCOUNTER — Encounter (HOSPITAL_COMMUNITY): Payer: Self-pay | Admitting: *Deleted

## 2013-05-22 DIAGNOSIS — Z3490 Encounter for supervision of normal pregnancy, unspecified, unspecified trimester: Secondary | ICD-10-CM

## 2013-05-22 DIAGNOSIS — Z87891 Personal history of nicotine dependence: Secondary | ICD-10-CM

## 2013-05-22 DIAGNOSIS — O26619 Liver and biliary tract disorders in pregnancy, unspecified trimester: Secondary | ICD-10-CM

## 2013-05-22 DIAGNOSIS — O9989 Other specified diseases and conditions complicating pregnancy, childbirth and the puerperium: Secondary | ICD-10-CM

## 2013-05-22 DIAGNOSIS — O99892 Other specified diseases and conditions complicating childbirth: Secondary | ICD-10-CM | POA: Diagnosis present

## 2013-05-22 DIAGNOSIS — Z22330 Carrier of Group B streptococcus: Secondary | ICD-10-CM

## 2013-05-22 DIAGNOSIS — R1011 Right upper quadrant pain: Secondary | ICD-10-CM

## 2013-05-22 DIAGNOSIS — O26899 Other specified pregnancy related conditions, unspecified trimester: Secondary | ICD-10-CM | POA: Diagnosis present

## 2013-05-22 DIAGNOSIS — K8020 Calculus of gallbladder without cholecystitis without obstruction: Secondary | ICD-10-CM

## 2013-05-22 DIAGNOSIS — Z3483 Encounter for supervision of other normal pregnancy, third trimester: Secondary | ICD-10-CM

## 2013-05-22 DIAGNOSIS — O99820 Streptococcus B carrier state complicating pregnancy: Secondary | ICD-10-CM

## 2013-05-22 LAB — COMPREHENSIVE METABOLIC PANEL
ALBUMIN: 3 g/dL — AB (ref 3.5–5.2)
ALK PHOS: 107 U/L (ref 39–117)
ALT: 66 U/L — ABNORMAL HIGH (ref 0–35)
AST: 47 U/L — AB (ref 0–37)
BUN: 7 mg/dL (ref 6–23)
CO2: 22 mEq/L (ref 19–32)
Calcium: 9.2 mg/dL (ref 8.4–10.5)
Chloride: 99 mEq/L (ref 96–112)
Creatinine, Ser: 0.42 mg/dL — ABNORMAL LOW (ref 0.50–1.10)
GFR calc Af Amer: >90 mL/min (ref >90)
GFR calc non Af Amer: >90 mL/min (ref >90)
Glucose, Bld: 98 mg/dL (ref 70–99)
POTASSIUM: 3.9 meq/L (ref 3.7–5.3)
Sodium: 136 mEq/L — ABNORMAL LOW (ref 137–147)
TOTAL PROTEIN: 6.6 g/dL (ref 6.0–8.3)
Total Bilirubin: 0.4 mg/dL (ref 0.3–1.2)

## 2013-05-22 LAB — CBC
HCT: 41.6 % (ref 36.0–46.0)
Hemoglobin: 14.8 g/dL (ref 12.0–15.0)
MCH: 29.7 pg (ref 26.0–34.0)
MCHC: 35.6 g/dL (ref 30.0–36.0)
MCV: 83.4 fL (ref 78.0–100.0)
Platelets: 228 10*3/uL (ref 150–400)
RBC: 4.99 MIL/uL (ref 3.87–5.11)
RDW: 12.9 % (ref 11.5–15.5)
WBC: 21.5 10*3/uL — ABNORMAL HIGH (ref 4.0–10.5)

## 2013-05-22 LAB — PROTEIN / CREATININE RATIO, URINE
Creatinine, Urine: 97.33 mg/dL
Protein Creatinine Ratio: 0.29 — ABNORMAL HIGH (ref 0.00–0.15)
TOTAL PROTEIN, URINE: 28.5 mg/dL

## 2013-05-22 LAB — RPR: RPR Ser Ql: NONREACTIVE

## 2013-05-22 MED ORDER — OXYTOCIN 40 UNITS IN LACTATED RINGERS INFUSION - SIMPLE MED
62.5000 mL/h | INTRAVENOUS | Status: DC
  Filled 2013-05-22: qty 1000

## 2013-05-22 MED ORDER — PNEUMOCOCCAL VAC POLYVALENT 25 MCG/0.5ML IJ INJ
0.5000 mL | INJECTION | INTRAMUSCULAR | Status: AC
  Administered 2013-05-23: 0.5 mL via INTRAMUSCULAR
  Filled 2013-05-22: qty 0.5

## 2013-05-22 MED ORDER — DIPHENHYDRAMINE HCL 50 MG/ML IJ SOLN: 12.5000 mg | INTRAMUSCULAR | Status: DC | PRN

## 2013-05-22 MED ORDER — DIBUCAINE 1 % RE OINT: 1.0000 "application " | TOPICAL_OINTMENT | RECTAL | Status: DC | PRN

## 2013-05-22 MED ORDER — TETANUS-DIPHTH-ACELL PERTUSSIS 5-2.5-18.5 LF-MCG/0.5 IM SUSP
0.5000 mL | Freq: Once | INTRAMUSCULAR | Status: AC
  Administered 2013-05-23: 0.5 mL via INTRAMUSCULAR
  Filled 2013-05-22: qty 0.5

## 2013-05-22 MED ORDER — OXYTOCIN BOLUS FROM INFUSION
500.0000 mL | INTRAVENOUS | Status: DC
Start: 2013-05-22 — End: 2013-05-23

## 2013-05-22 MED ORDER — SODIUM CHLORIDE 0.9 % IV SOLN
2.0000 g | Freq: Once | INTRAVENOUS | Status: AC
  Administered 2013-05-22: 2 g via INTRAVENOUS
  Filled 2013-05-22: qty 2000

## 2013-05-22 MED ORDER — SODIUM BICARBONATE 8.4 % IV SOLN
INTRAVENOUS | Status: DC | PRN
  Administered 2013-05-22: 5 mL via EPIDURAL

## 2013-05-22 MED ORDER — ACETAMINOPHEN 325 MG PO TABS: 650.0000 mg | ORAL_TABLET | ORAL | Status: DC | PRN

## 2013-05-22 MED ORDER — PRENATAL MULTIVITAMIN CH
1.0000 | ORAL_TABLET | Freq: Every day | ORAL | Status: DC
  Administered 2013-05-22: 1 via ORAL
  Filled 2013-05-22: qty 1

## 2013-05-22 MED ORDER — SENNOSIDES-DOCUSATE SODIUM 8.6-50 MG PO TABS
2.0000 | ORAL_TABLET | ORAL | Status: DC
  Administered 2013-05-23: 2 via ORAL
  Filled 2013-05-22: qty 2

## 2013-05-22 MED ORDER — ONDANSETRON HCL 4 MG PO TABS: 4.0000 mg | ORAL_TABLET | ORAL | Status: DC | PRN

## 2013-05-22 MED ORDER — ZOLPIDEM TARTRATE 5 MG PO TABS: 5.0000 mg | ORAL_TABLET | Freq: Every evening | ORAL | Status: DC | PRN

## 2013-05-22 MED ORDER — IBUPROFEN 600 MG PO TABS
600.0000 mg | ORAL_TABLET | Freq: Four times a day (QID) | ORAL | Status: DC
  Administered 2013-05-22 – 2013-05-23 (×2): 600 mg via ORAL
  Filled 2013-05-22 (×5): qty 1

## 2013-05-22 MED ORDER — LIDOCAINE HCL (PF) 1 % IJ SOLN
30.0000 mL | INTRAMUSCULAR | Status: DC | PRN
  Filled 2013-05-22 (×2): qty 30

## 2013-05-22 MED ORDER — EPHEDRINE 5 MG/ML INJ
10.0000 mg | INTRAVENOUS | Status: DC | PRN
  Filled 2013-05-22: qty 2

## 2013-05-22 MED ORDER — MEASLES, MUMPS & RUBELLA VAC ~~LOC~~ INJ
0.5000 mL | INJECTION | Freq: Once | SUBCUTANEOUS | Status: AC
  Administered 2013-05-23: 0.5 mL via SUBCUTANEOUS
  Filled 2013-05-22: qty 0.5

## 2013-05-22 MED ORDER — CITRIC ACID-SODIUM CITRATE 334-500 MG/5ML PO SOLN: 30.0000 mL | ORAL | Status: DC | PRN

## 2013-05-22 MED ORDER — OXYCODONE-ACETAMINOPHEN 5-325 MG PO TABS: 1.0000 | ORAL_TABLET | ORAL | Status: DC | PRN

## 2013-05-22 MED ORDER — ONDANSETRON HCL 4 MG/2ML IJ SOLN: 4.0000 mg | INTRAMUSCULAR | Status: DC | PRN

## 2013-05-22 MED ORDER — PHENYLEPHRINE 40 MCG/ML (10ML) SYRINGE FOR IV PUSH (FOR BLOOD PRESSURE SUPPORT)
80.0000 ug | PREFILLED_SYRINGE | INTRAVENOUS | Status: DC | PRN
  Filled 2013-05-22: qty 2

## 2013-05-22 MED ORDER — LACTATED RINGERS IV SOLN
INTRAVENOUS | Status: DC
  Administered 2013-05-22: 01:00:00 via INTRAVENOUS

## 2013-05-22 MED ORDER — IBUPROFEN 600 MG PO TABS
600.0000 mg | ORAL_TABLET | Freq: Four times a day (QID) | ORAL | Status: DC | PRN
  Administered 2013-05-22 – 2013-05-23 (×3): 600 mg via ORAL

## 2013-05-22 MED ORDER — FLEET ENEMA 7-19 GM/118ML RE ENEM: 1.0000 | ENEMA | RECTAL | Status: DC | PRN

## 2013-05-22 MED ORDER — DIPHENHYDRAMINE HCL 25 MG PO CAPS: 25.0000 mg | ORAL_CAPSULE | Freq: Four times a day (QID) | ORAL | Status: DC | PRN

## 2013-05-22 MED ORDER — ONDANSETRON HCL 4 MG/2ML IJ SOLN
4.0000 mg | Freq: Four times a day (QID) | INTRAMUSCULAR | Status: DC | PRN
Start: 2013-05-22 — End: 2013-05-23

## 2013-05-22 MED ORDER — LACTATED RINGERS IV SOLN
500.0000 mL | Freq: Once | INTRAVENOUS | Status: DC
Start: 2013-05-22 — End: 2013-05-22

## 2013-05-22 MED ORDER — WITCH HAZEL-GLYCERIN EX PADS: 1.0000 "application " | MEDICATED_PAD | CUTANEOUS | Status: DC | PRN

## 2013-05-22 MED ORDER — SIMETHICONE 80 MG PO CHEW
80.0000 mg | CHEWABLE_TABLET | ORAL | Status: DC | PRN
Start: 2013-05-22 — End: 2013-05-23

## 2013-05-22 MED ORDER — BENZOCAINE-MENTHOL 20-0.5 % EX AERO
1.0000 | INHALATION_SPRAY | CUTANEOUS | Status: DC | PRN
Start: 2013-05-22 — End: 2013-05-23
  Filled 2013-05-22: qty 56

## 2013-05-22 MED ORDER — LACTATED RINGERS IV SOLN: 500.0000 mL | INTRAVENOUS | Status: DC | PRN

## 2013-05-22 MED ORDER — FENTANYL 2.5 MCG/ML BUPIVACAINE 1/10 % EPIDURAL INFUSION (WH - ANES)
14.0000 mL/h | INTRAMUSCULAR | Status: DC | PRN
  Administered 2013-05-22: 14 mL/h via EPIDURAL

## 2013-05-22 MED ORDER — LANOLIN HYDROUS EX OINT: TOPICAL_OINTMENT | CUTANEOUS | Status: DC | PRN

## 2013-05-22 NOTE — Anesthesia Postprocedure Evaluation (Signed)
  Anesthesia Post-op Note  Patient: Patricia Williamson  Procedure(s) Performed: * No procedures listed *  Patient Location: Women's Unit  Anesthesia Type:Epidural  Level of Consciousness: awake, alert  and patient cooperative  Airway and Oxygen Therapy: Patient Spontanous Breathing  Post-op Pain: none  Post-op Assessment: Patient's Cardiovascular Status Stable, Respiratory Function Stable, Patent Airway, No signs of Nausea or vomiting, Adequate PO intake, Pain level controlled, No headache, No backache, No residual numbness and No residual motor weakness  Post-op Vital Signs: Reviewed and stable  Complications: No apparent anesthesia complications

## 2013-05-22 NOTE — H&P (Signed)
Patricia Williamson is a 28 y.o. female G2P1001 with IUP at 4970w0d presenting for contractions.   Pt states has been having painful contractions since this AM that worsened over the last 2 hours.  Now occuring regularly every 1-2 minutes. Having bloody show and mucus on and off.  +FM.  No LOF.    Has a hx of threatened PTL and was admitted last week with positive FFN and received BMZ on 12/29 and 12/30. Her cervix at that time was 1cm and long. She was then discharged on 12/31 and returned on 1/1 with vomiting and abdominal pain and diagnosed with cholelithiasis. She was discharged home on 05/20/13.   [facility] Uncomplicated until the recent admissions. GBS positive on admission last time.   Prenatal History/Complications:  Past Medical History: Past Medical History  Diagnosis Date  . Asthma   . Hx of constipation     Past Surgical History: Past Surgical History  Procedure Laterality Date  . Cystoscopy w/ ureteral stent placement      Obstetrical History: OB History   Grav Para Term Preterm Abortions TAB SAB Ect Mult Living   2 1 1       1      G1- 38 weeks, NSVD, 6lb5oz  G2- current   Social History: History   Social History  . Marital Status: Married    Spouse Name: N/A    Number of Children: N/A  . Years of Education: N/A   Social History Main Topics  . Smoking status: Former Smoker    Types: Cigarettes  . Smokeless tobacco: Never Used  . Alcohol Use: No     Comment: occassional; not now  . Drug Use: No  . Sexual Activity: Yes    Birth Control/ Protection: None   Other Topics Concern  . None   Social History Narrative  . None    Family History: Family History  Problem Relation Age of Onset  . Hypertension Mother   . Diabetes Mother   . Hyperlipidemia Mother   . Hypertension Brother   . Other Paternal Aunt     benign breast lump  . Diabetes Maternal Grandmother   . Heart disease Maternal Grandmother     Allergies: Allergies  Allergen Reactions  .  Vinegar [Acetic Acid] Hives and Itching  . Sulfa Antibiotics Rash    Prescriptions prior to admission  Medication Sig Dispense Refill  . acetaminophen (TYLENOL) 500 MG tablet Take 1,000 mg by mouth every 6 (six) hours as needed for pain (For headache.).      Marland Kitchen. albuterol (PROVENTIL HFA;VENTOLIN HFA) 108 (90 BASE) MCG/ACT inhaler Inhale 2 puffs into the lungs every 6 (six) hours as needed for wheezing.  1 Inhaler  2  . docusate sodium 100 MG CAPS Take 100 mg by mouth daily.  30 capsule  1  . HYDROmorphone (DILAUDID) 4 MG tablet Take 1 tablet (4 mg total) by mouth every 4 (four) hours as needed for severe pain.  30 tablet  0  . Prenatal Vit-Fe Fumarate-FA (PRENATAL MULTIVITAMIN) TABS tablet Take 1 tablet by mouth daily at 12 noon.  30 tablet  4     Review of Systems  All systems reviewed and negative except as stated in HPI    Blood pressure 158/96, pulse 61, temperature 98.7 F (37.1 C), temperature source Oral, resp. rate 20, last menstrual period 08/12/2012. General appearance: alert and mild distress appears in pain Lungs: clear to auscultation bilaterally Heart: regular rate and rhythm Abdomen: soft,  non-tender; bowel sounds normal Pelvic: BBOW/100/+1 Extremities: Homans sign is negative, no sign of DVT DTR's normal Presentation: cephalic on US Fetal monitoringBaseline: 150 bpm, Variability: Good {> 6 bpm), Accelerations: Reactive and Decelerations: Absent Uterine activityregular every 1-51min   Dilation: 10 Station: +1 Exam by:: Dr. Tinnie Gens    Prenatal labs: ABO, Rh: --/--/A POS, A POS (01/01 1740) Antibody: NEG (01/01 1740) Rubella:   RPR: NON REACTIVE (12/30 0810)  HBsAg: NEGATIVE (07/15 1200)  HIV: NON REACTIVE (11/26 0914)  GBS:    1 hr Glucola normal Genetic screening  neg Anatomy US normal    Assessment: Patricia Williamson is a 28 y.o. G2P1001 with an IUP at [redacted]w[redacted]d presenting for preterm labor.    Plan: 1) preterm labor - 34 weeks and now complete with  BBOW - routine orders - hold on augmentation if possible  2) FWB - cat I tracing although less than 20 min thus far - GBS pos- will treat with AMP - NICU notified of admission and imminent delivery  3) Anticipate SVD   4) elevated BP - likely due to pain - will send CMP and spot prot/cr ratio   Patricia Williamson L, MD 05/22/2013, 1:12 AM

## 2013-05-22 NOTE — Anesthesia Procedure Notes (Signed)

## 2013-05-22 NOTE — H&P (Signed)
Attestation of Attending Supervision of Advanced Practitioner (CNM/NP): Evaluation and management procedures were performed by the Advanced Practitioner under my supervision and collaboration. I have reviewed the Advanced Practitioner's note and chart, and I agree with the management and plan.  Marvens Hollars H. 3:09 AM

## 2013-05-22 NOTE — MAU Note (Signed)
Patient reports contractions on and off all day. Contractions currently feel back to back. Having some mucous like vaginal bleeding. Denies gushes of fluid.

## 2013-05-22 NOTE — Progress Notes (Signed)
Rowe Clackisha D Hyder is a 28 y.o. G2P1001 at 3938w0d admitted for Preterm labor  Subjective: Pt comfortable with epidural  Objective: BP 141/66  Pulse 64  Temp(Src) 98.5 F (36.9 C) (Oral)  Resp 18  Ht 5' (1.524 m)  SpO2 99%  LMP 08/12/2012     Called for fetal bradycardia.  Amniotic sac was ruptured and ISE placed.  FH was able to be trace better and baseline returned to 160 with variability and accelerations. !)/100/0  Labs: Lab Results  Component Value Date   WBC 21.5* 05/22/2013   HGB 14.8 05/22/2013   HCT 41.6 05/22/2013   MCV 83.4 05/22/2013   PLT 228 05/22/2013    Assessment / Plan: Spontaneous labor, progressing normally  Labor: Progressing normally Preeclampsia:  no signs or symptoms of toxicity Fetal Wellbeing:  Category II Pain Control:  Epidural I/D:  n/a Anticipated MOD:  NSVD  Dequincy Born H. 05/22/2013, 3:05 AM

## 2013-05-22 NOTE — Lactation Note (Signed)
This note was copied from the chart of Patricia Shiela Mayerisha Vultaggio. Lactation Consultation Note  Patient Name: Patricia Williamson [id number]'s Date: 05/22/2013     Maternal Data    Feeding    LATCH Score/Interventions                      Lactation Tools Discussed/Used     Consult Status      Alfred LevinsLee, Montie Gelardi Anne 05/22/2013, 5:11 PM

## 2013-05-22 NOTE — Anesthesia Preprocedure Evaluation (Signed)
Anesthesia Evaluation  Patient identified by MRN, date of birth, ID band Patient awake    Reviewed: Allergy & Precautions, H&P , Patient's Chart, lab work & pertinent test results  Airway Mallampati: II  TM Distance: >3 FB Neck ROM: full    Dental  (+) Teeth Intact   Pulmonary asthma , former smoker,  breath sounds clear to auscultation        Cardiovascular Rhythm:regular Rate:Normal     Neuro/Psych    GI/Hepatic   Endo/Other  Morbid obesity  Renal/GU      Musculoskeletal   Abdominal   Peds  Hematology   Anesthesia Other Findings       Reproductive/Obstetrics (+) Pregnancy                            Anesthesia Physical Anesthesia Plan  ASA: III  Anesthesia Plan: Epidural   Post-op Pain Management:    Induction:   Airway Management Planned:   Additional Equipment:   Intra-op Plan:   Post-operative Plan:   Informed Consent: I have reviewed the patients History and Physical, chart, labs and discussed the procedure including the risks, benefits and alternatives for the proposed anesthesia with the patient or authorized representative who has indicated his/her understanding and acceptance.   Dental Advisory Given  Plan Discussed with:   Anesthesia Plan Comments: (Labs checked- platelets confirmed with RN in room. Fetal heart tracing, per RN, reported to be stable enough for sitting procedure. Discussed epidural, and patient consents to the procedure:  included risk of possible headache,backache, failed block, allergic reaction, and nerve injury. This patient was asked if she had any questions or concerns before the procedure started.)        Anesthesia Quick Evaluation  

## 2013-05-22 NOTE — Plan of Care (Signed)
Problem: Phase I Progression Outcomes Goal: Pain controlled with appropriate interventions Outcome: Completed/Met Date Met:  05/22/13 Pain control good at this time with po Motrin Goal: Voiding adequately Outcome: Completed/Met Date Met:  05/22/13 Voided large amt before leaving L&D Goal: OOB as tolerated unless otherwise ordered Outcome: Completed/Met Date Met:  05/22/13 Tolerated up in room for peri care well Goal: Initial discharge plan identified Outcome: Completed/Met Date Met:  05/22/13 VSS Bleeding WNL Pain controlled Understands self care and when to call the MD Understands F/U care

## 2013-05-22 NOTE — Progress Notes (Signed)
Ur chart review completed.  

## 2013-05-23 ENCOUNTER — Encounter (HOSPITAL_COMMUNITY): Payer: Self-pay | Admitting: *Deleted

## 2013-05-23 MED ORDER — IBUPROFEN 600 MG PO TABS: 600.0000 mg | ORAL_TABLET | Freq: Four times a day (QID) | ORAL | Status: DC | PRN

## 2013-05-23 NOTE — Discharge Summary (Signed)
Obstetric Discharge Summary Reason for Admission: Preterm labor Prenatal Procedures: NST and steroids Intrapartum Procedures: spontaneous vaginal delivery Postpartum Procedures: none Complications-Operative and Postpartum: none  Hospital Course: Pt had a hx of threatened PTL and was admitted last week with positive FFN and received BMZ on 12/29 and 12/30. Her cervix at that time was 1cm and long. She was then discharged on 12/31 and returned on 1/1 with vomiting and abdominal pain and diagnosed with cholelithiasis. She was discharged home on 05/20/13.   pt returned on 1/5 with painful contractions and was complete. Pt successfully had a vaginal delivery as below. Met milestones PP, and desires discharge home. Wants Nexplanon for contraception and is pumping for feeds. Infant in NICU   Delivery Note At 3:23 AM a viable female was delivered via Vaginal, Spontaneous Delivery (Presentation: Left Occiput Anterior).  APGAR: 8, 8; weight 4 lb 11.8 oz (2150 g).   Placenta status: Intact, Spontaneous.  Cord: 3 vessels with the following complications: true Knot.  Cord pH: 7.27  Anesthesia: Epidural  Episiotomy: None Lacerations: None Suture Repair: na Est. Blood Loss (mL): 250  Mom to postpartum.  Baby to NICU.  Pt progressed quickly to complete and spontaneously ruptured. She pushed with good maternal effort to deliver a liveborn female via NSVD over intact perineum.  NICU was present due to [place]. Baby with spontaneous cry and good tone. True knot in the cord. Cord was clamped and cut and baby was handed over to awaiting NICU team. Placenta delivered spontaneously intact with 3V cord. No tears or complications. Mom to postpartum and baby to NICU on room air.  She is breast feeding and planning on nexplanon for contraception.   BECK, KELI L 05/22/2013, 4:02 AM      H/H: Lab Results  Component Value Date/Time   HGB 14.8 05/22/2013  1:20 AM   HCT 41.6 05/22/2013  1:20 AM      Discharge  Diagnoses: preterm delviery  Discharge Information: Date: 11/27/2010 Activity: pelvic rest Diet: routine  Medications: PNV and Ibuprofen Breast feeding:  Yes Condition: stable Instructions: refer to handout Discharge to: home    Future Appointments Provider Department Dept Phone   05/24/2013 11:15 AM Christin Fudge, CNM [facility] [phone]       Medication List         acetaminophen 500 MG tablet  Commonly known as:  TYLENOL  Take 1,000 mg by mouth every 6 (six) hours as needed for pain (For headache.).     albuterol 108 (90 BASE) MCG/ACT inhaler  Commonly known as:  PROVENTIL HFA;VENTOLIN HFA  Inhale 2 puffs into the lungs every 6 (six) hours as needed for wheezing.     DSS 100 MG Caps  Take 100 mg by mouth daily.     HYDROmorphone 4 MG tablet  Commonly known as:  DILAUDID  Take 1 tablet (4 mg total) by mouth every 4 (four) hours as needed for severe pain.     ibuprofen 600 MG tablet  Commonly known as:  ADVIL,MOTRIN  Take 1 tablet (600 mg total) by mouth every 6 (six) hours as needed (pain scale < 4).     prenatal multivitamin Tabs tablet  Take 1 tablet by mouth daily at 12 noon.           Follow-up Information   Follow up with [facility] In 4 weeks.   Specialty:  Obstetrics and Gynecology   Contact information:   [address] [place] [address]  [phone]      Dakari Cregger RYAN 05/23/2013,7:58 AM

## 2013-05-23 NOTE — Progress Notes (Signed)
Pt is discharged in the care of husband. Downstairs per ambulatory. Denies heavy bleeding, pain or discomfort  Infant to remain in [place] Discharged instructions with Rx were given to Pt. Understands all instructions well. Questions were asked amd  Answered. Stable.

## 2013-05-23 NOTE — Discharge Instructions (Signed)

## 2013-05-23 NOTE — Discharge Summary (Signed)
Attestation of Attending Supervision of Advanced Practitioner (CNM/NP): Evaluation and management procedures were performed by the Advanced Practitioner under my supervision and collaboration.  I have reviewed the Advanced Practitioner's note and chart, and I agree with the management and plan.  Francetta Ilg 05/23/2013 12:23 PM   

## 2013-05-24 ENCOUNTER — Encounter: Payer: Medicaid Other | Admitting: Advanced Practice Midwife

## 2013-06-12 ENCOUNTER — Ambulatory Visit: Payer: Medicaid Other | Admitting: Women's Health

## 2013-06-12 ENCOUNTER — Telehealth: Payer: Self-pay | Admitting: Women's Health

## 2013-06-12 NOTE — Telephone Encounter (Signed)
Pt states mcd inactive, needs to be seen for postpartum and birth control. Pt states spoke with case worker and said they were back up and it would be 1 mos  Before getting mcd reactive. Amy Zachery DauerBarnes, Front staff, got pt name and number and will call pt back.

## 2013-06-13 ENCOUNTER — Encounter: Payer: Medicaid Other | Admitting: Women's Health

## 2013-06-26 ENCOUNTER — Ambulatory Visit: Payer: Medicaid Other | Admitting: Women's Health

## 2013-07-06 ENCOUNTER — Ambulatory Visit (INDEPENDENT_AMBULATORY_CARE_PROVIDER_SITE_OTHER): Payer: Medicaid Other | Admitting: Advanced Practice Midwife

## 2013-07-06 ENCOUNTER — Encounter: Payer: Self-pay | Admitting: Advanced Practice Midwife

## 2013-07-06 VITALS — BP 110/78 | Ht 60.0 in | Wt 174.0 lb

## 2013-07-06 DIAGNOSIS — Z3202 Encounter for pregnancy test, result negative: Secondary | ICD-10-CM

## 2013-07-06 LAB — POCT URINE PREGNANCY: Preg Test, Ur: NEGATIVE

## 2013-07-06 NOTE — Progress Notes (Signed)
Patricia Williamson is a 28 y.o. who presents for a postpartum visit. She is 6 weeks postpartum following a spontaneous vaginal delivery. I have fully reviewed the prenatal and intrapartum course. The delivery was at 34.0 gestational weeks, spontaneous PTL/PTD. Discussed 17p for future pregnancies Anesthesia: none. Postpartum course has been uneventful.  No more problems from gallstones since delivery . Baby's course has been uneventful. Baby is feeding by bottle. Bleeding: no bleeding. Just finishing her period.  Bowel function is normal. Bladder function is normal. Patient is sexually active. Last intercourse Saturday, and then she started her period. Will remain abstinate until IUD. Contraception method is IUD. Postpartum depression screening: negative.    Review of Systems   Constitutional: Negative for fever and chills Eyes: Negative for visual disturbances Respiratory: Negative for shortness of breath, dyspnea Cardiovascular: Negative for chest pain or palpitations  Gastrointestinal: Negative for vomiting, diarrhea and constipation Genitourinary: Negative for dysuria and urgency Musculoskeletal: Negative for back pain, joint pain, myalgias  Neurological: Negative for dizziness and headaches   Objective:     Filed Vitals:   07/06/13 0930  BP: 110/78   General:  alert, cooperative and no distress   Breasts:  negative  Lungs: clear to auscultation bilaterally  Heart:  regular rate and rhythm  Abdomen: Soft, nontender   Vulva:  normal  Vagina: normal vagina  Cervix:  closed  Corpus: Well involuted     Rectal Exam: No  hemorrhoids        Assessment:    normal postpartum exam.  Plan:    1. Contraception: condoms and IUD 2. Follow up in: monday for IUD  OK for IUD because she had sex before her period started.

## 2013-07-06 NOTE — Patient Instructions (Signed)
Levonorgestrel intrauterine device (IUD) What is this medicine? LEVONORGESTREL IUD (LEE voe nor jes trel) is a contraceptive (birth control) device. The device is placed inside the uterus by a healthcare professional. It is used to prevent pregnancy and can also be used to treat heavy bleeding that occurs during your period. Depending on the device, it can be used for 3 to 5 years. This medicine may be used for other purposes; ask your health care provider or pharmacist if you have questions. COMMON BRAND NAME(S): Mirena, Skyla What should I tell my health care provider before I take this medicine? They need to know if you have any of these conditions: -abnormal Pap smear -cancer of the breast, uterus, or cervix -diabetes -endometritis -genital or pelvic infection now or in the past -have more than one sexual partner or your partner has more than one partner -heart disease -history of an ectopic or tubal pregnancy -immune system problems -IUD in place -liver disease or tumor -problems with blood clots or take blood-thinners -use intravenous drugs -uterus of unusual shape -vaginal bleeding that has not been explained -an unusual or allergic reaction to levonorgestrel, other hormones, silicone, or polyethylene, medicines, foods, dyes, or preservatives -pregnant or trying to get pregnant -breast-feeding How should I use this medicine? This device is placed inside the uterus by a health care professional. Talk to your pediatrician regarding the use of this medicine in children. Special care may be needed. Overdosage: If you think you have taken too much of this medicine contact a poison control center or emergency room at once. NOTE: This medicine is only for you. Do not share this medicine with others. What if I miss a dose? This does not apply. What may interact with this medicine? Do not take this medicine with any of the following  medications: -amprenavir -bosentan -fosamprenavir This medicine may also interact with the following medications: -aprepitant -barbiturate medicines for inducing sleep or treating seizures -bexarotene -griseofulvin -medicines to treat seizures like carbamazepine, ethotoin, felbamate, oxcarbazepine, phenytoin, topiramate -modafinil -pioglitazone -rifabutin -rifampin -rifapentine -some medicines to treat HIV infection like atazanavir, indinavir, lopinavir, nelfinavir, tipranavir, ritonavir -St. John's wort -warfarin This list may not describe all possible interactions. Give your health care provider a list of all the medicines, herbs, non-prescription drugs, or dietary supplements you use. Also tell them if you smoke, drink alcohol, or use illegal drugs. Some items may interact with your medicine. What should I watch for while using this medicine? Visit your doctor or health care professional for regular check ups. See your doctor if you or your partner has sexual contact with others, becomes HIV positive, or gets a sexual transmitted disease. This product does not protect you against HIV infection (AIDS) or other sexually transmitted diseases. You can check the placement of the IUD yourself by reaching up to the top of your vagina with clean fingers to feel the threads. Do not pull on the threads. It is a good habit to check placement after each menstrual period. Call your doctor right away if you feel more of the IUD than just the threads or if you cannot feel the threads at all. The IUD may come out by itself. You may become pregnant if the device comes out. If you notice that the IUD has come out use a backup birth control method like condoms and call your health care provider. Using tampons will not change the position of the IUD and are okay to use during your period. What side effects may I   notice from receiving this medicine? Side effects that you should report to your doctor or  health care professional as soon as possible: -allergic reactions like skin rash, itching or hives, swelling of the face, lips, or tongue -fever, flu-like symptoms -genital sores -high blood pressure -no menstrual period for 6 weeks during use -pain, swelling, warmth in the leg -pelvic pain or tenderness -severe or sudden headache -signs of pregnancy -stomach cramping -sudden shortness of breath -trouble with balance, talking, or walking -unusual vaginal bleeding, discharge -yellowing of the eyes or skin Side effects that usually do not require medical attention (report to your doctor or health care professional if they continue or are bothersome): -acne -breast pain -change in sex drive or performance -changes in weight -cramping, dizziness, or faintness while the device is being inserted -headache -irregular menstrual bleeding within first 3 to 6 months of use -nausea This list may not describe all possible side effects. Call your doctor for medical advice about side effects. You may report side effects to FDA at 1-800-FDA-1088. Where should I keep my medicine? This does not apply. NOTE: This sheet is a summary. It may not cover all possible information. If you have questions about this medicine, talk to your doctor, pharmacist, or health care provider.  2014, Elsevier/Gold Standard. (2011-06-04 13:54:04)  

## 2013-07-06 NOTE — Addendum Note (Signed)
Addended by: Jacklyn ShellRESENZO-DISHMON, Loanne Emery on: 07/06/2013 10:50 AM   Modules accepted: Level of Service

## 2013-07-10 ENCOUNTER — Encounter: Payer: Self-pay | Admitting: Women's Health

## 2013-07-10 ENCOUNTER — Ambulatory Visit (INDEPENDENT_AMBULATORY_CARE_PROVIDER_SITE_OTHER): Payer: Medicaid Other | Admitting: Women's Health

## 2013-07-10 VITALS — BP 104/64 | Ht 60.0 in | Wt 173.5 lb

## 2013-07-10 DIAGNOSIS — Z30430 Encounter for insertion of intrauterine contraceptive device: Secondary | ICD-10-CM

## 2013-07-10 DIAGNOSIS — Z3202 Encounter for pregnancy test, result negative: Secondary | ICD-10-CM

## 2013-07-10 LAB — POCT URINE PREGNANCY: Preg Test, Ur: NEGATIVE

## 2013-07-10 NOTE — Progress Notes (Signed)
Patient ID: Patricia Williamson, female   DOB: 1986/04/14, 28 y.o.   MRN: [id number] Patricia Williamson is a 28 y.o. year old [id number] Caucasian female who presents for placement of a Mirena IUD. She had a preterm SVD on 05/22/13.  Patient's last menstrual period was 06/29/2013. BP 104/64  Ht 5' (1.524 m)  Wt 173 lb 8 oz (78.699 kg)  BMI 33.88 kg/m2  LMP 06/29/2013  Breastfeeding? No Last sexual intercourse was 2/14 and she started her period that night, and pregnancy test today was neg  The risks and benefits of the method and placement have been thouroughly reviewed with the patient and all questions were answered.  Specifically the patient is aware of failure rate of 05/998, expulsion of the IUD and of possible perforation.  The patient is aware of irregular bleeding due to the method and understands the incidence of irregular bleeding diminishes with time.  Signed copy of informed consent in chart.   Time out was performed.  A graves speculum was placed in the vagina.  The cervix was visualized, prepped using Betadine, and grasped with a single tooth tenaculum. The uterus was found to be anteroflexed and it sounded to 9 cm.  Mirena IUD placed per manufacturer's recommendations.   The strings were trimmed to 3 cm.  Sonogram was performed and the proper placement of the IUD was verified via transvaginal u/s by myself and LHE  The patient was given post procedure instructions, including signs and symptoms of infection and to check for the strings after each menses or each month, and refraining from intercourse or anything in the vagina for 3 days.  She was given a Mirena care card with date Mirena placed, and date Mirena to be removed.  She is scheduled for a f/u appointment in 4 weeks.  Marge DuncansBooker, Delisha Peaden Randall CNM, [facility] IncWHNP-BC 07/10/2013 2:57 PM

## 2013-07-10 NOTE — Patient Instructions (Signed)
Nothing in vagina for 3 days (no sex, douching, tampons, etc...)  Check your strings once a month to make sure you can feel them, if you are not able to please let us know  If you develop a fever of 100.4 or more in the next few weeks, or if you develop severe abdominal pain, please let [facility] know  Use a backup method of birth control, such as condoms, for 2 weeks   Intrauterine Device Insertion, Care After Refer to this sheet in the next few weeks. These instructions provide you with information on caring for yourself after your procedure. Your health care provider may also give you more specific instructions. Your treatment has been planned according to current medical practices, but problems sometimes occur. Call your health care provider if you have any problems or questions after your procedure. WHAT TO EXPECT AFTER THE PROCEDURE Insertion of the IUD may cause some discomfort, such as cramping. The cramping should improve after the IUD is in place. You may have bleeding after the procedure. This is normal. It varies from light spotting for a few days to menstrual-like bleeding. When the IUD is in place, a string will extend past the cervix into the vagina for 1 2 inches. The strings should not bother you or your partner. If they do, talk to your health care provider.  HOME CARE INSTRUCTIONS   Check your intrauterine device (IUD) to make sure it is in place before you resume sexual activity. You should be able to feel the strings. If you cannot feel the strings, something may be wrong. The IUD may have fallen out of the uterus, or the uterus may have been punctured (perforated) during placement. Also, if the strings are getting longer, it may mean that the IUD is being forced out of the uterus. You no longer have full protection from pregnancy if any of these problems occur.  You may resume sexual intercourse if you are not having problems with the IUD. The copper IUD is considered immediately  effective, and the hormone IUD works right away if inserted within 7 days of your period starting. You will need to use a backup method of birth control for 7 days if the IUD in inserted at any other time in your cycle.  Continue to check that the IUD is still in place by feeling for the strings after every menstrual period.  You may need to take pain medicine such as acetaminophen or ibuprofen. Only take medicines as directed by your health care provider. SEEK MEDICAL CARE IF:   You have bleeding that is heavier or lasts longer than a normal menstrual cycle.  You have a fever.  You have increasing cramps or abdominal pain not relieved with medicine.  You have abdominal pain that does not seem to be related to the same area of earlier cramping and pain.  You are lightheaded, unusually weak, or faint.  You have abnormal vaginal discharge or smells.  You have pain during sexual intercourse.  You cannot feel the IUD strings, or the IUD string has gotten longer.  You feel the IUD at the opening of the cervix in the vagina.  You think you are pregnant, or you miss your menstrual period.  The IUD string is hurting your sex partner. MAKE SURE YOU:  Understand these instructions.  Will watch your condition.  Will get help right away if you are not doing well or get worse. Document Released: 12/31/2010 Document Revised: 02/22/2013 Document Reviewed: 10/23/2012  ExitCare Patient Information 2014 [employer] [place] [place]  Levonorgestrel intrauterine device (IUD) What is this medicine? LEVONORGESTREL IUD (LEE voe nor jes trel) is a contraceptive (birth control) device. The device is placed inside the uterus by a healthcare professional. It is used to prevent pregnancy and can also be used to treat heavy bleeding that occurs during your period. Depending on the device, it can be used for 3 to 5 years. This medicine may be used for other purposes; ask your health care provider or pharmacist if you  have questions. COMMON BRAND NAME(S): Gretta CoolMirena, Skyla What should I tell my health care provider before I take this medicine? They need to know if you have any of these conditions: -abnormal Pap smear -cancer of the breast, uterus, or cervix -diabetes -endometritis -genital or pelvic infection now or in the past -have more than one sexual partner or your partner has more than one partner -heart disease -history of an ectopic or tubal pregnancy -immune system problems -IUD in place -liver disease or tumor -problems with blood clots or take blood-thinners -use intravenous drugs -uterus of unusual shape -vaginal bleeding that has not been explained -an unusual or allergic reaction to levonorgestrel, other hormones, silicone, or polyethylene, medicines, foods, dyes, or preservatives -pregnant or trying to get pregnant -breast-feeding How should I use this medicine? This device is placed inside the uterus by a health care professional. Talk to your pediatrician regarding the use of this medicine in children. Special care may be needed. Overdosage: If you think you have taken too much of this medicine contact a poison control center or emergency room at once. NOTE: This medicine is only for you. Do not share this medicine with others. What if I miss a dose? This does not apply. What may interact with this medicine? Do not take this medicine with any of the following medications: -amprenavir -bosentan -fosamprenavir This medicine may also interact with the following medications: -aprepitant -barbiturate medicines for inducing sleep or treating seizures -bexarotene -griseofulvin -medicines to treat seizures like carbamazepine, ethotoin, felbamate, oxcarbazepine, phenytoin, topiramate -modafinil -pioglitazone -rifabutin -rifampin -rifapentine -some medicines to treat HIV infection like atazanavir, indinavir, lopinavir, nelfinavir, tipranavir, ritonavir -[facility]  wort -warfarin This list may not describe all possible interactions. Give your health care provider a list of all the medicines, herbs, non-prescription drugs, or dietary supplements you use. Also tell them if you smoke, drink alcohol, or use illegal drugs. Some items may interact with your medicine. What should I watch for while using this medicine? Visit your doctor or health care professional for regular check ups. See your doctor if you or your partner has sexual contact with others, becomes HIV positive, or gets a sexual transmitted disease. This product does not protect you against HIV infection (AIDS) or other sexually transmitted diseases. You can check the placement of the IUD yourself by reaching up to the top of your vagina with clean fingers to feel the threads. Do not pull on the threads. It is a good habit to check placement after each menstrual period. Call your doctor right away if you feel more of the IUD than just the threads or if you cannot feel the threads at all. The IUD may come out by itself. You may become pregnant if the device comes out. If you notice that the IUD has come out use a backup birth control method like condoms and call your health care provider. Using tampons will not change the position of the IUD and are okay to use  during your period. What side effects may I notice from receiving this medicine? Side effects that you should report to your doctor or health care professional as soon as possible: -allergic reactions like skin rash, itching or hives, swelling of the face, lips, or tongue -fever, flu-like symptoms -genital sores -high blood pressure -no menstrual period for 6 weeks during use -pain, swelling, warmth in the leg -pelvic pain or tenderness -severe or sudden headache -signs of pregnancy -stomach cramping -sudden shortness of breath -trouble with balance, talking, or walking -unusual vaginal bleeding, discharge -yellowing of the eyes or  skin Side effects that usually do not require medical attention (report to your doctor or health care professional if they continue or are bothersome): -acne -breast pain -change in sex drive or performance -changes in weight -cramping, dizziness, or faintness while the device is being inserted -headache -irregular menstrual bleeding within first 3 to 6 months of use -nausea This list may not describe all possible side effects. Call your doctor for medical advice about side effects. You may report side effects to FDA at 1-800-FDA-1088. Where should I keep my medicine? This does not apply. NOTE: This sheet is a summary. It may not cover all possible information. If you have questions about this medicine, talk to your doctor, pharmacist, or health care provider.  2014, [employer]. (2011-06-04 13:54:04)   

## 2013-08-03 ENCOUNTER — Encounter: Payer: Self-pay | Admitting: Obstetrics and Gynecology

## 2013-08-03 ENCOUNTER — Ambulatory Visit (INDEPENDENT_AMBULATORY_CARE_PROVIDER_SITE_OTHER): Payer: Medicaid Other | Admitting: Obstetrics and Gynecology

## 2013-08-03 VITALS — BP 122/64 | Ht 64.0 in | Wt 172.2 lb

## 2013-08-03 DIAGNOSIS — N939 Abnormal uterine and vaginal bleeding, unspecified: Secondary | ICD-10-CM | POA: Insufficient documentation

## 2013-08-03 DIAGNOSIS — Z30431 Encounter for routine checking of intrauterine contraceptive device: Secondary | ICD-10-CM

## 2013-08-03 MED ORDER — ESTRADIOL 1 MG PO TABS: 1.0000 mg | ORAL_TABLET | Freq: Every day | ORAL | Status: DC

## 2013-08-03 MED ORDER — DOXYCYCLINE HYCLATE 100 MG PO CAPS: 100.0000 mg | ORAL_CAPSULE | Freq: Two times a day (BID) | ORAL | Status: DC

## 2013-08-03 NOTE — Progress Notes (Signed)
This chart was scribed by Leone PayorSonum Patel, Medical Scribe, for Dr. Christin BachJohn Wakisha Alberts on 08/03/13 at 11:43 AM. This chart was reviewed by Dr. Christin BachJohn Charlott Calvario and is accurate.    GYNECOLOGY CLINIC PROGRESS NOTE  History:  28 y.o. [id number] here today for today for IUD check; Mirena IUD was placed  3.5 weeks ago. Pt states she began having gradually worsening vaginal bleeding with associated pain beginning in the last 1-2 days. Patient is concerned the IUD is causing this pain.   The following portions of the patient's history were reviewed and updated as appropriate: allergies, current medications, past family history, past medical history, past social history, past surgical history and problem list..  Review of Systems:  Pertinent items are noted in HPI.  Objective:  Physical Exam Blood pressure 122/64, height 5\' 4"  (1.626 m), weight 172 lb 3.2 oz (78.109 kg), last menstrual period 06/29/2013, not currently breastfeeding. Gen: NAD Abd: Soft, nontender and nondistended Pelvic: Normal appearing external genitalia; normal appearing vaginal mucosa and cervix. Non tender uterus. IUD strings visualized, about 2 cm in length outside cervix.  Bedside ultrasound shows the IUD in the center of the inside lining of the uterus.  Uterus is nontender on u/s and bimanual exam Assessment & Plan:  Normal IUD check. Patient to keep IUD in place for five years; can come in for removal if she desires pregnancy within the next five years. Routine preventative health maintenance measures emphasized.  P: Estrace 1mg  BID xPRN bleeding      doubt infection, but will cover with abx x 5 day as precaution.

## 2013-08-07 ENCOUNTER — Ambulatory Visit: Payer: Medicaid Other | Admitting: Women's Health

## 2013-09-14 ENCOUNTER — Ambulatory Visit (INDEPENDENT_AMBULATORY_CARE_PROVIDER_SITE_OTHER): Payer: Medicaid Other | Admitting: Advanced Practice Midwife

## 2013-09-14 ENCOUNTER — Encounter: Payer: Self-pay | Admitting: Advanced Practice Midwife

## 2013-09-14 VITALS — BP 120/80 | Ht 60.0 in

## 2013-09-14 DIAGNOSIS — Z30431 Encounter for routine checking of intrauterine contraceptive device: Secondary | ICD-10-CM

## 2013-09-14 MED ORDER — BUTALBITAL-APAP-CAFFEINE 50-325-40 MG PO TABS: 1.0000 | ORAL_TABLET | Freq: Four times a day (QID) | ORAL | Status: DC | PRN

## 2013-09-14 NOTE — Progress Notes (Signed)
Patricia Williamson 27 y.o.  Filed Vitals:   09/14/13 1336  BP: 120/80   Past Medical History  Diagnosis Date  . Asthma   . Hx of constipation    Past Surgical History  Procedure Laterality Date  . Cystoscopy w/ ureteral stent placement     family history includes Diabetes in her maternal grandmother and mother; Heart disease in her maternal grandmother; Hyperlipidemia in her mother; Hypertension in her brother and mother; Other in her paternal aunt. Current outpatient prescriptions:albuterol (PROVENTIL HFA;VENTOLIN HFA) 108 (90 BASE) MCG/ACT inhaler, Inhale 2 puffs into the lungs every 6 (six) hours as needed for wheezing., Disp: 1 Inhaler, Rfl: 2;  ibuprofen (ADVIL,MOTRIN) 200 MG tablet, Take 200 mg by mouth as needed., Disp: , Rfl: ;  levonorgestrel (MIRENA) 20 MCG/24HR IUD, 1 each by Intrauterine route once., Disp: , Rfl:  acetaminophen (TYLENOL) 500 MG tablet, Take 1,000 mg by mouth every 6 (six) hours as needed for pain (For headache.)., Disp: , Rfl: ;  docusate sodium 100 MG CAPS, Take 100 mg by mouth daily., Disp: 30 capsule, Rfl: 1;  doxycycline (VIBRAMYCIN) 100 MG capsule, Take 1 capsule (100 mg total) by mouth 2 (two) times daily., Disp: 14 capsule, Rfl: 0;  estradiol (ESTRACE) 1 MG tablet, Take 1 tablet (1 mg total) by mouth daily., Disp: 30 tablet, Rfl: 0 ibuprofen (ADVIL,MOTRIN) 600 MG tablet, Take 1 tablet (600 mg total) by mouth every 6 (six) hours as needed (pain scale < 4)., Disp: 30 tablet, Rfl: 0  HPI:  Got Mirena 2 months ago.  Concerned that she may be having SE:  Daily headaches since IUD and legs hurting (aching, goes numb sometimes) off and on for several weeks. Had nexplanon in the past without SE Also wants the strings trimmed  PE: SSE:  Strings flush on cx, only about 1cm long.  Moved to another part of her cx   ASSESSMENT: SE of IUD  PLAN: Place Nexplanon in 3 weeks, remove IUD a few days later  Meds ordered this encounter  Medications  . ibuprofen  (ADVIL,MOTRIN) 200 MG tablet    Sig: Take 200 mg by mouth as needed.  . butalbital-acetaminophen-caffeine (FIORICET) 50-325-40 MG per tablet    Sig: Take 1-2 tablets by mouth every 6 (six) hours as needed for headache.    Dispense:  30 tablet    Refill:  1    Order Specific Question:  Supervising Provider    Answer:  Duane LopeEURE, LUTHER H [[id number]]

## 2013-10-05 ENCOUNTER — Encounter (INDEPENDENT_AMBULATORY_CARE_PROVIDER_SITE_OTHER): Payer: Medicaid Other | Admitting: Advanced Practice Midwife

## 2013-10-05 DIAGNOSIS — Z30017 Encounter for initial prescription of implantable subdermal contraceptive: Secondary | ICD-10-CM

## 2013-10-10 ENCOUNTER — Encounter: Payer: Medicaid Other | Admitting: Advanced Practice Midwife

## 2013-10-11 ENCOUNTER — Ambulatory Visit (INDEPENDENT_AMBULATORY_CARE_PROVIDER_SITE_OTHER): Payer: Medicaid Other | Admitting: Adult Health

## 2013-10-11 ENCOUNTER — Encounter: Payer: Self-pay | Admitting: Adult Health

## 2013-10-11 ENCOUNTER — Ambulatory Visit: Payer: Medicaid Other | Admitting: Adult Health

## 2013-10-11 VITALS — BP 110/60 | Ht 60.0 in | Wt 177.3 lb

## 2013-10-11 DIAGNOSIS — Z302 Encounter for sterilization: Secondary | ICD-10-CM | POA: Insufficient documentation

## 2013-10-11 DIAGNOSIS — Z30017 Encounter for initial prescription of implantable subdermal contraceptive: Secondary | ICD-10-CM

## 2013-10-11 DIAGNOSIS — Z3202 Encounter for pregnancy test, result negative: Secondary | ICD-10-CM

## 2013-10-11 HISTORY — DX: Encounter for initial prescription of implantable subdermal contraceptive: Z30.017

## 2013-10-11 LAB — POCT URINE PREGNANCY: PREG TEST UR: NEGATIVE

## 2013-10-11 NOTE — Progress Notes (Addendum)
Subjective:     Patient ID: Patricia Williamson, female   DOB: 1986-03-03, 28 y.o.   MRN: [id number]  HPI Patricia Williamson is a 28 year old white female in for nexplanon insertion, she does not like the IUD.  Review of Systems See HPI Reviewed past medical,surgical, social and family history. Reviewed medications and allergies.     Objective:   Physical Exam BP 110/60  Ht 5' (1.524 m)  Wt 177 lb 4.8 oz (80.423 kg)  BMI 34.63 kg/m2UPT negative, consent signed, time out called, then Left arm cleansed with betadine, and injected with 1.5 cc 2% lidocaine and waited til numb. Nexplanon easily inserted, easily palpated by pt and provider, and steri strips applied. Pressure dressing applied.    Assessment:     Nexplanon insertion  Lot #[id number], exp 03/2016    Plan:     Use condoms x 2 weeks, keep clean and dry x 24 hours, no heavy lifting, keep steri strips on x 72 hours, Keep pressure dressing on x 24 hours. Follow up prn problems.   Return 6/1 as scheduled for IUD removal.

## 2013-10-11 NOTE — Patient Instructions (Signed)
Use condoms x 2 weeks, keep clean and dry x 24 hours, no heavy lifting, keep steri strips on x 72 hours, Keep pressure dressing on x 24 hours. Follow up prn problems. Return 6/1 for IUD removal

## 2013-10-16 ENCOUNTER — Ambulatory Visit (INDEPENDENT_AMBULATORY_CARE_PROVIDER_SITE_OTHER): Payer: Medicaid Other | Admitting: Adult Health

## 2013-10-16 ENCOUNTER — Encounter: Payer: Self-pay | Admitting: Adult Health

## 2013-10-16 VITALS — BP 120/80 | Ht 60.0 in | Wt 167.0 lb

## 2013-10-16 DIAGNOSIS — Z30432 Encounter for removal of intrauterine contraceptive device: Secondary | ICD-10-CM

## 2013-10-16 NOTE — Progress Notes (Signed)
Subjective:     Patient ID: Patricia Williamson, female   DOB: 27-Jan-1986, 28 y.o.   MRN: [id number]  HPI Patricia Williamson is a 28 year old white female in for IUD removal, she had nexplanon inserted last week.  Review of Systems See HPI Reviewed past medical,surgical, social and family history. Reviewed medications and allergies.     Objective:   Physical Exam BP 120/80  Ht 5' (1.524 m)  Wt 167 lb (75.751 kg)  BMI 32.62 kg/m2  Breastfeeding? NoVerbal consent obtained.   IUD easily removed with forceps.  Assessment:     IUD removal    Plan:     Use condoms Return in 1 year for pap and physical

## 2013-10-16 NOTE — Patient Instructions (Signed)
Use condoms Return in 1 year for pap and physical

## 2014-03-02 ENCOUNTER — Other Ambulatory Visit: Payer: Self-pay

## 2014-03-19 ENCOUNTER — Encounter: Payer: Self-pay | Admitting: Adult Health

## 2014-03-27 ENCOUNTER — Encounter (HOSPITAL_COMMUNITY): Payer: Self-pay | Admitting: *Deleted

## 2014-03-27 ENCOUNTER — Emergency Department (HOSPITAL_COMMUNITY): Payer: Medicaid Other

## 2014-03-27 ENCOUNTER — Emergency Department (HOSPITAL_COMMUNITY)
Admission: EM | Admit: 2014-03-27 | Discharge: 2014-03-27 | Disposition: A | Discharge status: Home / Self Care | Payer: Medicaid Other | Attending: Emergency Medicine | Admitting: Emergency Medicine

## 2014-03-27 DIAGNOSIS — J45909 Unspecified asthma, uncomplicated: Secondary | ICD-10-CM | POA: Diagnosis not present

## 2014-03-27 DIAGNOSIS — Z79899 Other long term (current) drug therapy: Secondary | ICD-10-CM | POA: Diagnosis not present

## 2014-03-27 DIAGNOSIS — Z3202 Encounter for pregnancy test, result negative: Secondary | ICD-10-CM | POA: Diagnosis not present

## 2014-03-27 DIAGNOSIS — R109 Unspecified abdominal pain: Secondary | ICD-10-CM

## 2014-03-27 DIAGNOSIS — K8020 Calculus of gallbladder without cholecystitis without obstruction: Secondary | ICD-10-CM | POA: Diagnosis not present

## 2014-03-27 DIAGNOSIS — Z720 Tobacco use: Secondary | ICD-10-CM | POA: Insufficient documentation

## 2014-03-27 DIAGNOSIS — K8050 Calculus of bile duct without cholangitis or cholecystitis without obstruction: Secondary | ICD-10-CM

## 2014-03-27 HISTORY — DX: Calculus of gallbladder without cholecystitis without obstruction: K80.20

## 2014-03-27 LAB — COMPREHENSIVE METABOLIC PANEL
ALBUMIN: 3.9 g/dL (ref 3.5–5.2)
ALT: 12 U/L (ref 0–35)
AST: 14 U/L (ref 0–37)
Alkaline Phosphatase: 57 U/L (ref 39–117)
Anion gap: 13 (ref 5–15)
BUN: 13 mg/dL (ref 6–23)
CO2: 22 meq/L (ref 19–32)
CREATININE: 0.71 mg/dL (ref 0.50–1.10)
Calcium: 9.3 mg/dL (ref 8.4–10.5)
Chloride: 105 mEq/L (ref 96–112)
GFR calc Af Amer: >90 mL/min (ref >90)
GFR calc non Af Amer: >90 mL/min (ref >90)
Glucose, Bld: 95 mg/dL (ref 70–99)
Potassium: 4.5 mEq/L (ref 3.7–5.3)
SODIUM: 140 meq/L (ref 137–147)
Total Bilirubin: 0.2 mg/dL — ABNORMAL LOW (ref 0.3–1.2)
Total Protein: 7.5 g/dL (ref 6.0–8.3)

## 2014-03-27 LAB — LIPASE, BLOOD: LIPASE: 15 U/L (ref 11–59)

## 2014-03-27 LAB — CBC WITH DIFFERENTIAL/PLATELET
BASOS ABS: 0.1 10*3/uL (ref 0.0–0.1)
BASOS PCT: 1 % (ref 0–1)
EOS ABS: 0.1 10*3/uL (ref 0.0–0.7)
EOS PCT: 1 % (ref 0–5)
HCT: 47.4 % — ABNORMAL HIGH (ref 36.0–46.0)
Hemoglobin: 16.4 g/dL — ABNORMAL HIGH (ref 12.0–15.0)
LYMPHS PCT: 19 % (ref 12–46)
Lymphs Abs: 1.7 10*3/uL (ref 0.7–4.0)
MCH: 30 pg (ref 26.0–34.0)
MCHC: 34.6 g/dL (ref 30.0–36.0)
MCV: 86.8 fL (ref 78.0–100.0)
Monocytes Absolute: 0.4 10*3/uL (ref 0.1–1.0)
Monocytes Relative: 5 % (ref 3–12)
Neutro Abs: 6.5 10*3/uL (ref 1.7–7.7)
Neutrophils Relative %: 74 % (ref 43–77)
PLATELETS: 228 10*3/uL (ref 150–400)
RBC: 5.46 MIL/uL — ABNORMAL HIGH (ref 3.87–5.11)
RDW: 12.9 % (ref 11.5–15.5)
WBC: 8.7 10*3/uL (ref 4.0–10.5)

## 2014-03-27 LAB — URINALYSIS, ROUTINE W REFLEX MICROSCOPIC
Bilirubin Urine: NEGATIVE
Glucose, UA: NEGATIVE mg/dL
KETONES UR: NEGATIVE mg/dL
Nitrite: NEGATIVE
PROTEIN: NEGATIVE mg/dL
Specific Gravity, Urine: 1.015 (ref 1.005–1.030)
UROBILINOGEN UA: 0.2 mg/dL (ref 0.0–1.0)
pH: 5 (ref 5.0–8.0)

## 2014-03-27 LAB — URINE MICROSCOPIC-ADD ON

## 2014-03-27 LAB — PREGNANCY, URINE: Preg Test, Ur: NEGATIVE

## 2014-03-27 MED ORDER — SODIUM CHLORIDE 0.9 % IV BOLUS (SEPSIS)
1000.0000 mL | Freq: Once | INTRAVENOUS | Status: AC
  Administered 2014-03-27: 1000 mL via INTRAVENOUS

## 2014-03-27 MED ORDER — ONDANSETRON 4 MG PO TBDP: 4.0000 mg | ORAL_TABLET | Freq: Three times a day (TID) | ORAL | Status: DC | PRN

## 2014-03-27 MED ORDER — OXYCODONE-ACETAMINOPHEN 5-325 MG PO TABS: 2.0000 | ORAL_TABLET | ORAL | Status: DC | PRN

## 2014-03-27 MED ORDER — MORPHINE SULFATE 4 MG/ML IJ SOLN
4.0000 mg | Freq: Once | INTRAMUSCULAR | Status: AC
  Administered 2014-03-27: 4 mg via INTRAVENOUS
  Filled 2014-03-27: qty 1

## 2014-03-27 MED ORDER — ONDANSETRON HCL 4 MG/2ML IJ SOLN
4.0000 mg | Freq: Once | INTRAMUSCULAR | Status: AC
  Administered 2014-03-27: 4 mg via INTRAVENOUS
  Filled 2014-03-27: qty 2

## 2014-03-27 MED ORDER — MORPHINE SULFATE 4 MG/ML IJ SOLN: 4.0000 mg | Freq: Once | INTRAMUSCULAR | Status: DC

## 2014-03-27 MED ORDER — HYDROMORPHONE HCL 1 MG/ML IJ SOLN
1.0000 mg | Freq: Once | INTRAMUSCULAR | Status: AC
  Administered 2014-03-27: 1 mg via INTRAVENOUS
  Filled 2014-03-27: qty 1

## 2014-03-27 NOTE — ED Provider Notes (Signed)
CSN: [id number]     Arrival date & time 03/27/14  1230 History   First MD Initiated Contact with Patient 03/27/14 1307     Chief Complaint  Patient presents with  . Abdominal Pain     (Consider location/radiation/quality/duration/timing/severity/associated sxs/prior Treatment) HPI Comments: Pt comes in today for right upper quadrant abdominal pain that started yesterday after eating wings. Pt states that she has had nausea with vomiting, fever or diarrhea. Pt states that she had problems with stones during the pregnancy and this feels similar. Pt hasn't taken anything for pain. She states that she had an attack a couple of months ago and was seen a morehead.  The history is provided by the patient. No language interpreter was used.    Past Medical History  Diagnosis Date  . Asthma   . Hx of constipation   . Nexplanon insertion 10/11/2013    Inserted left arm 10/11/13 remove 5/27 /18  . Gall stones    Past Surgical History  Procedure Laterality Date  . Cystoscopy w/ ureteral stent placement     Family History  Problem Relation Age of Onset  . Hypertension Mother   . Diabetes Mother   . Hyperlipidemia Mother   . Hypertension Brother   . Other Paternal Aunt     benign breast lump  . Diabetes Maternal Grandmother   . Heart disease Maternal Grandmother    History  Substance Use Topics  . Smoking status: Current Every Day Smoker -- 1.00 packs/day for 4 years    Types: Cigarettes  . Smokeless tobacco: Never Used  . Alcohol Use: Yes     Comment: occassional; not now   OB History    Gravida Para Term Preterm AB TAB SAB Ectopic Multiple Living   2 2 1 1      2      Review of Systems  All other systems reviewed and are negative.     Allergies  Vinegar and Sulfa antibiotics  Home Medications   Prior to Admission medications   Medication Sig Start Date End Date Taking? Authorizing Provider  acetaminophen (TYLENOL) 500 MG tablet Take 1,000 mg by mouth every 6 (six)  hours as needed for pain (For headache.).    Historical Provider, MD  albuterol (PROVENTIL HFA;VENTOLIN HFA) 108 (90 BASE) MCG/ACT inhaler Inhale 2 puffs into the lungs every 6 (six) hours as needed for wheezing. 02/22/13   Tilda BurrowJohn Ferguson V, MD  butalbital-acetaminophen-caffeine (FIORICET) [phone] MG per tablet Take 1-2 tablets by mouth every 6 (six) hours as needed for headache. 09/14/13   Jacklyn ShellFrances Cresenzo-Dishmon, CNM  etonogestrel (NEXPLANON) 68 MG IMPL implant Inject 1 each into the skin once.    Historical Provider, MD  ibuprofen (ADVIL,MOTRIN) 200 MG tablet Take 200 mg by mouth as needed.    Historical Provider, MD   BP 135/65 mmHg  Pulse 98  Temp(Src) 98.1 F (36.7 C) (Oral)  Resp 20  Ht 5' (1.524 m)  Wt 185 lb (83.915 kg)  BMI 36.13 kg/m2  SpO2 98%  Breastfeeding? No Physical Exam  Constitutional: She is oriented to person, place, and time. She appears well-developed and well-nourished.  HENT:  Head: Normocephalic and atraumatic.  Cardiovascular: Normal rate and regular rhythm.   Pulmonary/Chest: Effort normal and breath sounds normal.  Abdominal: Soft. Bowel sounds are normal. There is tenderness in the right upper quadrant.  Musculoskeletal: Normal range of motion.  Neurological: She is alert and oriented to person, place, and time. Coordination normal.  Skin: Skin is  warm and dry.  Nursing note and vitals reviewed.   ED Course  Procedures (including critical care time) Labs Review Labs Reviewed  URINALYSIS, ROUTINE W REFLEX MICROSCOPIC - Abnormal; Notable for the following:    Hgb urine dipstick TRACE (*)    Leukocytes, UA TRACE (*)    All other components within normal limits  CBC WITH DIFFERENTIAL - Abnormal; Notable for the following:    RBC 5.46 (*)    Hemoglobin 16.4 (*)    HCT 47.4 (*)    All other components within normal limits  COMPREHENSIVE METABOLIC PANEL - Abnormal; Notable for the following:    Total Bilirubin 0.2 (*)    All other components within  normal limits  URINE MICROSCOPIC-ADD ON - Abnormal; Notable for the following:    Squamous Epithelial / LPF MANY (*)    Bacteria, UA FEW (*)    All other components within normal limits  PREGNANCY, URINE  LIPASE, BLOOD    Imaging Review [facility] Abdomen Complete  03/27/2014   CLINICAL DATA:  One day history of abdominal pain, generalized  EXAM: ULTRASOUND ABDOMEN COMPLETE  COMPARISON:  CT abdomen and pelvis November 10, 2006  FINDINGS: Gallbladder: Within the gallbladder, there are echogenic foci which move and shadow consistent with gallstones. The largest gallstone measures 14 mm in length. There is mild gallbladder wall thickening. There is no appreciable pericholecystic fluid. No sonographic Murphy sign noted.  Common bile duct: Diameter: 8 mm. No mass or calculus is seen in the biliary ductal system.  Liver: No focal lesion identified. Within normal limits in parenchymal echogenicity. Liver is mildly prominent measuring 15.1 cm in length.  IVC: No abnormality visualized.  Pancreas: Visualized portion unremarkable. A portion of the tail of the pancreas is obscured by gas.  Spleen: Spleen is borderline prominent measuring 11.8 x 6.2 x 11.4 cm. Splenic volume is measured at 434 cubic cm. No focal splenic lesions are identified.  Right Kidney: Length: 12.0 cm. Echogenicity within normal limits. No mass or hydronephrosis visualized.  Left Kidney: Length: 12.7 cm. Echogenicity within normal limits. No mass or hydronephrosis visualized.  Abdominal aorta: No aneurysm visualized.  Other findings: No demonstrable ascites.  IMPRESSION: Gallstones with gallbladder wall thickening. Suspect acute cholecystitis. Common bile duct is prominent measuring 8 mm in diameter. No mass or calculus is seen in the biliary ductal system. Question recent calculus passage as an etiology for the biliary duct prominence. Note that there is no intrahepatic biliary duct dilatation.  Liver is mildly prominent but otherwise unremarkable in  appearance.  Pancreatic tail is obscured by gas. Remainder of pancreas appears normal.  Spleen mildly prominent but otherwise normal in appearance.   Electronically Signed   By: Bretta BangWilliam  Woodruff M.D.   On: 03/27/2014 14:12     EKG Interpretation None      MDM   Final diagnoses:  Abdominal pain  Biliary colic    Pt is comfortable at this time an is tolerating po. Pt is afebrile. Pt doesn't have wbc elevation or lfts. Discussed with Dr. Lovell SheehanJenkins with surgery and he states that he will see her in the office in 2 days. Pt okay with plan. Discussed return precaution with pt    Teressa LowerVrinda Jaquelynn Wanamaker, NP 03/27/14 1525  Lyanne CoKevin M Campos, MD 03/27/14 [phone]

## 2014-03-27 NOTE — Discharge Instructions (Signed)
You can follow up on Thursday as discussed. If symptoms worsen and you develop fever or vomiting then return to the er  Biliary Colic  Biliary colic is a steady or irregular pain in the upper abdomen. It is usually under the right side of the rib cage. It happens when gallstones interfere with the normal flow of bile from the gallbladder. Bile is a liquid that helps to digest fats. Bile is made in the liver and stored in the gallbladder. When you eat a meal, bile passes from the gallbladder through the cystic duct and the common bile duct into the small intestine. There, it mixes with partially digested food. If a gallstone blocks either of these ducts, the normal flow of bile is blocked. The muscle cells in the bile duct contract forcefully to try to move the stone. This causes the pain of biliary colic.  SYMPTOMS   A person with biliary colic usually complains of pain in the upper abdomen. This pain can be:  In the center of the upper abdomen just below the breastbone.  In the upper-right part of the abdomen, near the gallbladder and liver.  Spread back toward the right shoulder blade.  Nausea and vomiting.  The pain usually occurs after eating.  Biliary colic is usually triggered by the digestive system's demand for bile. The demand for bile is high after fatty meals. Symptoms can also occur when a person who has been fasting suddenly eats a very large meal. Most episodes of biliary colic pass after 1 to 5 hours. After the most intense pain passes, your abdomen may continue to ache mildly for about 24 hours. DIAGNOSIS  After you describe your symptoms, your caregiver will perform a physical exam. He or she will pay attention to the upper right portion of your belly (abdomen). This is the area of your liver and gallbladder. An ultrasound will help your caregiver look for gallstones. Specialized scans of the gallbladder may also be done. Blood tests may be done, especially if you have fever or if  your pain persists. PREVENTION  Biliary colic can be prevented by controlling the risk factors for gallstones. Some of these risk factors, such as heredity, increasing age, and pregnancy are a normal part of life. Obesity and a high-fat diet are risk factors you can change through a healthy lifestyle. Women going through menopause who take hormone replacement therapy (estrogen) are also more likely to develop biliary colic. TREATMENT   Pain medication may be prescribed.  You may be encouraged to eat a fat-free diet.  If the first episode of biliary colic is severe, or episodes of colic keep retuning, surgery to remove the gallbladder (cholecystectomy) is usually recommended. This procedure can be done through small incisions using an instrument called a laparoscope. The procedure often requires a brief stay in the hospital. Some people can leave the hospital the same day. It is the most widely used treatment in people troubled by painful gallstones. It is effective and safe, with no complications in more than 90% of cases.  If surgery cannot be done, medication that dissolves gallstones may be used. This medication is expensive and can take months or years to work. Only small stones will dissolve.  Rarely, medication to dissolve gallstones is combined with a procedure called shock-wave lithotripsy. This procedure uses carefully aimed shock waves to break up gallstones. In many people treated with this procedure, gallstones form again within a few years. PROGNOSIS  If gallstones block your cystic duct or  common bile duct, you are at risk for repeated episodes of biliary colic. There is also a 25% chance that you will develop a gallbladder infection(acute cholecystitis), or some other complication of gallstones within 10 to 20 years. If you have surgery, schedule it at a time that is convenient for you and at a time when you are not sick. HOME CARE INSTRUCTIONS   Drink plenty of clear  fluids.  Avoid fatty, greasy or fried foods, or any foods that make your pain worse.  Take medications as directed. SEEK MEDICAL CARE IF:   You develop a fever over 100.5 F (38.1 C).  Your pain gets worse over time.  You develop nausea that prevents you from eating and drinking.  You develop vomiting. SEEK IMMEDIATE MEDICAL CARE IF:   You have continuous or severe belly (abdominal) pain which is not relieved with medications.  You develop nausea and vomiting which is not relieved with medications.  You have symptoms of biliary colic and you suddenly develop a fever and shaking chills. This may signal cholecystitis. Call your caregiver immediately.  You develop a yellow color to your skin or the white part of your eyes (jaundice). Document Released: 10/05/2005 Document Revised: 07/27/2011 Document Reviewed: 12/15/2007 [facility] Patient Information 2015 [employer], [place]. This information is not intended to replace advice given to you by your health care provider. Make sure you discuss any questions you have with your health care provider.

## 2014-03-27 NOTE — ED Notes (Signed)
abd pain since yesterday, Nausea, no vomiting,  No diarrhea.  Hx of gall stones.

## 2014-03-30 NOTE — H&P (Signed)
  NTS SOAP Note  Vital Signs:  Vitals as of: 03/29/2014: Systolic 136: Diastolic 76: Heart Rate 77: Temp 97.37F: Height 545ft 0in: Weight 191Lbs 0 Ounces: BMI 37.3  BMI : 37.3 kg/m2  Subjective: This 28 year old female presents for of right upper quadrant abdominal pain.  Has been present intermittently for many months.  Made worse with fatty food.  Pain radiates around to right flank. + nausea.  No fever,  chills,  jaundice.  Review of Symptoms:  Constitutional:fatigue headache Eyes:unremarkable   Nose/Mouth/Throat:unremarkable Cardiovascular:  unremarkable Respiratory:wheezing Gastrointestinabdominal pain, nausea Genitourinary:frequency back pain Skin:unremarkable Hematolgic/Lymphatic:unremarkable   Allergic/Immunologic:unremarkable   Past Medical History:  Reviewed  Past Medical History  Surgical History: tonsils Medical Problems: none Allergies: sulfa Medications: zofran,  percocet   Social History:Reviewed  Social History  Preferred Language: English Race:  White Ethnicity: Not Hispanic / Latino Age: [age] year Marital Status:  M Alcohol: unknown   Smoking Status: Current every day smoker reviewed on 03/29/2014 Started Date:  Packs per day: 1.00 Functional Status reviewed on 03/29/2014 ------------------------------------------------ Bathing: Normal Cooking: Normal Dressing: Normal Driving: Normal Eating: Normal Managing Meds: Normal Oral Care: Normal Shopping: Normal Toileting: Normal Transferring: Normal Walking: Normal Cognitive Status reviewed on 03/29/2014 ------------------------------------------------ Attention: Normal Decision Making: Normal Language: Normal Memory: Normal Motor: Normal Perception: Normal Problem Solving: Normal Visual and Spatial: Normal   Family History:Reviewed  Family Health History Mother, Living; Hypertension (high blood pressure); Diabetes mellitus, unspecified type;  Father      Objective Information: General:Well appearing, well nourished in no distress. no scleral icterus Heart:RRR, no murmur or gallop.  Normal S1, S2.  No S3, S4.  Lungs:  CTA bilaterally, no wheezes, rhonchi, rales.  Breathing unlabored. Abdomen:Soft, tender in right upper quadrant to palpation,  ND, normal bowel sounds, no HSM, no masses.  No peritoneal signs. u/s of gallbladder:  cholelithiasis,  normal common bile duct Assessment:Biliary colic,  cholelithiasis  Diagnoses: 574.20  K80.20 Gallstone (Calculus of gallbladder without cholecystitis without obstruction)  Procedures: [id number] - OFFICE OUTPATIENT NEW 30 MINUTES    Plan:  Scheduled for laparoscopic cholecystectomy on 04/06/14.   Patient Education:Alternative treatments to surgery were discussed with patient (and family).  Risks and benefits  of procedure including bleeding,  infeciton,  hepatobiliary injury,  and the possibility of an open procedure were fully explained to the patient (and family) who gave informed consent. Patient/family questions were addressed.  Follow-up:Pending Surgery

## 2014-04-03 ENCOUNTER — Encounter (HOSPITAL_COMMUNITY): Payer: Self-pay | Admitting: *Deleted

## 2014-04-03 ENCOUNTER — Encounter (HOSPITAL_COMMUNITY): Admission: RE | Admit: 2014-04-03 | Payer: Medicaid Other | Source: Ambulatory Visit

## 2014-04-06 ENCOUNTER — Ambulatory Visit (HOSPITAL_COMMUNITY)
Admission: RE | Admit: 2014-04-06 | Discharge: 2014-04-06 | Disposition: A | Discharge status: Home / Self Care | Payer: Medicaid Other | Source: Ambulatory Visit | Attending: General Surgery | Admitting: General Surgery

## 2014-04-06 ENCOUNTER — Encounter (HOSPITAL_COMMUNITY)
Admission: RE | Disposition: A | Discharge status: Home / Self Care | Payer: Self-pay | Source: Ambulatory Visit | Attending: General Surgery

## 2014-04-06 ENCOUNTER — Ambulatory Visit (HOSPITAL_COMMUNITY): Payer: Medicaid Other | Admitting: Anesthesiology

## 2014-04-06 ENCOUNTER — Encounter (HOSPITAL_COMMUNITY): Payer: Self-pay | Admitting: *Deleted

## 2014-04-06 DIAGNOSIS — J45909 Unspecified asthma, uncomplicated: Secondary | ICD-10-CM | POA: Diagnosis not present

## 2014-04-06 DIAGNOSIS — F17210 Nicotine dependence, cigarettes, uncomplicated: Secondary | ICD-10-CM | POA: Diagnosis not present

## 2014-04-06 DIAGNOSIS — K8010 Calculus of gallbladder with chronic cholecystitis without obstruction: Secondary | ICD-10-CM | POA: Diagnosis present

## 2014-04-06 HISTORY — PX: CHOLECYSTECTOMY: SHX55

## 2014-04-06 SURGERY — LAPAROSCOPIC CHOLECYSTECTOMY
Anesthesia: General

## 2014-04-06 MED ORDER — FENTANYL CITRATE 0.05 MG/ML IJ SOLN
INTRAMUSCULAR | Status: AC
  Filled 2014-04-06: qty 2

## 2014-04-06 MED ORDER — POVIDONE-IODINE 10 % OINT PACKET
TOPICAL_OINTMENT | CUTANEOUS | Status: DC | PRN
  Administered 2014-04-06: 1 via TOPICAL

## 2014-04-06 MED ORDER — DEXAMETHASONE SODIUM PHOSPHATE 4 MG/ML IJ SOLN
INTRAMUSCULAR | Status: AC
  Filled 2014-04-06: qty 1

## 2014-04-06 MED ORDER — PROPOFOL 10 MG/ML IV EMUL
INTRAVENOUS | Status: AC
  Filled 2014-04-06: qty 20

## 2014-04-06 MED ORDER — MIDAZOLAM HCL 2 MG/2ML IJ SOLN
1.0000 mg | INTRAMUSCULAR | Status: DC | PRN
  Administered 2014-04-06: 2 mg via INTRAVENOUS

## 2014-04-06 MED ORDER — ONDANSETRON HCL 4 MG/2ML IJ SOLN
INTRAMUSCULAR | Status: AC
  Filled 2014-04-06: qty 2

## 2014-04-06 MED ORDER — POVIDONE-IODINE 10 % EX OINT
TOPICAL_OINTMENT | CUTANEOUS | Status: AC
  Filled 2014-04-06: qty 1

## 2014-04-06 MED ORDER — FENTANYL CITRATE 0.05 MG/ML IJ SOLN
INTRAMUSCULAR | Status: DC | PRN
  Administered 2014-04-06: 100 ug via INTRAVENOUS

## 2014-04-06 MED ORDER — MIDAZOLAM HCL 2 MG/2ML IJ SOLN
INTRAMUSCULAR | Status: AC
Start: 2014-04-06 — End: 2014-04-06
  Filled 2014-04-06: qty 2

## 2014-04-06 MED ORDER — ONDANSETRON HCL 4 MG/2ML IJ SOLN: 4.0000 mg | Freq: Once | INTRAMUSCULAR | Status: DC | PRN

## 2014-04-06 MED ORDER — HYDROMORPHONE HCL 1 MG/ML IJ SOLN
0.2500 mg | INTRAMUSCULAR | Status: DC | PRN
  Administered 2014-04-06 (×5): 0.25 mg via INTRAVENOUS

## 2014-04-06 MED ORDER — LACTATED RINGERS IV SOLN
INTRAVENOUS | Status: DC
  Administered 2014-04-06 (×3): via INTRAVENOUS

## 2014-04-06 MED ORDER — PROPOFOL 10 MG/ML IV BOLUS
INTRAVENOUS | Status: DC | PRN
  Administered 2014-04-06: 200 mg via INTRAVENOUS

## 2014-04-06 MED ORDER — CHLORHEXIDINE GLUCONATE 4 % EX LIQD: 1.0000 "application " | Freq: Once | CUTANEOUS | Status: DC

## 2014-04-06 MED ORDER — FENTANYL CITRATE 0.05 MG/ML IJ SOLN
25.0000 ug | INTRAMUSCULAR | Status: DC | PRN
  Administered 2014-04-06 (×4): 50 ug via INTRAVENOUS
  Filled 2014-04-06 (×2): qty 2

## 2014-04-06 MED ORDER — CIPROFLOXACIN IN D5W 200 MG/100ML IV SOLN
INTRAVENOUS | Status: AC
  Filled 2014-04-06: qty 200

## 2014-04-06 MED ORDER — OXYCODONE-ACETAMINOPHEN 5-325 MG PO TABS: 1.0000 | ORAL_TABLET | ORAL | Status: DC | PRN

## 2014-04-06 MED ORDER — SUCCINYLCHOLINE CHLORIDE 20 MG/ML IJ SOLN
INTRAMUSCULAR | Status: AC
Start: 2014-04-06 — End: 2014-04-06
  Filled 2014-04-06: qty 1

## 2014-04-06 MED ORDER — LIDOCAINE HCL (CARDIAC) 20 MG/ML IV SOLN
INTRAVENOUS | Status: DC | PRN
  Administered 2014-04-06: 50 mg via INTRAVENOUS

## 2014-04-06 MED ORDER — BUPIVACAINE HCL (PF) 0.5 % IJ SOLN
INTRAMUSCULAR | Status: AC
  Filled 2014-04-06: qty 30

## 2014-04-06 MED ORDER — CIPROFLOXACIN IN D5W 400 MG/200ML IV SOLN
400.0000 mg | INTRAVENOUS | Status: AC
  Administered 2014-04-06: 400 mg via INTRAVENOUS

## 2014-04-06 MED ORDER — KETOROLAC TROMETHAMINE 30 MG/ML IJ SOLN
30.0000 mg | Freq: Once | INTRAMUSCULAR | Status: AC
  Administered 2014-04-06: 30 mg via INTRAVENOUS
  Filled 2014-04-06: qty 1

## 2014-04-06 MED ORDER — LIDOCAINE HCL (PF) 1 % IJ SOLN
INTRAMUSCULAR | Status: AC
  Filled 2014-04-06: qty 5

## 2014-04-06 MED ORDER — SODIUM CHLORIDE 0.9 % IR SOLN
Status: DC | PRN
  Administered 2014-04-06: 1000 mL

## 2014-04-06 MED ORDER — SUCCINYLCHOLINE CHLORIDE 20 MG/ML IJ SOLN
INTRAMUSCULAR | Status: DC | PRN
  Administered 2014-04-06: 120 mg via INTRAVENOUS

## 2014-04-06 MED ORDER — HYDROMORPHONE HCL 1 MG/ML IJ SOLN
INTRAMUSCULAR | Status: AC
  Filled 2014-04-06: qty 1

## 2014-04-06 MED ORDER — ONDANSETRON HCL 4 MG/2ML IJ SOLN
4.0000 mg | Freq: Once | INTRAMUSCULAR | Status: AC
  Administered 2014-04-06: 4 mg via INTRAVENOUS

## 2014-04-06 MED ORDER — HEMOSTATIC AGENTS (NO CHARGE) OPTIME
TOPICAL | Status: DC | PRN
  Administered 2014-04-06: 1 via TOPICAL

## 2014-04-06 MED ORDER — BUPIVACAINE HCL (PF) 0.5 % IJ SOLN
INTRAMUSCULAR | Status: DC | PRN
  Administered 2014-04-06: 10 mL

## 2014-04-06 MED ORDER — DEXAMETHASONE SODIUM PHOSPHATE 4 MG/ML IJ SOLN
4.0000 mg | Freq: Once | INTRAMUSCULAR | Status: AC
Start: 2014-04-06 — End: 2014-04-06
  Administered 2014-04-06: 4 mg via INTRAVENOUS

## 2014-04-06 SURGICAL SUPPLY — 47 items
APPLIER CLIP LAPSCP 10X32 DD (CLIP) ×3 IMPLANT
BAG HAMPER (MISCELLANEOUS) ×3 IMPLANT
BAG SPEC RTRVL LRG 6X4 10 (ENDOMECHANICALS) ×1
BLADE 11 SAFETY STRL DISP (BLADE) ×1 IMPLANT
CHLORAPREP W/TINT 26ML (MISCELLANEOUS) ×3 IMPLANT
CLOTH BEACON ORANGE TIMEOUT ST (SAFETY) ×3 IMPLANT
COVER LIGHT HANDLE STERIS (MISCELLANEOUS) ×6 IMPLANT
DECANTER SPIKE VIAL GLASS SM (MISCELLANEOUS) ×3 IMPLANT
ELECT REM PT RETURN 9FT ADLT (ELECTROSURGICAL) ×3
ELECTRODE REM PT RTRN 9FT ADLT (ELECTROSURGICAL) ×1 IMPLANT
FILTER SMOKE EVAC LAPAROSHD (FILTER) ×3 IMPLANT
FORMALIN 10 PREFIL 120ML (MISCELLANEOUS) ×3 IMPLANT
GLOVE BIO SURGEON STRL SZ 6.5 (GLOVE) ×3 IMPLANT
GLOVE BIO SURGEONS STRL SZ 6.5 (GLOVE) ×3
GLOVE BIOGEL PI IND STRL 7.0 (GLOVE) IMPLANT
GLOVE BIOGEL PI IND STRL 7.5 (GLOVE) IMPLANT
GLOVE BIOGEL PI INDICATOR 7.0 (GLOVE) ×4
GLOVE BIOGEL PI INDICATOR 7.5 (GLOVE) ×2
GLOVE SURG SS PI 7.5 STRL IVOR (GLOVE) ×3 IMPLANT
GOWN STRL REUS W/ TWL XL LVL3 (GOWN DISPOSABLE) ×1 IMPLANT
GOWN STRL REUS W/TWL LRG LVL3 (GOWN DISPOSABLE) ×6 IMPLANT
GOWN STRL REUS W/TWL XL LVL3 (GOWN DISPOSABLE) ×3
HEMOSTAT SNOW SURGICEL 2X4 (HEMOSTASIS) ×3 IMPLANT
INST SET LAPROSCOPIC AP (KITS) ×3 IMPLANT
IV NS IRRIG 3000ML ARTHROMATIC (IV SOLUTION) IMPLANT
KIT ROOM TURNOVER APOR (KITS) ×3 IMPLANT
MANIFOLD NEPTUNE II (INSTRUMENTS) ×3 IMPLANT
NDL INSUFFLATION 14GA 120MM (NEEDLE) ×1 IMPLANT
NEEDLE INSUFFLATION 14GA 120MM (NEEDLE) ×3 IMPLANT
NS IRRIG 1000ML POUR BTL (IV SOLUTION) ×3 IMPLANT
PACK LAP CHOLE LZT030E (CUSTOM PROCEDURE TRAY) ×3 IMPLANT
PAD ARMBOARD 7.5X6 YLW CONV (MISCELLANEOUS) ×3 IMPLANT
POUCH SPECIMEN RETRIEVAL 10MM (ENDOMECHANICALS) ×3 IMPLANT
SET BASIN LINEN APH (SET/KITS/TRAYS/PACK) ×3 IMPLANT
SET TUBE IRRIG SUCTION NO TIP (IRRIGATION / IRRIGATOR) IMPLANT
SLEEVE ENDOPATH XCEL 5M (ENDOMECHANICALS) ×3 IMPLANT
SPONGE GAUZE 2X2 8PLY STER LF (GAUZE/BANDAGES/DRESSINGS) ×1
SPONGE GAUZE 2X2 8PLY STRL LF (GAUZE/BANDAGES/DRESSINGS) ×5 IMPLANT
STAPLER VISISTAT (STAPLE) ×3 IMPLANT
SUT VICRYL 0 UR6 27IN ABS (SUTURE) ×3 IMPLANT
TAPE CLOTH SURG 4X10 WHT LF (GAUZE/BANDAGES/DRESSINGS) ×2 IMPLANT
TROCAR ENDO BLADELESS 11MM (ENDOMECHANICALS) ×3 IMPLANT
TROCAR XCEL NON-BLD 5MMX100MML (ENDOMECHANICALS) ×3 IMPLANT
TROCAR XCEL UNIV SLVE 11M 100M (ENDOMECHANICALS) ×3 IMPLANT
TUBING INSUFFLATION (TUBING) ×3 IMPLANT
WARMER LAPAROSCOPE (MISCELLANEOUS) ×3 IMPLANT
YANKAUER SUCT 12FT TUBE ARGYLE (SUCTIONS) ×3 IMPLANT

## 2014-04-06 NOTE — Anesthesia Preprocedure Evaluation (Addendum)
Anesthesia Evaluation  Patient identified by MRN, date of birth, ID band Patient awake    Reviewed: Allergy & Precautions, H&P , NPO status , Patient's Chart, lab work & pertinent test results  Airway Mallampati: II  TM Distance: >3 FB     Dental  (+) Teeth Intact   Pulmonary asthma (inhaler prn) , Current Smoker,  breath sounds clear to auscultation        Cardiovascular negative cardio ROS  Rhythm:Regular Rate:Normal     Neuro/Psych    GI/Hepatic Biliary colic     Endo/Other    Renal/GU      Musculoskeletal   Abdominal   Peds  Hematology   Anesthesia Other Findings   Reproductive/Obstetrics                            Anesthesia Physical Anesthesia Plan  ASA: II  Anesthesia Plan: General   Post-op Pain Management:    Induction: Intravenous  Airway Management Planned:   Additional Equipment:   Intra-op Plan:   Post-operative Plan: Extubation in OR  Informed Consent: I have reviewed the patients History and Physical, chart, labs and discussed the procedure including the risks, benefits and alternatives for the proposed anesthesia with the patient or authorized representative who has indicated his/her understanding and acceptance.     Plan Discussed with:   Anesthesia Plan Comments: (Inhaler to be used in preop)        Anesthesia Quick Evaluation

## 2014-04-06 NOTE — Transfer of Care (Signed)
Immediate Anesthesia Transfer of Care Note  Patient: Patricia Williamson  Procedure(s) Performed: Procedure(s): LAPAROSCOPIC CHOLECYSTECTOMY (N/A)  Patient Location: PACU  Anesthesia Type:General  Level of Consciousness: awake  Airway & Oxygen Therapy: Patient Spontanous Breathing and Patient connected to face mask oxygen  Post-op Assessment: Report given to PACU RN and Post -op Vital signs reviewed and stable  Post vital signs: Reviewed and stable  Complications: No apparent anesthesia complications

## 2014-04-06 NOTE — Anesthesia Postprocedure Evaluation (Signed)
  Anesthesia Post-op Note  Patient: Patricia Williamson  Procedure(s) Performed: Procedure(s): LAPAROSCOPIC CHOLECYSTECTOMY (N/A)  Patient Location: PACU  Anesthesia Type:General  Level of Consciousness: awake  Airway and Oxygen Therapy: Patient Spontanous Breathing and Patient connected to face mask oxygen  Post-op Pain: none  Post-op Assessment: Post-op Vital signs reviewed, Patient's Cardiovascular Status Stable and Respiratory Function Stable  Post-op Vital Signs: Reviewed and stable  Last Vitals:  Filed Vitals:   04/06/14 1037  BP: 122/71  Pulse: 73  Temp: 36.7 C  Resp: 24    Complications: No apparent anesthesia complications

## 2014-04-06 NOTE — Discharge Instructions (Signed)

## 2014-04-06 NOTE — Interval H&P Note (Signed)
History and Physical Interval Note:  04/06/2014 10:41 AM  Patricia Williamson  has presented today for surgery, with the diagnosis of cholelithiasis  The various methods of treatment have been discussed with the patient and family. After consideration of risks, benefits and other options for treatment, the patient has consented to  Procedure(s): LAPAROSCOPIC CHOLECYSTECTOMY (N/A) as a surgical intervention .  The patient's history has been reviewed, patient examined, no change in status, stable for surgery.  I have reviewed the patient's chart and labs.  Questions were answered to the patient's satisfaction.     Franky MachoJENKINS,Haya Hemler A

## 2014-04-06 NOTE — Anesthesia Procedure Notes (Signed)
Procedure Name: Intubation Date/Time: 04/06/2014 10:59 AM Performed by: Caren MacadamARTER, Saran Laviolette W Pre-anesthesia Checklist: Patient identified, Emergency Drugs available, Suction available and Patient being monitored Patient Re-evaluated:Patient Re-evaluated prior to inductionOxygen Delivery Method: Circle System Utilized Preoxygenation: Pre-oxygenation with 100% oxygen Intubation Type: IV induction Ventilation: Mask ventilation without difficulty Laryngoscope Size: Miller and 2 Grade View: Grade I Tube type: Oral Tube size: 7.0 mm Number of attempts: 1 Airway Equipment and Method: stylet and oral airway Placement Confirmation: ETT inserted through vocal cords under direct vision,  positive ETCO2 and breath sounds checked- equal and bilateral Secured at: 24 cm Tube secured with: Tape Dental Injury: Teeth and Oropharynx as per pre-operative assessment

## 2014-04-06 NOTE — Op Note (Signed)
Patient:  Patricia Williamson  DOB:  [birth year]  MRN:  [id number]   Preop Diagnosis:  Cholecystitis, cholelithiasis  Postop Diagnosis:  Same  Procedure:  Laparoscopic cholecystectomy  Surgeon:  Franky MachoMark Adabella Stanis, M.D.  Anes:  Gen. endotracheal  Indications:  Patient is a 28 year old white female presents with biliary colic secondary to cholelithiasis. The risks and benefits of the procedure including bleeding, infection, hepatobiliary injury, the possibility of an open procedure were fully explained to the patient, who gave informed consent.  Procedure note:  The patient was placed the supine position. After induction of general endotracheal anesthesia, the abdomen was prepped and draped using usual sterile technique with DuraPrep. Surgical site confirmation was performed.  A supraumbilical incision was made down to the fascia. A Veress needle was introduced into the abdominal cavity and confirmation of placement was done using the saline drop test. The abdomen was then insufflated to 16 mmHg pressure. An 11 mm trocar was introduced into the abdominal cavity under direct visualization without difficulty. The patient was placed in reverse Trendelenburg position and additional 11 mm trocar was placed the epigastric region and 5 mm trochars were placed the right upper quadrant and right flank regions. Liver was inspected and noted within normal limits. The gallbladder was retracted in a dynamic fashion and or to expose the triangle of Calot.  The cystic duct was first identified. Its juncture to the infundibulum was fully identified. Endoclips placed proximally distally on the cystic duct, and the cystic duct was divided. This was likewise done cystic artery. The gallbladder was freed away from the gallbladder fossa using Bovie electrocautery. The gallbladder was delivered to the epigastric trocar site using an Endo Catch bag. The gallbladder fossa was inspected no abnormal bleeding or bile leakage was noted.  Surgicel is placed the gallbladder fossa. All fluid and air were then evacuated from the abdominal cavity prior to removal of the trochars.  All wounds were irrigated with normal saline. All wounds were injected with 0.5% Sensorcaine. The supraumbilical fascia was reapproximated using 0 Vicryl interrupted suture. All skin incisions were closed using staples. Betadine ointment and dry sterile dressings were applied.  All tape and needle counts were correct at the end of the procedure. Patient was extubated in the operating room and transferred to PACU in stable condition.  Complications:  None  EBL:  Minimal  Specimen:  Gallbladder

## 2014-04-08 NOTE — Anesthesia Postprocedure Evaluation (Signed)
  Anesthesia Post-op Note  Patient: Patricia Williamson  Procedure(s) Performed: Procedure(s): LAPAROSCOPIC CHOLECYSTECTOMY (N/A)  Patient Location: PACU  Anesthesia Type:General  Level of Consciousness: awake, alert , oriented and patient cooperative  Airway and Oxygen Therapy: Patient Spontanous Breathing  Post-op Pain: none  Post-op Assessment: Post-op Vital signs reviewed, Patient's Cardiovascular Status Stable, Respiratory Function Stable, Patent Airway, Pain level controlled and No headache  Post-op Vital Signs: Reviewed and stable  Last Vitals:  Filed Vitals:   04/06/14 1321  BP: 114/61  Pulse: 82  Temp: 36.8 C  Resp:     Complications: No apparent anesthesia complications

## 2014-04-09 ENCOUNTER — Encounter (HOSPITAL_COMMUNITY): Payer: Self-pay | Admitting: General Surgery

## 2014-05-22 ENCOUNTER — Emergency Department (HOSPITAL_COMMUNITY)
Admission: EM | Admit: 2014-05-22 | Discharge: 2014-05-23 | Disposition: A | Discharge status: Home / Self Care | Payer: Medicaid Other | Attending: Emergency Medicine | Admitting: Emergency Medicine

## 2014-05-22 ENCOUNTER — Encounter (HOSPITAL_COMMUNITY): Payer: Self-pay

## 2014-05-22 DIAGNOSIS — Z720 Tobacco use: Secondary | ICD-10-CM | POA: Insufficient documentation

## 2014-05-22 DIAGNOSIS — J45909 Unspecified asthma, uncomplicated: Secondary | ICD-10-CM | POA: Insufficient documentation

## 2014-05-22 DIAGNOSIS — H11421 Conjunctival edema, right eye: Secondary | ICD-10-CM | POA: Insufficient documentation

## 2014-05-22 DIAGNOSIS — H109 Unspecified conjunctivitis: Secondary | ICD-10-CM

## 2014-05-22 DIAGNOSIS — Z8719 Personal history of other diseases of the digestive system: Secondary | ICD-10-CM | POA: Insufficient documentation

## 2014-05-22 NOTE — ED Provider Notes (Signed)
CSN: [id number]     Arrival date & time 05/22/14  2330 History  This chart was scribed for Janice Norrie, MD by Rayfield Citizen, ED Scribe. This patient was seen in room [facility] and the patient's care was started at 11:49 PM.    Chief Complaint  Patient presents with  . Eye Pain   The history is provided by the patient. No language interpreter was used.     HPI Comments: Patricia Williamson is an otherwise healthy 29 y.o. female who presents to the Emergency Department complaining of 4 days of right eye pain. She explains that her eye began itching 4 days PTA; she woke with her eye swollen shut. Her eye has continued to swell at night and drain clear fluid; she reports blurred vision in that eye. Her left eye is itching at present. She has been treating her symptoms for the past two days with "stye medicine" and "pink eye medicine"; this has worsened her symptoms. She does not wear contacts.   She reports that has had several exposures to pink eye recently. She states her-2 daughters had it 2 weeks ago, her nephew and her sister-in-law had it a couple of days ago.  She is a 1ppd smoker. She denies EtOH use. She does not work at present.   PCP none  Past Medical History  Diagnosis Date  . Asthma   . Hx of constipation   . Nexplanon insertion 10/11/2013    Inserted left arm 10/11/13 remove 5/27 /18  . Gall stones    Past Surgical History  Procedure Laterality Date  . Cystoscopy w/ ureteral stent placement    . Cholecystectomy N/A 04/06/2014    Procedure: LAPAROSCOPIC CHOLECYSTECTOMY;  Surgeon: Jamesetta So, MD;  Location: [facility];  Service: General;  Laterality: N/A;   Family History  Problem Relation Age of Onset  . Hypertension Mother   . Diabetes Mother   . Hyperlipidemia Mother   . Hypertension Brother   . Other Paternal Aunt     benign breast lump  . Diabetes Maternal Grandmother   . Heart disease Maternal Grandmother    History  Substance Use Topics  . Smoking status:  Current Every Day Smoker -- 1.00 packs/day for 4 years    Types: Cigarettes  . Smokeless tobacco: Never Used  . Alcohol Use: Yes     Comment: occassional; not now   Unemployed   OB History    Gravida Para Term Preterm AB TAB SAB Ectopic Multiple Living   2 2 1 1      2      Review of Systems  Eyes: Positive for discharge, redness, itching and visual disturbance.  All other systems reviewed and are negative.     Allergies  Vinegar and Sulfa antibiotics  Home Medications   none BP 123/81 mmHg  Pulse 83  Temp(Src) 98.3 F (36.8 C) (Oral)  Resp 16  Ht 5' (1.524 m)  Wt 185 lb (83.915 kg)  BMI 36.13 kg/m2  SpO2 100%  Vital signs normal   Physical Exam  Constitutional: She is oriented to person, place, and time. She appears well-developed and well-nourished.  Non-toxic appearance. She does not appear ill. No distress.  HENT:  Head: Normocephalic and atraumatic.  Right Ear: External ear normal.  Left Ear: External ear normal.  Nose: Nose normal. No mucosal edema or rhinorrhea.  Mouth/Throat: Mucous membranes are normal. No dental abscesses or uvula swelling.  Diffuse injection of right sclera, dry drainage on eyelids  Eyes: EOM are normal. Pupils are equal, round, and reactive to light. Right eye exhibits chemosis and discharge. Right eye exhibits no hordeolum. Left eye exhibits no chemosis, no discharge and no hordeolum. Right conjunctiva is injected. Left conjunctiva is not injected.  Neck: Normal range of motion and full passive range of motion without pain. Neck supple.  Pulmonary/Chest: Effort normal. No respiratory distress. She has no rhonchi. She exhibits no crepitus.  Abdominal: Normal appearance.  Musculoskeletal: Normal range of motion. She exhibits no edema or tenderness.  Moves all extremities well.   Neurological: She is alert and oriented to person, place, and time. She has normal strength. No cranial nerve deficit.  Skin: Skin is warm, dry and intact. No  rash noted. No erythema. No pallor.  Psychiatric: She has a normal mood and affect. Her speech is normal and behavior is normal. Her mood appears not anxious.  Nursing note and vitals reviewed.   ED Course  Procedures   Medications  ciprofloxacin (CILOXAN) 0.3 % ophthalmic solution 2 drop (2 drops Right Eye Given 05/23/14 0034)     DIAGNOSTIC STUDIES: Oxygen Saturation is 100% on RA, normal by my interpretation.    COORDINATION OF CARE: 11:56 PM Discussed treatment plan with pt at bedside and pt agreed to plan. Pt started on antibiotic drops in the ED.   Patient's mother called their home and found out patient had been taking erythromycin ointment.   Labs Review Labs Reviewed - No data to display  Imaging Review No results found.   EKG Interpretation None      MDM   Final diagnoses:  Conjunctivitis of right eye    Discharge Medication List as of 05/23/2014 12:14 AM    ciloxan   Plan discharge  Rolland Porter, MD, FACEP   I personally performed the services described in this documentation, which was scribed in my presence. The recorded information has been reviewed and considered.  Rolland Porter, MD, Abram Sander      Janice Norrie, MD 05/23/14 [phone]

## 2014-05-22 NOTE — ED Notes (Signed)
Pt c/o right eye pain, swelling, redness, and drainage x 4 days, taking her sisters med without relief.

## 2014-05-23 MED ORDER — CIPROFLOXACIN HCL 0.3 % OP SOLN
2.0000 [drp] | OPHTHALMIC | Status: DC
  Administered 2014-05-23: 2 [drp] via OPHTHALMIC
  Filled 2014-05-23: qty 2.5

## 2014-05-23 MED ORDER — CIPROFLOXACIN HCL 0.3 % OP SOLN
OPHTHALMIC | Status: AC
  Filled 2014-05-23: qty 2.5

## 2014-05-23 NOTE — Discharge Instructions (Signed)
Warm compresses or cold compresses to your eye for comfort. Use the eye drops every 2 hrs while awake the first 2 days then every 4 hrs while awake from day 3-7. If your eye isn't improving in the next 48 hours, call Dr Lita MainsHaines office to have him recheck your eye. Wash your hands frequently.    Eye Drops Use eye drops as directed. It may be easier to have someone help you put the drops in your eye. If you are alone, use the following instructions to help you.  Wash your hands before putting drops in your eyes.  Read the label and look at your medication. Check for any expiration date that may appear on the bottle or tube. Changes of color may be a warning that the medication is old or ineffective. This is especially true if the medication has become brown in color. If you have questions or concerns, call your caregiver. DROPS  Tilt your head back with the affected eye uppermost. Gently pull down on your lower lid. Do not pull up on the upper lid.  Look up. Place the dropper or bottle just over the edge of the lower lid near the white portion at the bottom of the eye. The goal is to have the drop go into the little sac formed by the lower lid and the bottom of the eye itself. Do not release the drop from a height of several inches over the eye. That will only serve to startle the person receiving the medicine when it lands and forces a blink.  Steady your hand in a comfortable manner. An example would be to hold the dropper or bottle between your thumb and index (pointing) finger. Lean your index finger against the brow.  Then, slowly and gently squeeze one drop of medication into your eye.  Once the medication has been applied, place your finger between the lower eyelid and the nose, pressing firmly against the nose for 5-10 seconds. This will slow the process of the eye drop entering the small canal that normally drains tears into the nose, and therefore increases the exposure of the medicine to  the eye for a few extra seconds. OINTMENTS  Look up. Place the tip of the tube just over the edge of the lower lid near the white portion at the bottom of the eye. The goal is to create a line of ointment along the inner surface of the eyelid in the little sac formed by the lower lid and the bottom of the eye itself.  Avoid touching the tube tip to your eyeball or eyelid. This avoids contamination of the tube or the medicine in the tube.  Once a line of medicine has been created, hold the upper lid up and look down before releasing the upper lid. This will force the ointment to spread over the surface of the eye.  Your vision will be very blurry for a few minutes after applying an ointment properly. This is normal and will clear as you continue to blink. For this reason, it is best to apply ointments just before going to sleep, or at a time when you can rest your eyes for 5-10 minutes after applying the medication. GENERAL  Store your medicine in a cool, dry place after each use.  If you need a second medication, wait at least two minutes. This helps the first medication to be taken up (absorbed) by the eye.  If you have been instructed to use both an eye drop  and an eye ointment, always apply the drop first and then the ointment 3-4 minutes afterward. Never put medications into the eye unless the label reads, "For Ophthalmic Use," "For Use In Eyes" or "Eye Drops." If you have questions, call your caregiver. Document Released: 08/10/2000 Document Revised: 09/18/2013 Document Reviewed: 10/16/2008 [facility] Patient Information 2015 [place], [place]. This information is not intended to replace advice given to you by your health care provider. Make sure you discuss any questions you have with your health care provider.  Conjunctivitis Conjunctivitis is commonly called "pink eye." Conjunctivitis can be caused by bacterial or viral infection, allergies, or injuries. There is usually redness of the  lining of the eye, itching, discomfort, and sometimes discharge. There may be deposits of matter along the eyelids. A viral infection usually causes a watery discharge, while a bacterial infection causes a yellowish, thick discharge. Pink eye is very contagious and spreads by direct contact. You may be given antibiotic eyedrops as part of your treatment. Before using your eye medicine, remove all drainage from the eye by washing gently with warm water and cotton balls. Continue to use the medication until you have awakened 2 mornings in a row without discharge from the eye. Do not rub your eye. This increases the irritation and helps spread infection. Use separate towels from other household members. Wash your hands with soap and water before and after touching your eyes. Use cold compresses to reduce pain and sunglasses to relieve irritation from light. Do not wear contact lenses or wear eye makeup until the infection is gone. SEEK MEDICAL CARE IF:   Your symptoms are not better after 3 days of treatment.  You have increased pain or trouble seeing.  The outer eyelids become very red or swollen. Document Released: 06/11/2004 Document Revised: 07/27/2011 Document Reviewed: 05/04/2005 [facility] Patient Information 2015 [place], [place]. This information is not intended to replace advice given to you by your health care provider. Make sure you discuss any questions you have with your health care provider.

## 2014-07-16 ENCOUNTER — Emergency Department (HOSPITAL_COMMUNITY)
Admission: EM | Admit: 2014-07-16 | Discharge: 2014-07-16 | Discharge status: Against Medical Advice | Payer: Medicaid Other | Attending: Emergency Medicine | Admitting: Emergency Medicine

## 2014-07-16 ENCOUNTER — Encounter (HOSPITAL_COMMUNITY): Payer: Self-pay | Admitting: *Deleted

## 2014-07-16 DIAGNOSIS — Z720 Tobacco use: Secondary | ICD-10-CM | POA: Insufficient documentation

## 2014-07-16 DIAGNOSIS — M549 Dorsalgia, unspecified: Secondary | ICD-10-CM | POA: Insufficient documentation

## 2014-07-16 DIAGNOSIS — J45909 Unspecified asthma, uncomplicated: Secondary | ICD-10-CM | POA: Insufficient documentation

## 2014-07-16 NOTE — ED Notes (Signed)
No answer

## 2014-07-16 NOTE — ED Notes (Signed)
Called for room. No answer in the waiting room. Will try again later.

## 2014-07-16 NOTE — ED Notes (Signed)
Pain low back for 5 days, Increased pain with movement.  No known injury

## 2014-09-25 ENCOUNTER — Encounter (HOSPITAL_COMMUNITY): Payer: Self-pay

## 2014-09-25 ENCOUNTER — Emergency Department (HOSPITAL_COMMUNITY)
Admission: EM | Admit: 2014-09-25 | Discharge: 2014-09-25 | Disposition: A | Discharge status: Home / Self Care | Payer: Medicaid Other | Attending: Emergency Medicine | Admitting: Emergency Medicine

## 2014-09-25 DIAGNOSIS — G5602 Carpal tunnel syndrome, left upper limb: Secondary | ICD-10-CM | POA: Insufficient documentation

## 2014-09-25 DIAGNOSIS — Z79899 Other long term (current) drug therapy: Secondary | ICD-10-CM | POA: Insufficient documentation

## 2014-09-25 DIAGNOSIS — Z8719 Personal history of other diseases of the digestive system: Secondary | ICD-10-CM | POA: Insufficient documentation

## 2014-09-25 DIAGNOSIS — J45909 Unspecified asthma, uncomplicated: Secondary | ICD-10-CM | POA: Insufficient documentation

## 2014-09-25 DIAGNOSIS — G5601 Carpal tunnel syndrome, right upper limb: Secondary | ICD-10-CM | POA: Insufficient documentation

## 2014-09-25 DIAGNOSIS — Z720 Tobacco use: Secondary | ICD-10-CM | POA: Insufficient documentation

## 2014-09-25 DIAGNOSIS — G5603 Carpal tunnel syndrome, bilateral upper limbs: Secondary | ICD-10-CM

## 2014-09-25 HISTORY — DX: Carpal tunnel syndrome, unspecified upper limb: G56.00

## 2014-09-25 MED ORDER — PREDNISONE 10 MG PO TABS: ORAL_TABLET | ORAL | Status: DC

## 2014-09-25 MED ORDER — TRAMADOL HCL 50 MG PO TABS: 50.0000 mg | ORAL_TABLET | Freq: Four times a day (QID) | ORAL | Status: DC | PRN

## 2014-09-25 NOTE — ED Notes (Signed)
bil hand pain for several days. Worse at night. C/o fingers being swollen. Denies any injury.

## 2014-09-25 NOTE — Discharge Instructions (Signed)
Carpal Tunnel Syndrome The carpal tunnel is a narrow area located on the palm side of your wrist. The tunnel is formed by the wrist bones and ligaments. Nerves, blood vessels, and tendons pass through the carpal tunnel. Repeated wrist motion or certain diseases may cause swelling within the tunnel. This swelling pinches the main nerve in the wrist (median nerve) and causes the painful hand and arm condition called carpal tunnel syndrome. CAUSES   Repeated wrist motions.  Wrist injuries.  Certain diseases like arthritis, diabetes, alcoholism, hyperthyroidism, and kidney failure.  Obesity.  Pregnancy. SYMPTOMS   A "pins and needles" feeling in your fingers or hand, especially in your thumb, index and middle fingers.  Tingling or numbness in your fingers or hand.  An aching feeling in your entire arm, especially when your wrist and elbow are bent for long periods of time.  Wrist pain that goes up your arm to your shoulder.  Pain that goes down into your palm or fingers.  A weak feeling in your hands. DIAGNOSIS  Your health care provider will take your history and perform a physical exam. An electromyography test may be needed. This test measures electrical signals sent out by your nerves into the muscles. The electrical signals are usually slowed by carpal tunnel syndrome. You may also need X-rays. TREATMENT  Carpal tunnel syndrome may clear up by itself. Your health care provider may recommend a wrist splint or medicine such as a nonsteroidal anti-inflammatory medicine. Cortisone injections may help. Sometimes, surgery may be needed to free the pinched nerve.  HOME CARE INSTRUCTIONS   Take all medicine as directed by your health care provider. Only take over-the-counter or prescription medicines for pain, discomfort, or fever as directed by your health care provider.  If you were given a splint to keep your wrist from bending, wear it as directed. It is important to wear the splint at  night. Wear the splint for as long as you have pain or numbness in your hand, arm, or wrist. This may take 1 to 2 months.  Rest your wrist from any activity that may be causing your pain. If your symptoms are work-related, you may need to talk to your employer about changing to a job that does not require using your wrist.  Put ice on your wrist after long periods of wrist activity.  Put ice in a plastic bag.  Place a towel between your skin and the bag.  Leave the ice on for 15-20 minutes, 03-04 times a day.  Keep all follow-up visits as directed by your health care provider. This includes any orthopedic referrals, physical therapy, and rehabilitation. Any delay in getting necessary care could result in a delay or failure of your condition to heal. SEEK IMMEDIATE MEDICAL CARE IF:   You have new, unexplained symptoms.  Your symptoms get worse and are not helped or controlled with medicines. MAKE SURE YOU:   Understand these instructions.  Will watch your condition.  Will get help right away if you are not doing well or get worse. Document Released: 05/01/2000 Document Revised: 09/18/2013 Document Reviewed: 03/20/2011 [facility] Patient Information 2015 [place], [place]. This information is not intended to replace advice given to you by your health care provider. Make sure you discuss any questions you have with your health care provider.   Wear the wrist splints at night in order to help relieve the pressure and swelling on your wrist joint while sleeping.  Call Dr. Hilda LiasKeeling for further evaluation if your symptoms  persist.

## 2014-09-27 NOTE — ED Provider Notes (Signed)
CSN: [id number]     Arrival date & time 09/25/14  1153 History   First MD Initiated Contact with Patient 09/25/14 1219     Chief Complaint  Patient presents with  . Hand Pain     (Consider location/radiation/quality/duration/timing/severity/associated sxs/prior Treatment) The history is provided by the patient.   Patricia Williamson is a 29 y.o. female presenting for assistance with her worsening carpal tunnel syndrome.  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  Now has developed increased pain in the bilateral wrists and hands, worse when she first wakes in the morning along with swelling in the fingers which improves with movement and elevation.  She describes all of her fingers becoming intermittently numb, but worsened in the thumb, index and long fingers.  She has used ice and elevation tylenol with no significant relief.  She has never seen a specialist for her carpal tunnel which was diagnosed several years ago.     Past Medical History  Diagnosis Date  . Asthma   . Hx of constipation   . Nexplanon insertion 10/11/2013    Inserted left arm 10/11/13 remove 5/27 /18  . Gall stones   . Carpal tunnel syndrome    Past Surgical History  Procedure Laterality Date  . Cystoscopy w/ ureteral stent placement    . Cholecystectomy N/A 04/06/2014    Procedure: LAPAROSCOPIC CHOLECYSTECTOMY;  Surgeon: Dalia HeadingMark A Jenkins, MD;  Location: [facility];  Service: General;  Laterality: N/A;   Family History  Problem Relation Age of Onset  . Hypertension Mother   . Diabetes Mother   . Hyperlipidemia Mother   . Hypertension Brother   . Other Paternal Aunt     benign breast lump  . Diabetes Maternal Grandmother   . Heart disease Maternal Grandmother    History  Substance Use Topics  . Smoking status: Current Every Day Smoker -- 1.00 packs/day for 4 years    Types: Cigarettes  . Smokeless tobacco: Never Used  . Alcohol  Use: Yes     Comment: occassional; not now   OB History    Gravida Para Term Preterm AB TAB SAB Ectopic Multiple Living   2 2 1 1      2      Review of Systems  Constitutional: Negative for fever.  Musculoskeletal: Positive for joint swelling and arthralgias. Negative for myalgias.  Neurological: Positive for numbness. Negative for weakness.      Allergies  Vinegar and Sulfa antibiotics  Home Medications   Prior to Admission medications   Medication Sig Start Date End Date Taking? Authorizing Provider  acetaminophen (TYLENOL) 500 MG tablet Take 1,000 mg by mouth every 6 (six) hours as needed for pain (For headache.).   Yes Historical Provider, MD  albuterol (PROVENTIL HFA;VENTOLIN HFA) 108 (90 BASE) MCG/ACT inhaler Inhale 2 puffs into the lungs every 6 (six) hours as needed for wheezing. 02/22/13  Yes Tilda BurrowJohn Ferguson V, MD  etonogestrel (NEXPLANON) 68 MG IMPL implant Inject 1 each into the skin once.   Yes Historical Provider, MD  ibuprofen (ADVIL,MOTRIN) 200 MG tablet Take 200 mg by mouth as needed.   Yes Historical Provider, MD  ondansetron (ZOFRAN ODT) 4 MG disintegrating tablet Take 1 tablet (4 mg total) by mouth every 8 (eight) hours as needed for nausea or vomiting. Patient not taking: Reported on 09/25/2014 03/27/14   Teressa LowerVrinda Pickering, NP  oxyCODONE-acetaminophen (PERCOCET/ROXICET) 5-325 MG per tablet Take 1-2  tablets by mouth every 4 (four) hours as needed for moderate pain or severe pain. Patient not taking: Reported on 09/25/2014 04/06/14   Franky MachoMark Jenkins Md, MD  predniSONE (DELTASONE) 10 MG tablet 6, 5, 4, 3, 2 then 1 tablet by mouth daily for 6 days total. 09/25/14   Burgess AmorJulie Felicitas Sine, PA-C  traMADol (ULTRAM) 50 MG tablet Take 1 tablet (50 mg total) by mouth every 6 (six) hours as needed. 09/25/14   Burgess AmorJulie Kenyon Eichelberger, PA-C   BP 125/70 mmHg  Pulse 85  Temp(Src) 98.6 F (37 C) (Oral)  Resp 18  Ht 5' (1.524 m)  Wt 194 lb (87.998 kg)  BMI 37.89 kg/m2  SpO2 98% Physical Exam   Constitutional: She appears well-developed and well-nourished.  HENT:  Head: Atraumatic.  Neck: Normal range of motion and full passive range of motion without pain. Neck supple. No spinous process tenderness and no muscular tenderness present.  Cardiovascular:  Pulses:      Radial pulses are 2+ on the right side, and 2+ on the left side.  Pulses equal bilaterally.  Less than 3 sec cap refill in fingertips.  Musculoskeletal: She exhibits tenderness. She exhibits no edema.  Bilateral positive Phalen's test.    Lymphadenopathy:       Right: No epitrochlear adenopathy present.       Left: No epitrochlear adenopathy present.  Neurological: She is alert. She has normal strength. She displays normal reflexes. No sensory deficit.  Reflex Scores:      Bicep reflexes are 2+ on the right side and 2+ on the left side. Equal grip strength.  Skin: Skin is warm and dry.  Psychiatric: She has a normal mood and affect.    ED Course  Procedures (including critical care time) Labs Review Labs Reviewed - No data to display  Imaging Review No results found.   EKG Interpretation None      MDM   Final diagnoses:  Bilateral carpal tunnel syndrome    Pt was placed in velcro wrist splints, instructed to wear predominantly at night to prevent nocturnal wrist flexion. Ice,  Elevation.  Prednisone taper, tramadol.  Referral to Dr. Hilda LiasKeeling for further eval/management of this condition.  The patient appears reasonably screened and/or stabilized for discharge and I doubt any other medical condition or other [facility] requiring further screening, evaluation, or treatment in the ED at this time prior to discharge.    Burgess AmorJulie Zamarian Scarano, PA-C 09/27/14 1434  Eber HongBrian Miller, MD 09/27/14 2200

## 2014-10-01 ENCOUNTER — Telehealth: Payer: Self-pay | Admitting: Women's Health

## 2014-10-01 NOTE — Telephone Encounter (Signed)
Pt states has carpal tunnel in both wrists and was put on Ultram and Prednisone and since has been having headaches, moodiness and a slight brownish color when wipes after urinating, she finished medications today.  Pt has an appointment scheduled here this Wednesday 5/18.  Advised pt to use tylenol or Ibuprofen for headaches and see if over the next couple of days other symptoms resolve now that she has finished the medications, otherwise symptoms can be addressed at OV on Wednesday.  Pt verbalized understanding.

## 2014-10-03 ENCOUNTER — Ambulatory Visit: Payer: Medicaid Other | Admitting: Women's Health

## 2014-12-27 IMAGING — US US ABDOMEN LIMITED
1 series · 14 of 25 positions shown · non-contrast
Comparison: None.

CLINICAL DATA: Epigastric and back pain with vomiting.  Pregnant.

EXAM:
US ABDOMEN LIMITED - RIGHT UPPER QUADRANT

[Series 1: us abdomen limited ruq/ascites · 14 of 27 slices shown]
[im 1/27]
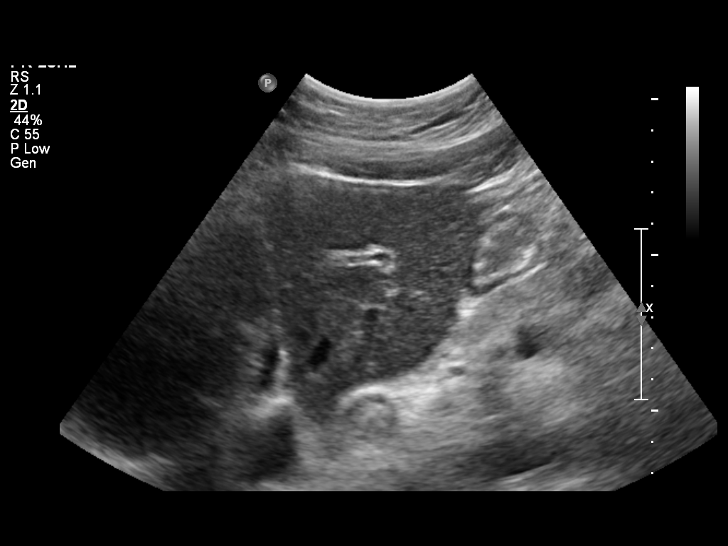
[im 3/27]
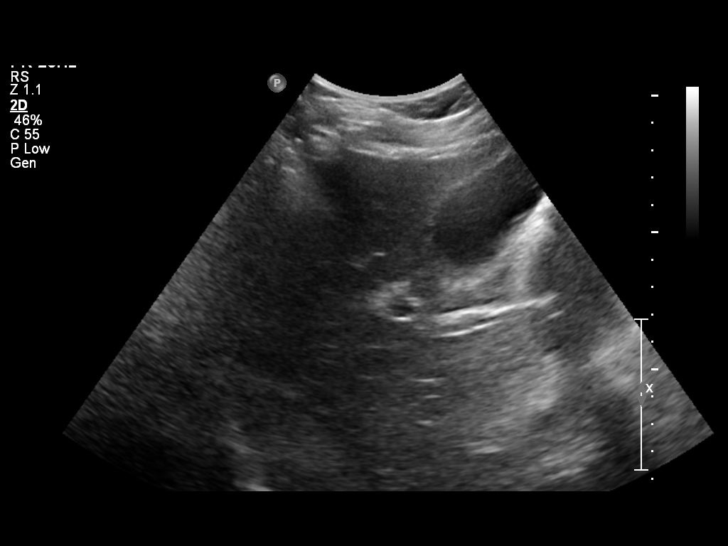
[im 5/27]
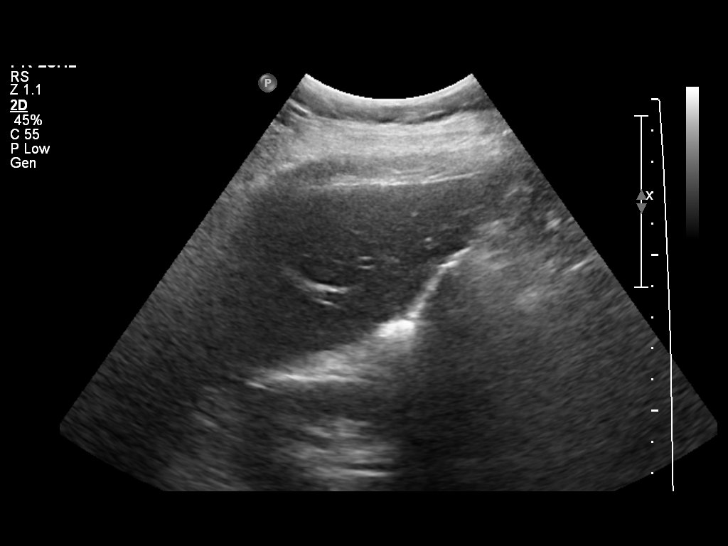
[im 7/27]
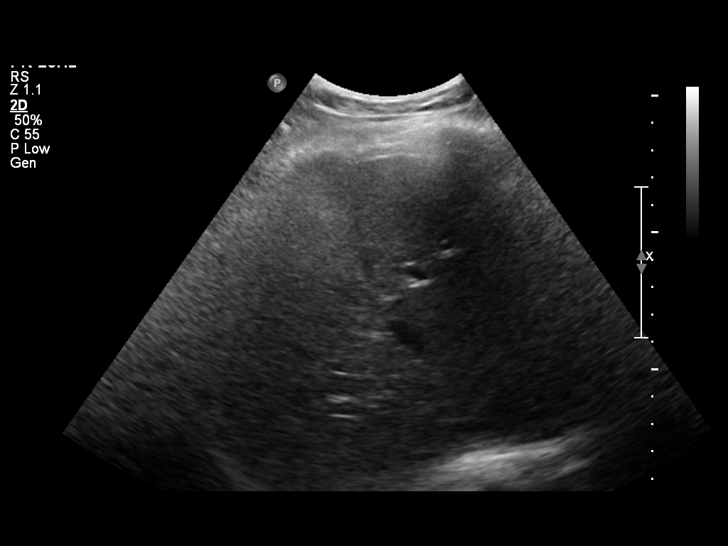
[im 9/27]
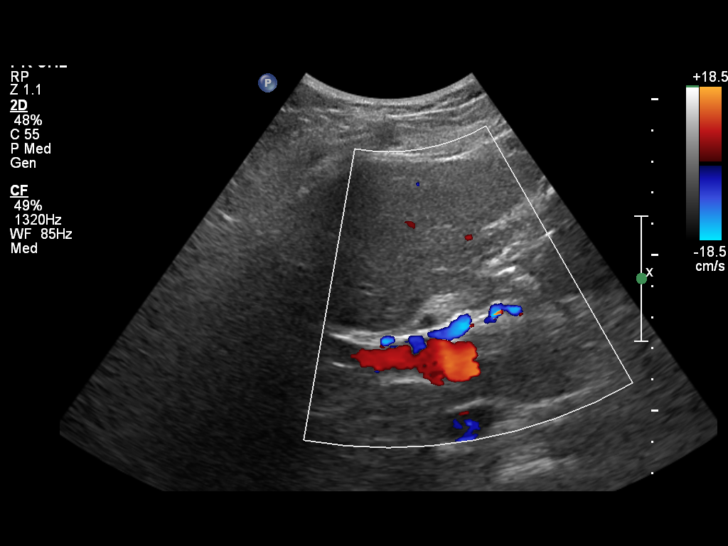
[im 10/27]
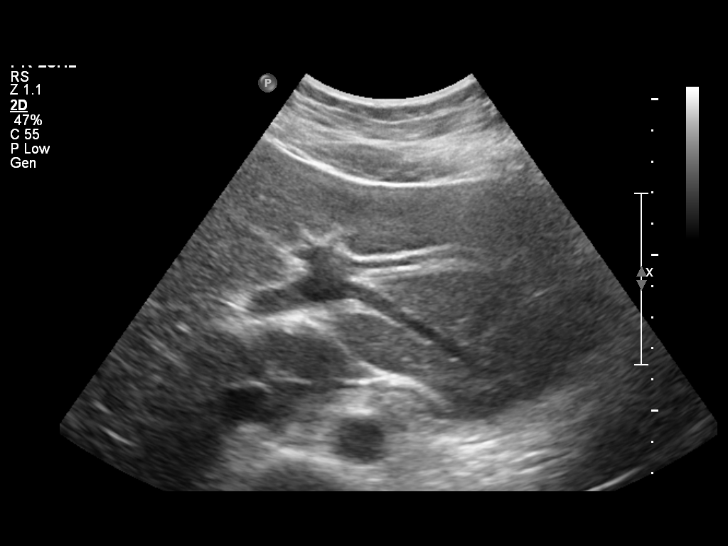
[im 12/27]
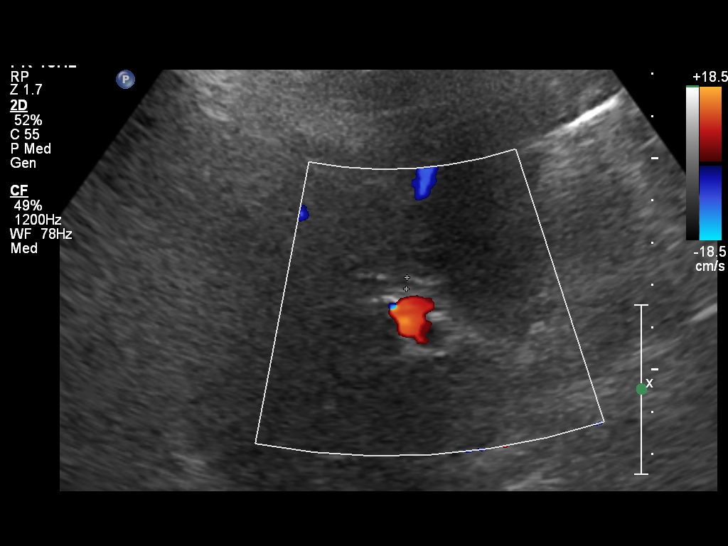
[im 15/27]
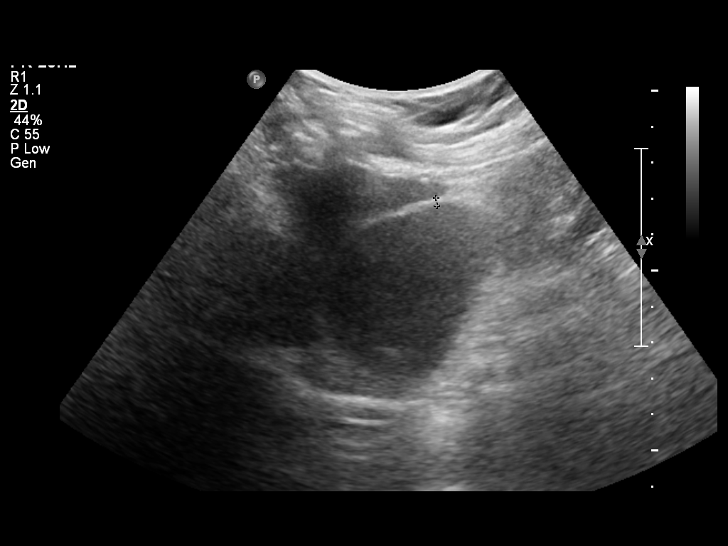
[im 17/27]
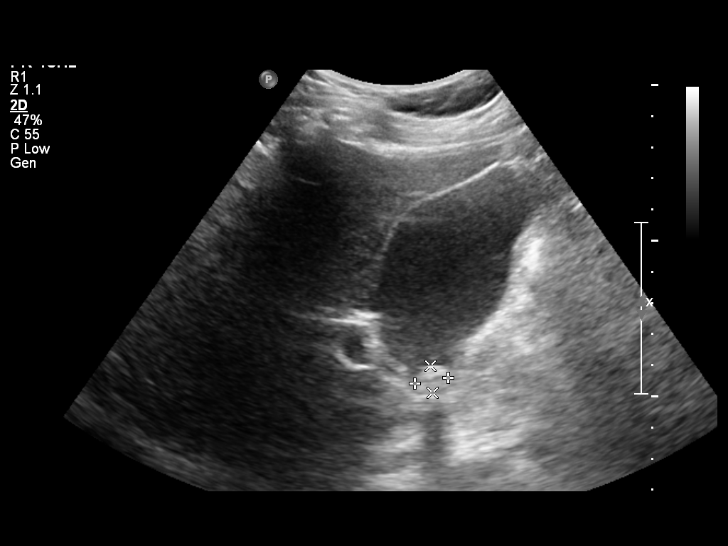
[im 18/27]
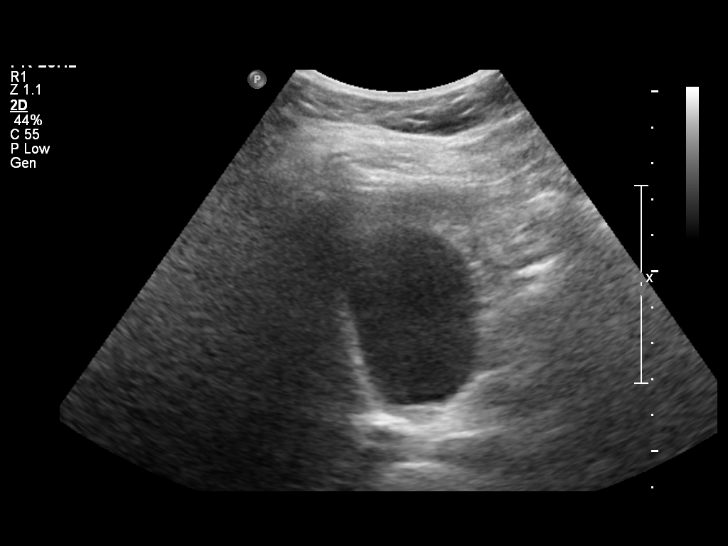
[im 20/27]
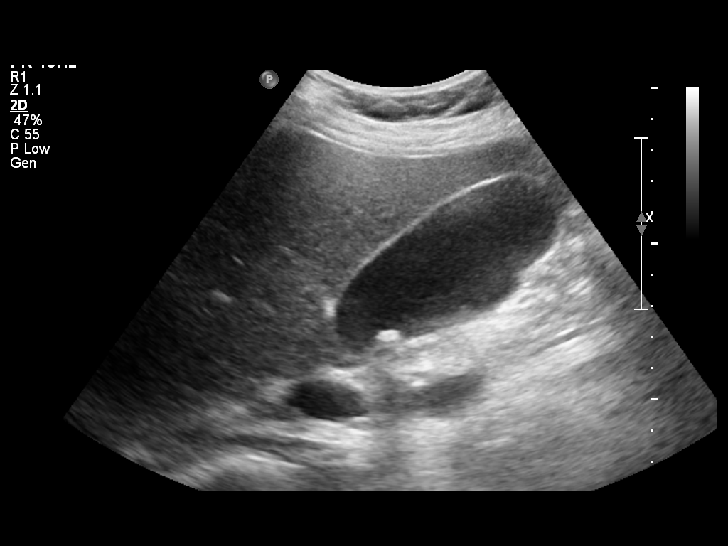
[im 22/27]
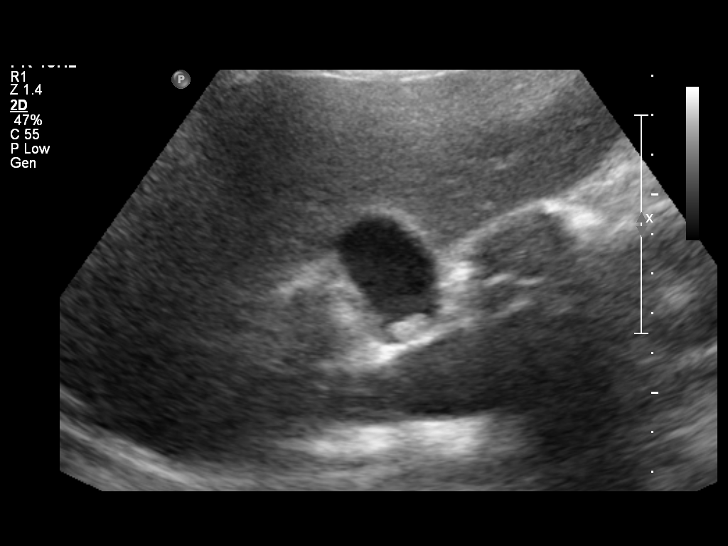
[im 24/27]
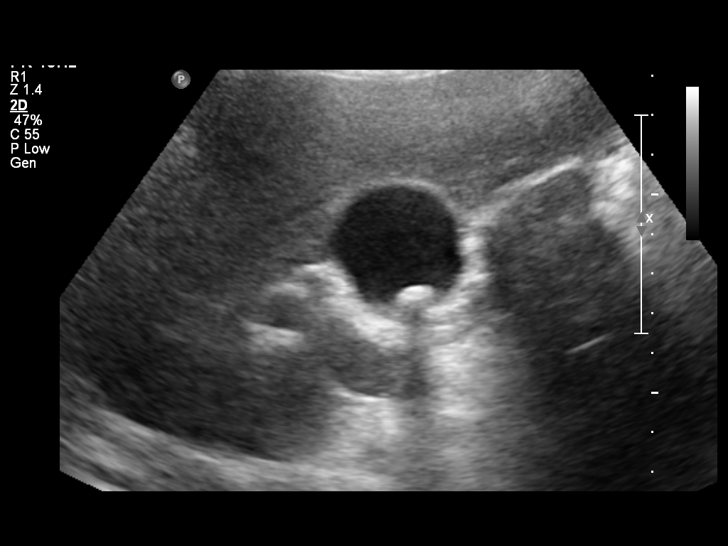
[im 27/27]
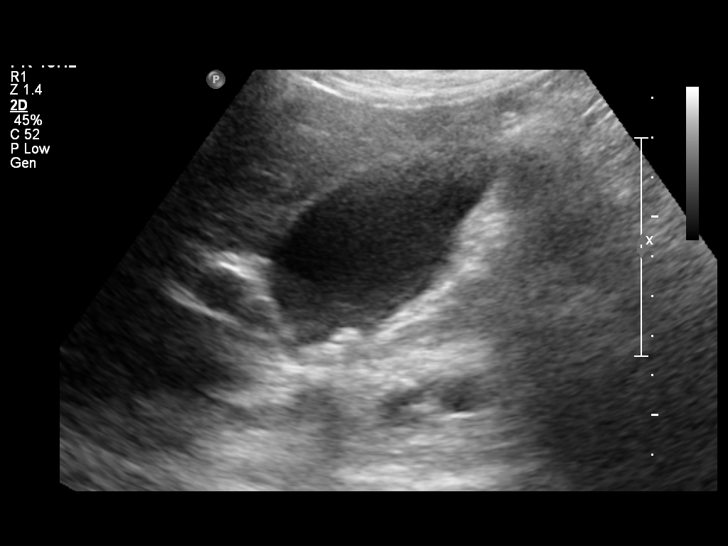

[14 of 25 positions shown; findings below may reference images not displayed]

FINDINGS: Gallbladder

Several non mobile calculi are noted within the gallbladder lumen.
There is no gallbladder wall thickening or sonographic Murphy sign.

Common bile duct

Diameter: 2.7 mm.  No intraductal calculi demonstrated.

Liver:

No focal lesion identified. Within normal limits in parenchymal
echogenicity.
IMPRESSION: Cholelithiasis without evidence of cholecystitis or biliary
dilatation.

## 2015-07-08 ENCOUNTER — Emergency Department (HOSPITAL_COMMUNITY): Payer: Self-pay

## 2015-07-08 ENCOUNTER — Encounter (HOSPITAL_COMMUNITY): Payer: Self-pay | Admitting: *Deleted

## 2015-07-08 ENCOUNTER — Emergency Department (HOSPITAL_COMMUNITY)
Admission: EM | Admit: 2015-07-08 | Discharge: 2015-07-08 | Disposition: A | Discharge status: Home / Self Care | Payer: Self-pay | Attending: Emergency Medicine | Admitting: Emergency Medicine

## 2015-07-08 DIAGNOSIS — Z8719 Personal history of other diseases of the digestive system: Secondary | ICD-10-CM | POA: Insufficient documentation

## 2015-07-08 DIAGNOSIS — J45909 Unspecified asthma, uncomplicated: Secondary | ICD-10-CM | POA: Insufficient documentation

## 2015-07-08 DIAGNOSIS — Z79899 Other long term (current) drug therapy: Secondary | ICD-10-CM | POA: Insufficient documentation

## 2015-07-08 DIAGNOSIS — R110 Nausea: Secondary | ICD-10-CM | POA: Insufficient documentation

## 2015-07-08 DIAGNOSIS — F17210 Nicotine dependence, cigarettes, uncomplicated: Secondary | ICD-10-CM | POA: Insufficient documentation

## 2015-07-08 DIAGNOSIS — J322 Chronic ethmoidal sinusitis: Secondary | ICD-10-CM | POA: Insufficient documentation

## 2015-07-08 DIAGNOSIS — Z3202 Encounter for pregnancy test, result negative: Secondary | ICD-10-CM | POA: Insufficient documentation

## 2015-07-08 DIAGNOSIS — H53149 Visual discomfort, unspecified: Secondary | ICD-10-CM | POA: Insufficient documentation

## 2015-07-08 LAB — BASIC METABOLIC PANEL
ANION GAP: 7 (ref 5–15)
BUN: 9 mg/dL (ref 6–20)
CALCIUM: 9 mg/dL (ref 8.9–10.3)
CO2: 25 mmol/L (ref 22–32)
Chloride: 109 mmol/L (ref 101–111)
Creatinine, Ser: 0.61 mg/dL (ref 0.44–1.00)
GFR calc Af Amer: >60 mL/min (ref >60)
Glucose, Bld: 99 mg/dL (ref 65–99)
POTASSIUM: 3.8 mmol/L (ref 3.5–5.1)
SODIUM: 141 mmol/L (ref 135–145)

## 2015-07-08 LAB — POC URINE PREG, ED: Preg Test, Ur: NEGATIVE

## 2015-07-08 LAB — CBC WITH DIFFERENTIAL/PLATELET
BASOS ABS: 0.1 10*3/uL (ref 0.0–0.1)
Basophils Relative: 1 %
EOS ABS: 0.1 10*3/uL (ref 0.0–0.7)
Eosinophils Relative: 1 %
HCT: 45.8 % (ref 36.0–46.0)
Hemoglobin: 15.9 g/dL — ABNORMAL HIGH (ref 12.0–15.0)
Lymphocytes Relative: 20 %
Lymphs Abs: 1.8 10*3/uL (ref 0.7–4.0)
MCH: 30.5 pg (ref 26.0–34.0)
MCHC: 34.7 g/dL (ref 30.0–36.0)
MCV: 87.9 fL (ref 78.0–100.0)
Monocytes Absolute: 0.7 10*3/uL (ref 0.1–1.0)
Monocytes Relative: 8 %
Neutro Abs: 6.2 10*3/uL (ref 1.7–7.7)
Neutrophils Relative %: 70 %
PLATELETS: 204 10*3/uL (ref 150–400)
RBC: 5.21 MIL/uL — ABNORMAL HIGH (ref 3.87–5.11)
RDW: 13 % (ref 11.5–15.5)
WBC: 8.7 10*3/uL (ref 4.0–10.5)

## 2015-07-08 MED ORDER — DEXAMETHASONE SODIUM PHOSPHATE 10 MG/ML IJ SOLN
10.0000 mg | Freq: Once | INTRAMUSCULAR | Status: AC
  Administered 2015-07-08: 10 mg via INTRAVENOUS
  Filled 2015-07-08: qty 1

## 2015-07-08 MED ORDER — DIPHENHYDRAMINE HCL 50 MG/ML IJ SOLN
25.0000 mg | Freq: Once | INTRAMUSCULAR | Status: AC
  Administered 2015-07-08: 25 mg via INTRAVENOUS
  Filled 2015-07-08: qty 1

## 2015-07-08 MED ORDER — SODIUM CHLORIDE 0.9 % IV BOLUS (SEPSIS)
1000.0000 mL | Freq: Once | INTRAVENOUS | Status: AC
Start: 2015-07-08 — End: 2015-07-08
  Administered 2015-07-08: 1000 mL via INTRAVENOUS

## 2015-07-08 MED ORDER — PROCHLORPERAZINE EDISYLATE 5 MG/ML IJ SOLN
10.0000 mg | Freq: Once | INTRAMUSCULAR | Status: AC
  Administered 2015-07-08: 10 mg via INTRAVENOUS
  Filled 2015-07-08: qty 2

## 2015-07-08 MED ORDER — HYDROCODONE-ACETAMINOPHEN 5-325 MG PO TABS: 1.0000 | ORAL_TABLET | ORAL | Status: DC | PRN

## 2015-07-08 MED ORDER — AMOXICILLIN-POT CLAVULANATE 875-125 MG PO TABS
1.0000 | ORAL_TABLET | Freq: Two times a day (BID) | ORAL | Status: DC
Start: 2015-07-08 — End: 2015-07-25

## 2015-07-08 MED ORDER — PSEUDOEPHEDRINE HCL 60 MG PO TABS: 60.0000 mg | ORAL_TABLET | Freq: Four times a day (QID) | ORAL | Status: DC | PRN

## 2015-07-08 NOTE — ED Provider Notes (Signed)
CSN: [id number]     Arrival date & time 07/08/15  0809 History   First MD Initiated Contact with Patient 07/08/15 [phone]     Chief Complaint  Patient presents with  . Headache     (Consider location/radiation/quality/duration/timing/severity/associated sxs/prior Treatment) The history is provided by the patient.   Patricia Williamson is a 30 y.o. female with a history of migraine headaches but with a near constant headache along with nasal congestion and drainage with constant forehead and right temple pressure for the past 2-3 weeks.  Over the past 3 days her headache has worsened and since yesterday she has developed nausea without emesis, dizziness and scotoma described as "seeing spots" which she has not had with previous migraines. She became increasingly dizzy this am. She denies fevers, chills, focal weakness.  She has taken excedrin migraine without relief of symptoms.  She endorses photo and phonophobia.     Past Medical History  Diagnosis Date  . Asthma   . Hx of constipation   . Nexplanon insertion 10/11/2013    Inserted left arm 10/11/13 remove 5/27 /18  . Gall stones   . Carpal tunnel syndrome    Past Surgical History  Procedure Laterality Date  . Cystoscopy w/ ureteral stent placement    . Cholecystectomy N/A 04/06/2014    Procedure: LAPAROSCOPIC CHOLECYSTECTOMY;  Surgeon: Dalia Heading, MD;  Location: [facility];  Service: General;  Laterality: N/A;   Family History  Problem Relation Age of Onset  . Hypertension Mother   . Diabetes Mother   . Hyperlipidemia Mother   . Hypertension Brother   . Other Paternal Aunt     benign breast lump  . Diabetes Maternal Grandmother   . Heart disease Maternal Grandmother    Social History  Substance Use Topics  . Smoking status: Current Every Day Smoker -- 1.00 packs/day for 4 years    Types: Cigarettes  . Smokeless tobacco: Never Used  . Alcohol Use: No   OB History    Gravida Para Term Preterm AB TAB SAB Ectopic Multiple  Living   Review of Systems  Constitutional: Negative for fever and chills.  HENT: Positive for congestion and sinus pressure. Negative for sore throat.   Eyes: Positive for photophobia.  Respiratory: Negative for chest tightness and shortness of breath.   Cardiovascular: Negative for chest pain.  Gastrointestinal: Positive for nausea. Negative for vomiting and abdominal pain.  Genitourinary: Negative.   Musculoskeletal: Negative for joint swelling, arthralgias and neck pain.  Skin: Negative.  Negative for rash and wound.  Neurological: Positive for dizziness and headaches. Negative for weakness, light-headedness and numbness.  Psychiatric/Behavioral: Negative.       Allergies  Vinegar and Sulfa antibiotics  Home Medications   Prior to Admission medications   Medication Sig Start Date End Date Taking? Authorizing Provider  acetaminophen (TYLENOL) 500 MG tablet Take 1,000 mg by mouth every 6 (six) hours as needed for pain (For headache.).   Yes Historical Provider, MD  albuterol (PROVENTIL HFA;VENTOLIN HFA) 108 (90 BASE) MCG/ACT inhaler Inhale 2 puffs into the lungs every 6 (six) hours as needed for wheezing. 02/22/13  Yes Tilda Burrow, MD  ibuprofen (ADVIL,MOTRIN) 200 MG tablet Take 200 mg by mouth as needed.   Yes Historical Provider, MD  amoxicillin-clavulanate (AUGMENTIN) 875-125 MG tablet Take 1 tablet by mouth every 12 (twelve) hours. 07/08/15   Burgess Amor, PA-C  etonogestrel (NEXPLANON)  68 MG IMPL implant Inject 1 each into the skin once.    Historical Provider, MD  HYDROcodone-acetaminophen (NORCO/VICODIN) 5-325 MG tablet Take 1 tablet by mouth every 4 (four) hours as needed. 07/08/15   Burgess Amor, PA-C  ondansetron (ZOFRAN ODT) 4 MG disintegrating tablet Take 1 tablet (4 mg total) by mouth every 8 (eight) hours as needed for nausea or vomiting. Patient not taking: Reported on 09/25/2014 03/27/14   Teressa Lower, NP  oxyCODONE-acetaminophen  (PERCOCET/ROXICET) 5-325 MG per tablet Take 1-2 tablets by mouth every 4 (four) hours as needed for moderate pain or severe pain. Patient not taking: Reported on 09/25/2014 04/06/14   Franky Macho, MD  predniSONE (DELTASONE) 10 MG tablet 6, 5, 4, 3, 2 then 1 tablet by mouth daily for 6 days total. 09/25/14   Burgess Amor, PA-C  pseudoephedrine (SUDAFED) 60 MG tablet Take 1 tablet (60 mg total) by mouth every 6 (six) hours as needed for congestion. 07/08/15   Burgess Amor, PA-C  traMADol (ULTRAM) 50 MG tablet Take 1 tablet (50 mg total) by mouth every 6 (six) hours as needed. 09/25/14   Burgess Amor, PA-C   BP 103/56 mmHg  Pulse 79  Temp(Src) 97.8 F (36.6 C) (Oral)  Resp 16  Ht 5' (1.524 m)  Wt 87.998 kg  BMI 37.89 kg/m2  SpO2 95% Physical Exam  Constitutional: She is oriented to person, place, and time. She appears well-developed and well-nourished.  Uncomfortable appearing  HENT:  Head: Normocephalic and atraumatic.  Right Ear: Tympanic membrane and ear canal normal.  Left Ear: Tympanic membrane and ear canal normal.  Nose: No mucosal edema. Right sinus exhibits frontal sinus tenderness.  Mouth/Throat: Oropharynx is clear and moist.  No ttp at right temple.  Eyes: EOM are normal. Pupils are equal, round, and reactive to light.  Neck: Normal range of motion. Neck supple.  Cardiovascular: Normal rate and normal heart sounds.   Pulmonary/Chest: Effort normal.  Abdominal: Soft. There is no tenderness.  Musculoskeletal: Normal range of motion.  Lymphadenopathy:    She has no cervical adenopathy.  Neurological: She is alert and oriented to person, place, and time. She has normal strength. No sensory deficit. Gait normal. GCS eye subscore is 4. GCS verbal subscore is 5. GCS motor subscore is 6.  Normal heel-shin, normal rapid alternating movements. Cranial nerves III-XII intact.  No pronator drift.  Skin: Skin is warm and dry. No rash noted.  Psychiatric: She has a normal mood and affect. Her  speech is normal and behavior is normal. Thought content normal. Cognition and memory are normal.  Nursing note and vitals reviewed.   ED Course  Procedures (including critical care time) Labs Review Labs Reviewed  CBC WITH DIFFERENTIAL/PLATELET - Abnormal; Notable for the following:    RBC 5.21 (*)    Hemoglobin 15.9 (*)    All other components within normal limits  BASIC METABOLIC PANEL  POC URINE PREG, ED    Imaging Review Ct Head Wo Contrast  07/08/2015  CLINICAL DATA:  Progressive headache for 4 days. Dizziness. Lightheadedness. Visual disturbance. EXAM: CT HEAD WITHOUT CONTRAST TECHNIQUE: Contiguous axial images were obtained from the base of the skull through the vertex without intravenous contrast. COMPARISON:  None. FINDINGS: No evidence of intracranial hemorrhage, brain edema, or other signs of acute infarction. No evidence of intracranial mass lesion or mass effect. No abnormal extraaxial fluid collections identified. Ventricles are normal in size. No skull abnormality identified. IMPRESSION: Negative noncontrast head CT. Electronically Signed  By: Myles Rosenthal M.D.   On: 07/08/2015 09:53   I have personally reviewed and evaluated these images and lab results as part of my medical decision-making.  Review of CT shows partial opacification of ethmoid sinus.   EKG Interpretation None      MDM   Final diagnoses:  Sinusitis chronic, ethmoidal    Pt was given IV fluids and migraine cocktail including compazine, decadron and benadryl with complete relief of headache. She ambulated without dizziness or other complaint. Prescribed hydrocodone prn for return of headache, placed on augmentin, sudafed. Advised f/u with pcp or return here for any new or worsened sx.  CT imaging negative for any other acute intracranial process.  No neuro deficits on exam.   Burgess Amor, PA-C 07/09/15 1610  Glynn Octave, MD 07/09/15 [phone]

## 2015-07-08 NOTE — ED Notes (Signed)
Provider at bedside

## 2015-07-08 NOTE — ED Notes (Signed)
Pt reports no pain- she is resting, eyes closed with even unlabored respirations

## 2015-07-08 NOTE — Discharge Instructions (Signed)
Sinusitis, Adult °Sinusitis is redness, soreness, and inflammation of the paranasal sinuses. Paranasal sinuses are air pockets within the bones of your face. They are located beneath your eyes, in the middle of your forehead, and above your eyes. In healthy paranasal sinuses, mucus is able to drain out, and air is able to circulate through them by way of your nose. However, when your paranasal sinuses are inflamed, mucus and air can become trapped. This can allow bacteria and other germs to grow and cause infection. °Sinusitis can develop quickly and last only a short time (acute) or continue over a long period (chronic). Sinusitis that lasts for more than 12 weeks is considered chronic. °CAUSES °Causes of sinusitis include: °· Allergies. °· Structural abnormalities, such as displacement of the cartilage that separates your nostrils (deviated septum), which can decrease the air flow through your nose and sinuses and affect sinus drainage. °· Functional abnormalities, such as when the small hairs (cilia) that line your sinuses and help remove mucus do not work properly or are not present. °SIGNS AND SYMPTOMS °Symptoms of acute and chronic sinusitis are the same. The primary symptoms are pain and pressure around the affected sinuses. Other symptoms include: °· Upper toothache. °· Earache. °· Headache. °· Bad breath. °· Decreased sense of smell and taste. °· A cough, which worsens when you are lying flat. °· Fatigue. °· Fever. °· Thick drainage from your nose, which often is green and may contain pus (purulent). °· Swelling and warmth over the affected sinuses. °DIAGNOSIS °Your health care provider will perform a physical exam. During your exam, your health care provider may perform any of the following to help determine if you have acute sinusitis or chronic sinusitis: °· Look in your nose for signs of abnormal growths in your nostrils (nasal polyps). °· Tap over the affected sinus to check for signs of  infection. °· View the inside of your sinuses using an imaging device that has a light attached (endoscope). °If your health care provider suspects that you have chronic sinusitis, one or more of the following tests may be recommended: °· Allergy tests. °· Nasal culture. A sample of mucus is taken from your nose, sent to a lab, and screened for bacteria. °· Nasal cytology. A sample of mucus is taken from your nose and examined by your health care provider to determine if your sinusitis is related to an allergy. °TREATMENT °Most cases of acute sinusitis are related to a viral infection and will resolve on their own within 10 days. Sometimes, medicines are prescribed to help relieve symptoms of both acute and chronic sinusitis. These may include pain medicines, decongestants, nasal steroid sprays, or saline sprays. °However, for sinusitis related to a bacterial infection, your health care provider will prescribe antibiotic medicines. These are medicines that will help kill the bacteria causing the infection. °Rarely, sinusitis is caused by a fungal infection. In these cases, your health care provider will prescribe antifungal medicine. °For some cases of chronic sinusitis, surgery is needed. Generally, these are cases in which sinusitis recurs more than 3 times per year, despite other treatments. °HOME CARE INSTRUCTIONS °· Drink plenty of water. Water helps thin the mucus so your sinuses can drain more easily. °· Use a humidifier. °· Inhale steam 3-4 times a day (for example, sit in the bathroom with the shower running). °· Apply a warm, moist washcloth to your face 3-4 times a day, or as directed by your health care provider. °· Use saline nasal sprays to help   moisten and clean your sinuses. °· Take medicines only as directed by your health care provider. °· If you were prescribed either an antibiotic or antifungal medicine, finish it all even if you start to feel better. °SEEK IMMEDIATE MEDICAL CARE IF: °· You have  increasing pain or severe headaches. °· You have nausea, vomiting, or drowsiness. °· You have swelling around your face. °· You have vision problems. °· You have a stiff neck. °· You have difficulty breathing. °  °This information is not intended to replace advice given to you by your health care provider. Make sure you discuss any questions you have with your health care provider. °  °Document Released: 05/04/2005 Document Revised: 05/25/2014 Document Reviewed: 05/19/2011 °Elsevier Interactive Patient Education ©2016 [employer] ° ° °You may take the hydrocodone prescribed for pain relief.  This will make you drowsy - do not drive within 4 hours of taking this medication. ° °

## 2015-07-08 NOTE — ED Notes (Signed)
Pt states productive cough (clear-green in color) and runny nose since 1st of Feb. States headache began yesterday but got worse at 0400 also states dizziness, lightheaded and seeing spots at the time. Pt states headache continues. Hx of migraines. NAD at this time.

## 2015-07-08 NOTE — ED Notes (Signed)
Report recieved 

## 2015-07-22 ENCOUNTER — Telehealth: Payer: Self-pay | Admitting: *Deleted

## 2015-07-22 NOTE — Telephone Encounter (Signed)
Pt states she has had the nexplanon x 2 years now having breakthrough bleeding, soreness in arm were nexplanon is at. Pt also c/o pain with intercourse. Pt informed to keep her appt on Thursday, July 25, 2015 and can discuss concerns with Shawna ClampKimberly Booker, CNM at that time. Pt verbalized understanding.

## 2015-07-25 ENCOUNTER — Other Ambulatory Visit (HOSPITAL_COMMUNITY)
Admission: RE | Admit: 2015-07-25 | Discharge: 2015-07-25 | Disposition: A | Discharge status: Home / Self Care | Payer: MEDICAID | Source: Ambulatory Visit | Attending: Obstetrics and Gynecology | Admitting: Obstetrics and Gynecology

## 2015-07-25 ENCOUNTER — Encounter: Payer: Self-pay | Admitting: Women's Health

## 2015-07-25 ENCOUNTER — Ambulatory Visit (INDEPENDENT_AMBULATORY_CARE_PROVIDER_SITE_OTHER): Payer: Medicaid Other | Admitting: Women's Health

## 2015-07-25 VITALS — BP 132/60 | HR 96 | Ht 61.0 in | Wt 199.0 lb

## 2015-07-25 DIAGNOSIS — Z308 Encounter for other contraceptive management: Secondary | ICD-10-CM

## 2015-07-25 DIAGNOSIS — Z01419 Encounter for gynecological examination (general) (routine) without abnormal findings: Secondary | ICD-10-CM | POA: Insufficient documentation

## 2015-07-25 DIAGNOSIS — K921 Melena: Secondary | ICD-10-CM

## 2015-07-25 DIAGNOSIS — R102 Pelvic and perineal pain: Secondary | ICD-10-CM

## 2015-07-25 DIAGNOSIS — Z113 Encounter for screening for infections with a predominantly sexual mode of transmission: Secondary | ICD-10-CM | POA: Insufficient documentation

## 2015-07-25 DIAGNOSIS — R350 Frequency of micturition: Secondary | ICD-10-CM

## 2015-07-25 DIAGNOSIS — R109 Unspecified abdominal pain: Secondary | ICD-10-CM

## 2015-07-25 LAB — POCT URINALYSIS DIPSTICK
GLUCOSE UA: NEGATIVE
Ketones, UA: NEGATIVE
LEUKOCYTES UA: NEGATIVE
Nitrite, UA: NEGATIVE
Protein, UA: NEGATIVE

## 2015-07-25 NOTE — Patient Instructions (Signed)
Call and get appt w/ GI (gastrointestinal doctor) Let us know if low abdominal pain or brown discharge not improving

## 2015-07-25 NOTE — Progress Notes (Signed)
Patient ID: Patricia Williamson, female   DOB: 04/12/1986, 30 y.o.   MRN: [id number] Subjective:   Patricia Williamson is a 30 y.o. [id number] Caucasian female here for a routine FP Mcaid well-woman exam.  No LMP recorded. Patient has had an implant.    Current complaints: went to ED 1wk ago and they put bp cuff on Lt arm where nexplanon is and states it hurt really bad when it was getting her bp, then after she went home she began having brown vaginal d/c without odor/itching/irriation, sharp lower abdominal pain after sex, and urinary frequency. Feels like something may be wrong w/ nexplanon.  Not having periods on nexplanon, brown d/c is not normal for her.  She also reports bloody stools x 72yrs since having last baby. Reports bright red blood w/ each bm, sometimes just pure blood and no stool comes out when she sits down, pain w/ bm's. Normally has at least 1 bm/day, doesn't feel like she has to strain/or that stool is hard. Hasn't seen anyone about this. No hemorrhoids she's aware of.  PCP: none        Social History: Sexual: heterosexual Marital Status: married Living situation: with family Occupation: stay at home mom Tobacco/alcohol: smokes 1ppd, etoh: none Illicit drugs: no history of illicit drug use  The following portions of the patient's history were reviewed and updated as appropriate: allergies, current medications, past family history, past medical history, past social history, past surgical history and problem list.  Past Medical History Past Medical History  Diagnosis Date  . Asthma   . Hx of constipation   . Nexplanon insertion 10/11/2013    Inserted left arm 10/11/13 remove 5/27 /18  . Gall stones   . Carpal tunnel syndrome     Past Surgical History Past Surgical History  Procedure Laterality Date  . Cystoscopy w/ ureteral stent placement    . Cholecystectomy N/A 04/06/2014    Procedure: LAPAROSCOPIC CHOLECYSTECTOMY;  Surgeon: Dalia Heading, MD;  Location: [facility];  Service:  General;  Laterality: N/A;    Gynecologic History [id number]  No LMP recorded. Patient has had an implant. Contraception: Nexplanon, 09/2016 Last Pap: 2014. Results were: normal Last mammogram: never. Results were: n/a Last TCS: never  Obstetric History OB History  Gravida Para Term Preterm AB SAB TAB Ectopic Multiple Living  # Outcome Date GA Lbr Len/2nd Weight Sex Delivery Anes PTL Lv  2 Preterm 05/22/13 [redacted]w[redacted]d 01:35 / 02:18 4 lb 11.8 oz (2.15 kg) F Vag-Spont EPI  Y  1 Term 01/02/10 [redacted]w[redacted]d  6 lb 5 oz (2.863 kg) F Vag-Spont EPI  Y      Current Medications Current Outpatient Prescriptions on File Prior to Visit  Medication Sig Dispense Refill  . acetaminophen (TYLENOL) 500 MG tablet Take 1,000 mg by mouth every 6 (six) hours as needed for pain (For headache.).    Marland Kitchen albuterol (PROVENTIL HFA;VENTOLIN HFA) 108 (90 BASE) MCG/ACT inhaler Inhale 2 puffs into the lungs every 6 (six) hours as needed for wheezing. 1 Inhaler 2  . etonogestrel (NEXPLANON) 68 MG IMPL implant Inject 1 each into the skin once.    Marland Kitchen ibuprofen (ADVIL,MOTRIN) 200 MG tablet Take 200 mg by mouth as needed.     No current facility-administered medications on file prior to visit.    Review of Systems Patient denies any headaches, blurred vision, shortness of breath, chest pain Objective:  BP 132/60 mmHg  Pulse 96  Ht 5\' 1"  (1.549 m)  Wt 199 lb (90.266 kg)  BMI 37.62 kg/m2 Physical Exam  General:  Well developed, well nourished, no acute distress. She is alert and oriented x3. Skin:  Warm and dry Neck:  Midline trachea, no thyromegaly or nodules Cardiovascular: Regular rate and rhythm, no murmur heard Lungs:  Effort normal, all lung fields clear to auscultation bilaterally Breasts:  No dominant palpable mass, retraction, or nipple discharge Abdomen:  Soft, non tender, no hepatosplenomegaly or masses Pelvic:  External genitalia is normal in appearance.  The vagina is normal in appearance. There  is no d/c, no brown blood/discharge seen. Nonodorous. The cervix is bulbous, no CMT.  Thin prep pap is done w/ reflex HR HPV cotesting. Uterus is felt to be normal size, shape, and contour- slight tenderness w/ bimanual and palpation of uterus.  No adnexal masses or tenderness noted. Rectal: Good sphincter tone, no polyps, or hemorrhoids felt. Possible posterior wall anal fissure- positive tenderness/pain in this area  Hemoccult: neg Extremities:  No swelling or varicosities noted. Nexplanon palpated in Lt arm and feels to be intact/not broken.  Psych:  She has a normal mood and affect  Assessment:   Healthy well-woman FP Mcaid exam Post coital lower pelvic pain Brown vag d/c seems to have resolved Bloody stools, possible anal fissure Urinary frequency  Plan:  Referral to GI for bloody stools, possible anal fissure Let us know if post coital pain/brown d/c isn't improving Will send urine cx for urinary frequency F/U [age]yr for phsyical, or sooner if needed GC/CT from pap, HIV, RPR today Mammogram @30yo  or sooner if problems Colonoscopy @52 , or sooner per GI/ if problems  Marge DuncansBooker, Baley Shands Randall CNM, [facility]NP-BC 07/25/2015 12:11 PM

## 2015-07-26 LAB — RPR: RPR Ser Ql: NONREACTIVE

## 2015-07-26 LAB — CYTOLOGY - PAP

## 2015-07-26 LAB — HIV ANTIBODY (ROUTINE TESTING W REFLEX): HIV Screen 4th Generation wRfx: NONREACTIVE

## 2015-07-27 LAB — URINE CULTURE

## 2015-07-31 ENCOUNTER — Encounter: Payer: Self-pay | Admitting: Internal Medicine

## 2015-08-05 ENCOUNTER — Telehealth: Payer: Self-pay | Admitting: *Deleted

## 2015-08-05 NOTE — Telephone Encounter (Signed)
Pt informed of WNL pap, GC/CHL, RPR, HIV, Urine culture all negative. Pt verbalized understanding.

## 2015-08-14 ENCOUNTER — Telehealth: Payer: Self-pay | Admitting: Nurse Practitioner

## 2015-08-14 ENCOUNTER — Encounter: Payer: Self-pay | Admitting: Nurse Practitioner

## 2015-08-14 ENCOUNTER — Ambulatory Visit: Payer: Medicaid Other | Admitting: Nurse Practitioner

## 2015-08-14 NOTE — Telephone Encounter (Signed)
Noted  

## 2015-08-14 NOTE — Telephone Encounter (Signed)
PATIENT WAS A NO SHOW AND LETTER SENT  °

## 2015-09-18 ENCOUNTER — Emergency Department (HOSPITAL_COMMUNITY)
Admission: EM | Admit: 2015-09-18 | Discharge: 2015-09-18 | Disposition: A | Discharge status: Home / Self Care | Payer: No Typology Code available for payment source | Attending: Emergency Medicine | Admitting: Emergency Medicine

## 2015-09-18 ENCOUNTER — Encounter (HOSPITAL_COMMUNITY): Payer: Self-pay | Admitting: Emergency Medicine

## 2015-09-18 DIAGNOSIS — H9202 Otalgia, left ear: Secondary | ICD-10-CM | POA: Diagnosis not present

## 2015-09-18 DIAGNOSIS — Z5321 Procedure and treatment not carried out due to patient leaving prior to being seen by health care provider: Secondary | ICD-10-CM | POA: Diagnosis not present

## 2015-09-18 DIAGNOSIS — F17200 Nicotine dependence, unspecified, uncomplicated: Secondary | ICD-10-CM | POA: Diagnosis not present

## 2015-09-18 NOTE — ED Notes (Signed)
Patient walked out of department at this time stating "my daughters school is calling, i need to go call them back" patient ambulatory with steady gait, no deficits noted.
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
## 2015-11-05 IMAGING — US US ABDOMEN COMPLETE
1 series · 13 of 25 positions shown · non-contrast
Comparison: CT abdomen and pelvis November 10, 2006

CLINICAL DATA: One day history of abdominal pain, generalized

EXAM:
ULTRASOUND ABDOMEN COMPLETE

[Series 1: us abdomen complete · 0.21mm/px · 13 of 101 slices shown]
[im 1/101]
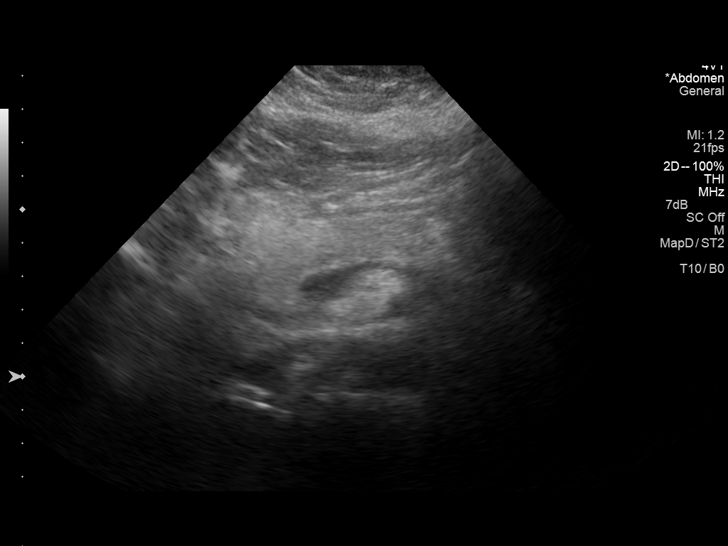
[im 9/101]
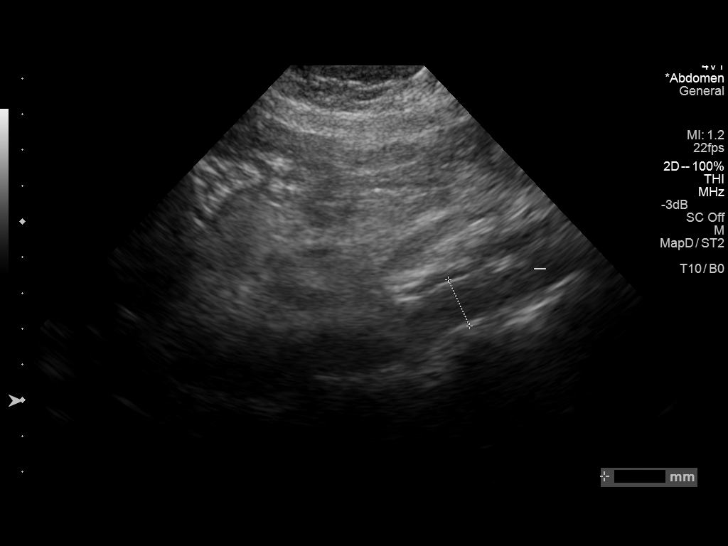
[im 17/101]
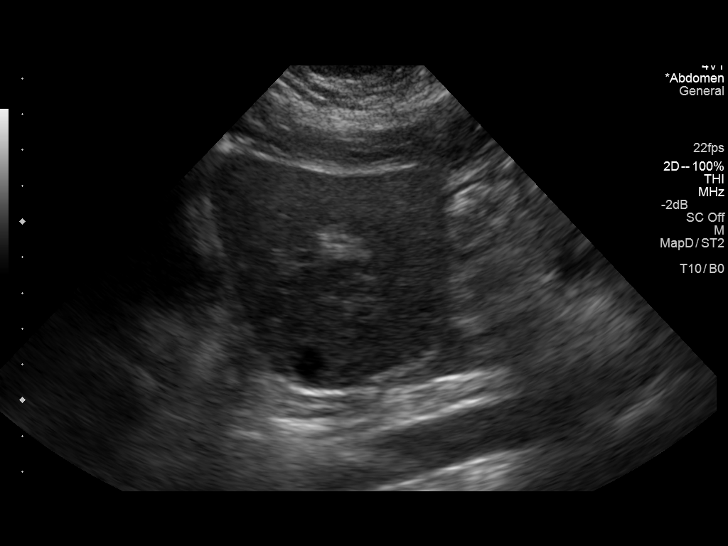
[im 26/101]
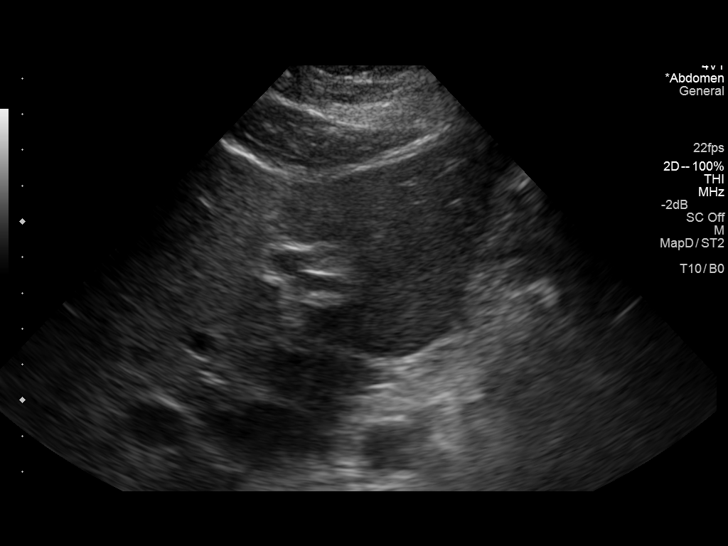
[im 34/101]
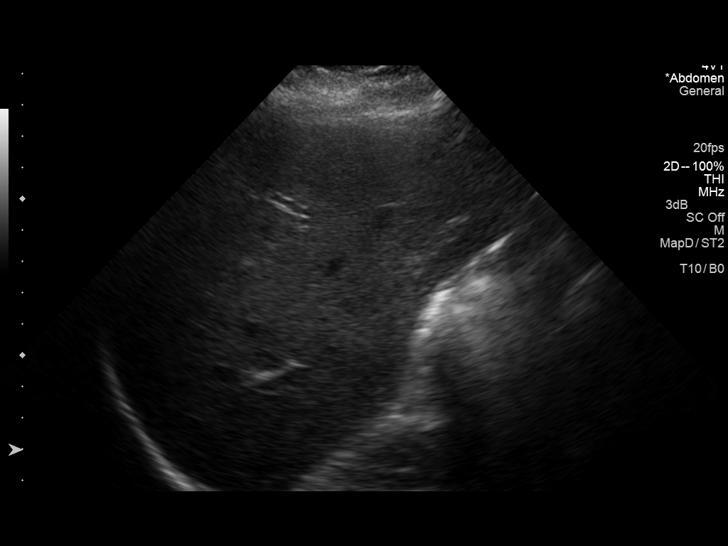
[im 42/101]
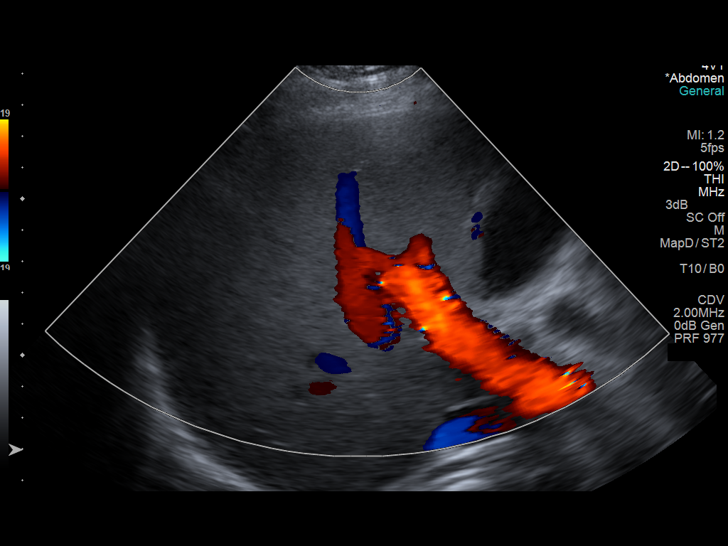
[im 51/101]
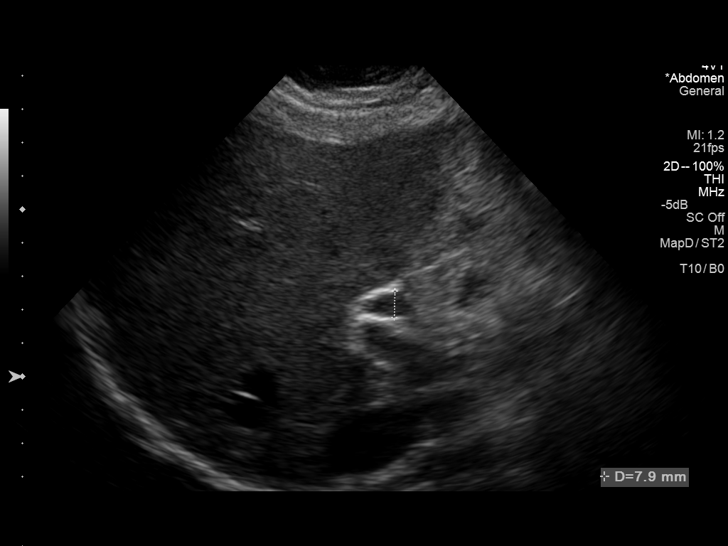
[im 59/101]
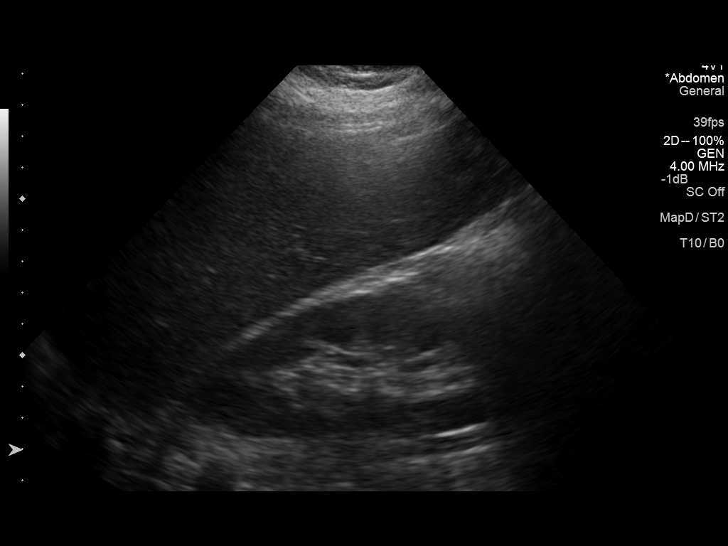
[im 67/101]
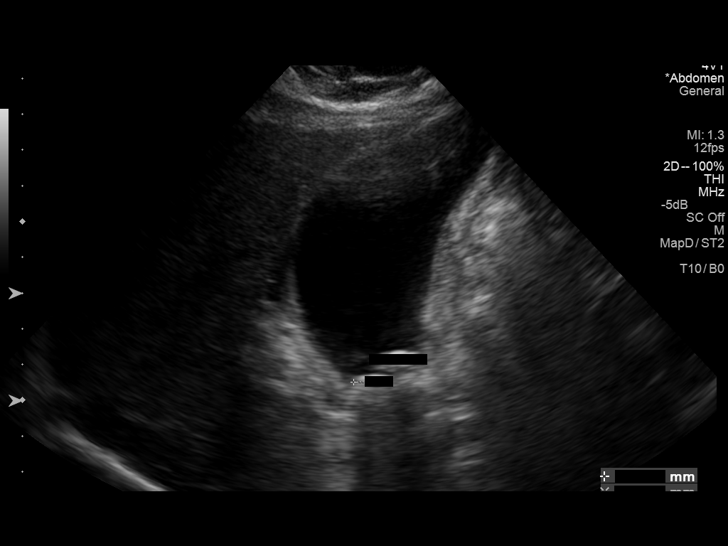
[im 76/101]
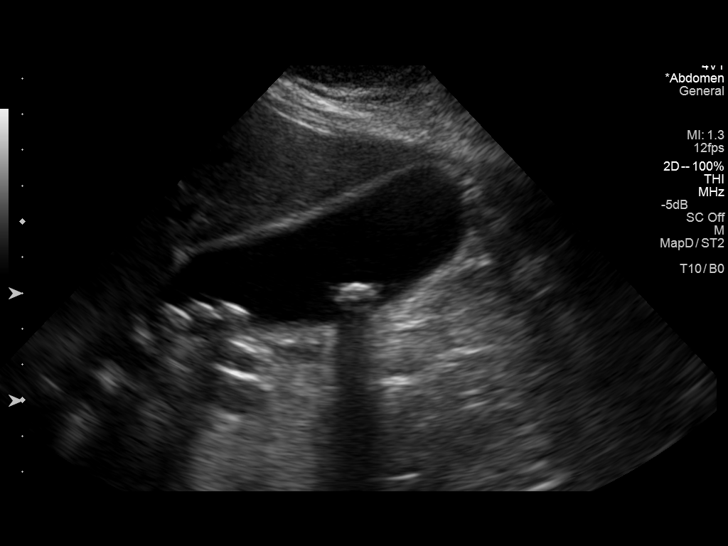
[im 84/101]
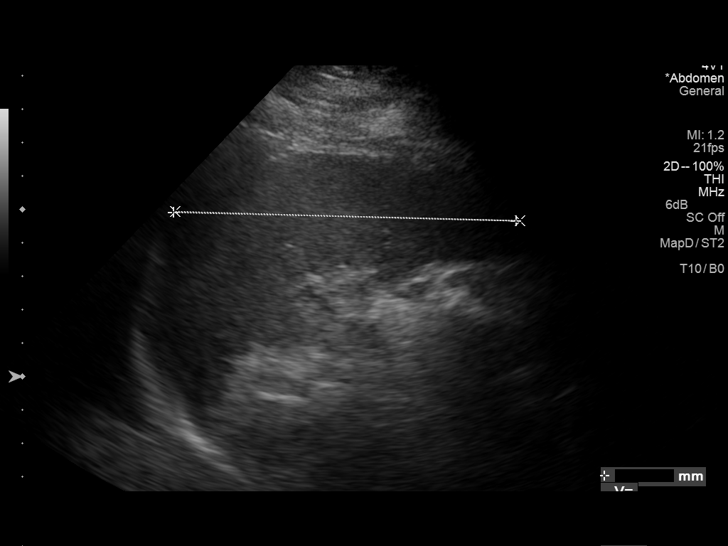
[im 92/101]
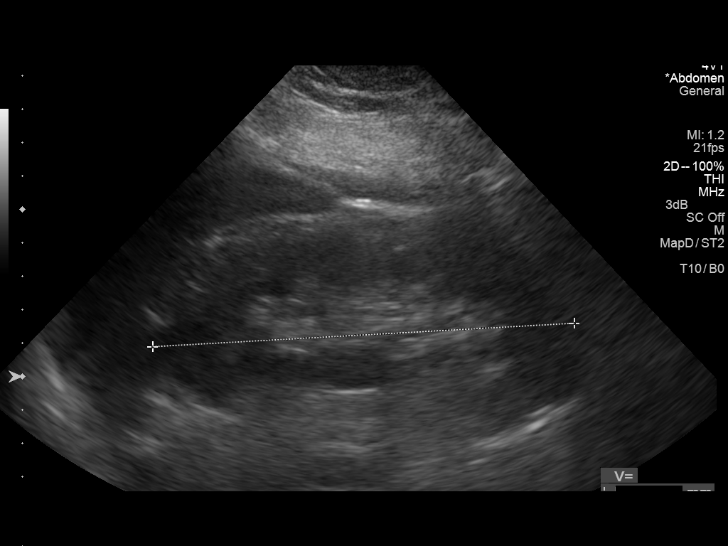
[im 101/101]
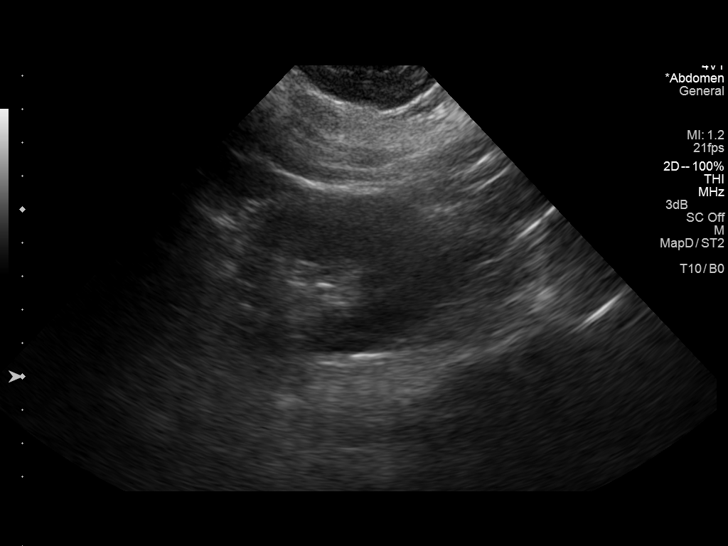

[13 of 25 positions shown; findings below may reference images not displayed]

FINDINGS: Gallbladder: Within the gallbladder, there are echogenic foci which
move and shadow consistent with gallstones. The largest gallstone
measures 14 mm in length. There is mild gallbladder wall thickening.
There is no appreciable pericholecystic fluid. No sonographic Murphy
sign noted.

Common bile duct: Diameter: 8 mm. No mass or calculus is seen in the
biliary ductal system.

Liver: No focal lesion identified. Within normal limits in
parenchymal echogenicity. Liver is mildly prominent measuring
cm in length.

IVC: No abnormality visualized.

Pancreas: Visualized portion unremarkable. A portion of the tail of
the pancreas is obscured by gas.

Spleen: Spleen is borderline prominent measuring 11.8 x 6.2 x
cm. Splenic volume is measured at 434 cubic cm. No focal splenic
lesions are identified.

Right Kidney: Length: 12.0 cm. Echogenicity within normal limits. No
mass or hydronephrosis visualized.

Left Kidney: Length: 12.7 cm. Echogenicity within normal limits. No
mass or hydronephrosis visualized.

Abdominal aorta: No aneurysm visualized.

Other findings: No demonstrable ascites.
IMPRESSION: Gallstones with gallbladder wall thickening. Suspect acute
cholecystitis. Common bile duct is prominent measuring 8 mm in
diameter. No mass or calculus is seen in the biliary ductal system.
Question recent calculus passage as an etiology for the biliary duct
prominence. Note that there is no intrahepatic biliary duct
dilatation.

Liver is mildly prominent but otherwise unremarkable in appearance.

Pancreatic tail is obscured by gas. Remainder of pancreas appears
normal.

Spleen mildly prominent but otherwise normal in appearance.

## 2016-05-06 ENCOUNTER — Ambulatory Visit (INDEPENDENT_AMBULATORY_CARE_PROVIDER_SITE_OTHER): Payer: Medicaid Other | Admitting: Obstetrics and Gynecology

## 2016-05-06 ENCOUNTER — Encounter: Payer: Self-pay | Admitting: Obstetrics and Gynecology

## 2016-05-06 VITALS — BP 128/68 | HR 80 | Wt 188.2 lb

## 2016-05-06 DIAGNOSIS — N921 Excessive and frequent menstruation with irregular cycle: Secondary | ICD-10-CM | POA: Insufficient documentation

## 2016-05-06 DIAGNOSIS — Z308 Encounter for other contraceptive management: Secondary | ICD-10-CM | POA: Diagnosis not present

## 2016-05-06 DIAGNOSIS — Z3202 Encounter for pregnancy test, result negative: Secondary | ICD-10-CM

## 2016-05-06 DIAGNOSIS — Z975 Presence of (intrauterine) contraceptive device: Principal | ICD-10-CM

## 2016-05-06 LAB — POCT URINE PREGNANCY: Preg Test, Ur: NEGATIVE

## 2016-05-06 NOTE — Progress Notes (Signed)
   [facility] Visit  05/06/16            Patient name: Patricia Williamson MRN [id number]  Date of birth: [birth year]  CC & HPI:  Patricia Williamson is a 30 y.o. female presenting today for persistent bleeding on nexplanon since 05/02/16. She states she is due to have the nexplanon removed spring 2018. She is concerned for potential pregnancy; reports both prior pregnancies started with similar type of bleeding with multiple negative home pregnancy tests before it was positive. She denies any other issues related to nexplanon or otherwise at this time.   ROS:  ROS +vaginal bleeding No other acute complains at this time   Pertinent History Reviewed:   Reviewed: Significant for  Medical         Past Medical History:  Diagnosis Date  . Asthma   . Carpal tunnel syndrome   . Gall stones   . Hx of constipation   . Nexplanon insertion 10/11/2013   Inserted left arm 10/11/13 remove 5/27 /18                              Surgical Hx:    Past Surgical History:  Procedure Laterality Date  . CHOLECYSTECTOMY N/A 04/06/2014   Procedure: LAPAROSCOPIC CHOLECYSTECTOMY;  Surgeon: Dalia HeadingMark A Jenkins, MD;  Location: [facility];  Service: General;  Laterality: N/A;  . CYSTOSCOPY W/ URETERAL STENT PLACEMENT     Medications: Reviewed & Updated - see associated section                       Current Outpatient Prescriptions:  .  albuterol (PROVENTIL HFA;VENTOLIN HFA) 108 (90 BASE) MCG/ACT inhaler, Inhale 2 puffs into the lungs every 6 (six) hours as needed for wheezing., Disp: 1 Inhaler, Rfl: 2 .  etonogestrel (NEXPLANON) 68 MG IMPL implant, Inject 1 each into the skin once., Disp: , Rfl:  .  acetaminophen (TYLENOL) 500 MG tablet, Take 1,000 mg by mouth every 6 (six) hours as needed for pain (For headache.)., Disp: , Rfl:  .  ibuprofen (ADVIL,MOTRIN) 200 MG tablet, Take 200 mg by mouth as needed., Disp: , Rfl:    Social History: Reviewed -  reports that she has been smoking Cigarettes.  She has a 1.00  pack-year smoking history. She has never used smokeless tobacco.  Objective Findings:  Vitals: Blood pressure 128/68, pulse 80, weight 188 lb 3.2 oz (85.4 kg), last menstrual period 05/02/2016.  Physical Examination: Pelvic -  VULVA: normal appearing vulva with no masses, tenderness or lesions,  VAGINA: normal appearing vagina with normal color and discharge, no lesions,  CERVIX: normal appearing cervix without discharge or lesions   Assessment & Plan:   A:  1. AUB on nexplanon  P:  1. Take Megace TID on days of spotting  2. Follow up in 6 months when nexplanon due for replacement.     By signing my name below, I, Sonum Patel, attest that this documentation has been prepared under the direction and in the presence of Tilda BurrowJohn V Zayla Agar, MD. Electronically Signed: Sonum Patel, Neurosurgeoncribe. 05/06/16. 3:05 PM.  I personally performed the services described in this documentation, which was SCRIBED in my presence. The recorded information has been reviewed and considered accurate. It has been edited as necessary during review. Tilda BurrowFERGUSON,Maneh Sieben V, MD

## 2016-09-11 ENCOUNTER — Encounter (HOSPITAL_COMMUNITY): Payer: Self-pay | Admitting: *Deleted

## 2016-09-11 ENCOUNTER — Emergency Department (HOSPITAL_COMMUNITY)
Admission: EM | Admit: 2016-09-11 | Discharge: 2016-09-11 | Disposition: A | Discharge status: Home / Self Care | Payer: Self-pay | Attending: Emergency Medicine | Admitting: Emergency Medicine

## 2016-09-11 ENCOUNTER — Emergency Department (HOSPITAL_COMMUNITY): Payer: Self-pay

## 2016-09-11 DIAGNOSIS — Y929 Unspecified place or not applicable: Secondary | ICD-10-CM | POA: Insufficient documentation

## 2016-09-11 DIAGNOSIS — Y9389 Activity, other specified: Secondary | ICD-10-CM | POA: Insufficient documentation

## 2016-09-11 DIAGNOSIS — M25562 Pain in left knee: Secondary | ICD-10-CM | POA: Insufficient documentation

## 2016-09-11 DIAGNOSIS — J45909 Unspecified asthma, uncomplicated: Secondary | ICD-10-CM | POA: Insufficient documentation

## 2016-09-11 DIAGNOSIS — W1839XA Other fall on same level, initial encounter: Secondary | ICD-10-CM | POA: Insufficient documentation

## 2016-09-11 DIAGNOSIS — W1781XA Fall down embankment (hill), initial encounter: Secondary | ICD-10-CM

## 2016-09-11 DIAGNOSIS — Y999 Unspecified external cause status: Secondary | ICD-10-CM | POA: Insufficient documentation

## 2016-09-11 DIAGNOSIS — Z79899 Other long term (current) drug therapy: Secondary | ICD-10-CM | POA: Insufficient documentation

## 2016-09-11 DIAGNOSIS — F17210 Nicotine dependence, cigarettes, uncomplicated: Secondary | ICD-10-CM | POA: Insufficient documentation

## 2016-09-11 MED ORDER — HYDROCODONE-ACETAMINOPHEN 5-325 MG PO TABS: ORAL_TABLET | ORAL | 0 refills | Status: DC

## 2016-09-11 MED ORDER — IBUPROFEN 800 MG PO TABS
800.0000 mg | ORAL_TABLET | Freq: Once | ORAL | Status: AC
  Administered 2016-09-11: 800 mg via ORAL
  Filled 2016-09-11: qty 1

## 2016-09-11 MED ORDER — IBUPROFEN 800 MG PO TABS: 800.0000 mg | ORAL_TABLET | Freq: Three times a day (TID) | ORAL | 0 refills | Status: DC

## 2016-09-11 MED ORDER — HYDROCODONE-ACETAMINOPHEN 5-325 MG PO TABS
1.0000 | ORAL_TABLET | Freq: Once | ORAL | Status: AC
  Administered 2016-09-11: 1 via ORAL
  Filled 2016-09-11: qty 1

## 2016-09-11 NOTE — ED Triage Notes (Signed)
Left knee pain after playing badminton and going down on knee   Pt reports that she has no PCP

## 2016-09-11 NOTE — ED Provider Notes (Signed)
[facility] Provider Note   CSN: [id number] Arrival date & time: 09/11/16  2012     History   Chief Complaint Chief Complaint  Patient presents with  . Knee Pain    HPI Patricia Williamson is a 31 y.o. female.  HPI   Patricia Williamson is a 31 y.o. female who presents to the Emergency Department complaining of left knee pain since earlier today.  She reports playing badminton and slipped in the mud, landing on her left side.  She is unsure of mechanism of knee injury. She continued to walk and bear weight today, but pain has worsened through the day.  She states that tonight she is unable to bend her knee without significant pain and reports swelling to the knee.  She has not tried ice or taken any pain relievers.  She denies other injuries, numbness or pain to her beck, neck or head injury.    Past Medical History:  Diagnosis Date  . Asthma   . Carpal tunnel syndrome   . Gall stones   . Hx of constipation   . Nexplanon insertion 10/11/2013   Inserted left arm 10/11/13 remove 5/27 /18    Patient Active Problem List   Diagnosis Date Noted  . Breakthrough bleeding on Nexplanon 05/06/2016  . Bloody stools 07/25/2015  . Nexplanon insertion 10/11/2013  . Abnormal uterine bleeding (AUB) 08/03/2013  . Asthma exacerbation 02/22/2013  . Bronchitis with asthma, acute 02/22/2013    Past Surgical History:  Procedure Laterality Date  . CHOLECYSTECTOMY N/A 04/06/2014   Procedure: LAPAROSCOPIC CHOLECYSTECTOMY;  Surgeon: Dalia Heading, MD;  Location: [facility];  Service: General;  Laterality: N/A;  . CYSTOSCOPY W/ URETERAL STENT PLACEMENT      OB History    Gravida Para Term Preterm AB Living   SAB TAB Ectopic Multiple Live Births           2       Home Medications    Prior to Admission medications   Medication Sig Start Date End Date Taking? Authorizing Provider  acetaminophen (TYLENOL) 500 MG tablet Take 1,000 mg by mouth every 6 (six) hours as needed for  pain (For headache.).    Historical Provider, MD  albuterol (PROVENTIL HFA;VENTOLIN HFA) 108 (90 BASE) MCG/ACT inhaler Inhale 2 puffs into the lungs every 6 (six) hours as needed for wheezing. 02/22/13   Tilda Burrow, MD  etonogestrel (NEXPLANON) 68 MG IMPL implant Inject 1 each into the skin once.    Historical Provider, MD  HYDROcodone-acetaminophen (NORCO/VICODIN) 5-325 MG tablet Take one tab po q 4-6 hrs prn pain 09/11/16   Clothilde Tippetts, PA-C  ibuprofen (ADVIL,MOTRIN) 800 MG tablet Take 1 tablet (800 mg total) by mouth 3 (three) times daily. 09/11/16   Chigozie Basaldua, PA-C    Family History Family History  Problem Relation Age of Onset  . Hypertension Mother   . Diabetes Mother   . Hyperlipidemia Mother   . Hypertension Brother   . Diabetes Maternal Grandmother   . Heart disease Maternal Grandmother   . Other Paternal Aunt     benign breast lump    Social History Social History  Substance Use Topics  . Smoking status: Current Every Day Smoker    Packs/day: 0.25    Years: 4.00    Types: Cigarettes  . Smokeless tobacco: Never Used  . Alcohol use No     Allergies   Vinegar [acetic acid]  and Sulfa antibiotics   Review of Systems Review of Systems  Constitutional: Negative for chills and fever.  Genitourinary: Negative for difficulty urinating and dysuria.  Musculoskeletal: Positive for arthralgias (left knee pain ) and joint swelling.  Skin: Negative for color change and wound.  Neurological: Negative for dizziness and weakness.  All other systems reviewed and are negative.    Physical Exam Updated Vital Signs BP 127/84 (BP Location: Left Arm)   Pulse 88   Temp 98.4 F (36.9 C) (Oral)   Resp 15   SpO2 97%   Physical Exam  Constitutional: She is oriented to person, place, and time. She appears well-developed and well-nourished. No distress.  HENT:  Head: Atraumatic.  Cardiovascular: Normal rate, regular rhythm, normal heart sounds and intact distal pulses.    Pulmonary/Chest: Effort normal and breath sounds normal.  Musculoskeletal: She exhibits edema and tenderness. She exhibits no deformity.  ttp of the anterior left knee.  No erythema or step-off deformity.  Limited ROM of the knee due to level of pain.  DP pulse brisk, distal sensation intact. Calf is soft and NT.  Neurological: She is alert and oriented to person, place, and time. She exhibits normal muscle tone. Coordination normal.  Skin: Skin is warm and dry. Capillary refill takes less than 2 seconds. No erythema.  Nursing note and vitals reviewed.    ED Treatments / Results  Labs (all labs ordered are listed, but only abnormal results are displayed) Labs Reviewed - No data to display  EKG  EKG Interpretation None       Radiology Dg Knee Complete 4 Views Left  Result Date: 09/11/2016 CLINICAL DATA:  31 year old female with fall and left knee pain. EXAM: LEFT KNEE - COMPLETE 4+ VIEW COMPARISON:  None. FINDINGS: There is no acute fracture or dislocation. The bones are well mineralized. No arthritic changes. There is a small suprapatellar effusion. The soft tissues appear unremarkable. No radiopaque foreign object. IMPRESSION: 1. No acute fracture or dislocation. 2. Small suprapatellar effusion. Electronically Signed   By: Elgie Collard M.D.   On: 09/11/2016 21:46    Procedures Procedures (including critical care time)  Medications Ordered in ED Medications  HYDROcodone-acetaminophen (NORCO/VICODIN) 5-325 MG per tablet 1 tablet (1 tablet Oral Given 09/11/16 2222)  ibuprofen (ADVIL,MOTRIN) tablet 800 mg (800 mg Oral Given 09/11/16 2222)     Initial Impression / Assessment and Plan / ED Course  I have reviewed the triage vital signs and the nursing notes.  Pertinent labs & imaging results that were available during my care of the patient were reviewed by me and considered in my medical decision making (see chart for details).    Vitals:   09/11/16 2021  BP: 127/84    Pulse: 88  Resp: 15  Temp: 98.4 F (36.9 C)    Injury to left knee with swelling to the joint.  NV intact.  Xr neg for fx but small suprapatellar effusion.  aspiration is not warranted.  Discussed possible ligamentous injury and importance of orthopedic f/u.    Knee immobilizer applied and crutches given.  Pt agrees to RICE therapy and close ortho f/u.  Appears stable for d/c   Final Clinical Impressions(s) / ED Diagnoses   Final diagnoses:  Acute pain of left knee    New Prescriptions New Prescriptions   HYDROCODONE-ACETAMINOPHEN (NORCO/VICODIN) 5-325 MG TABLET    Take one tab po q 4-6 hrs prn pain   IBUPROFEN (ADVIL,MOTRIN) 800 MG TABLET    Take 1  tablet (800 mg total) by mouth 3 (three) times daily.     Pauline Aus, PA-C 09/11/16 2251    Bethann Berkshire, MD 09/11/16 2255

## 2016-09-11 NOTE — Discharge Instructions (Signed)
Apply ice packs on and off to the knee. Wear the brace and use the crutches for standing or walking.  Call Dr. Mort Sawyers office on Monday to arrange a follow-up appointment

## 2016-09-11 NOTE — ED Notes (Signed)
Pt alert & oriented x4, stable gait. Patient given discharge instructions, paperwork & prescription(s). Patient  instructed to stop at the registration desk to finish any additional paperwork. Patient verbalized understanding. Pt left department w/ no further questions. 

## 2016-09-18 ENCOUNTER — Encounter: Payer: Self-pay | Admitting: Orthopedic Surgery

## 2016-09-18 ENCOUNTER — Ambulatory Visit (INDEPENDENT_AMBULATORY_CARE_PROVIDER_SITE_OTHER): Payer: Medicaid Other | Admitting: Orthopedic Surgery

## 2016-09-18 DIAGNOSIS — S83242A Other tear of medial meniscus, current injury, left knee, initial encounter: Secondary | ICD-10-CM

## 2016-09-18 NOTE — Patient Instructions (Addendum)
WE WILL SCHEDULE MRI FOR YOU AND CALL YOU WITH APPOINTMENT  ICE KNEE THREE TIMES DAILY  100 HEEL SLIDES PER DAY  100 quad sets per day   Use crutches

## 2016-09-18 NOTE — Progress Notes (Signed)
  NEW PATIENT OFFICE VISIT    Chief Complaint  Patient presents with  . Knee Pain    left knee pain s/p fall 09/11/16    This 31 year old female fell at her home playing badminton and now presents with new onset dull aching medial knee pain which is constant and associated with loss of motion and trouble weightbearing.    Review of Systems  Respiratory: Positive for wheezing.   Skin: Negative for itching and rash.  Neurological: Negative for tingling and focal weakness.     Past Medical History:  Diagnosis Date  . Asthma   . Carpal tunnel syndrome   . Gall stones   . Hx of constipation   . Nexplanon insertion 10/11/2013   Inserted left arm 10/11/13 remove 5/27 /18    Past Surgical History:  Procedure Laterality Date  . CHOLECYSTECTOMY N/A 04/06/2014   Procedure: LAPAROSCOPIC CHOLECYSTECTOMY;  Surgeon: Dalia HeadingMark A Jenkins, MD;  Location: [facility];  Service: General;  Laterality: N/A;  . CYSTOSCOPY W/ URETERAL STENT PLACEMENT      Family History  Problem Relation Age of Onset  . Hypertension Mother   . Diabetes Mother   . Hyperlipidemia Mother   . Hypertension Brother   . Diabetes Maternal Grandmother   . Heart disease Maternal Grandmother   . Other Paternal Aunt     benign breast lump   Social History  Substance Use Topics  . Smoking status: Current Every Day Smoker    Packs/day: 0.25    Years: 4.00    Types: Cigarettes  . Smokeless tobacco: Never Used  . Alcohol use No    There were no vitals taken for this visit.  Physical Exam  Constitutional: She is oriented to person, place, and time. She appears well-developed and well-nourished.  Musculoskeletal:       Left knee: She exhibits effusion.  Neurological: She is alert and oriented to person, place, and time.  Psychiatric: She has a normal mood and affect.  Vitals reviewed.   Right Knee Exam   Tenderness  The patient is experiencing no tenderness.     Range of Motion  Extension: normal  Flexion:  normal   Muscle Strength   The patient has normal right knee strength.  Tests  McMurray:  Medial - negative Lateral - negative Drawer:       Anterior - negative    Posterior - negative Varus: negative Valgus: negative  Other  Erythema: absent Scars: absent Sensation: normal Pulse: present Swelling: none   Left Knee Exam   Tenderness  The patient is experiencing tenderness in the medial joint line.  Range of Motion  Extension:  -10 normal  Flexion:  50 normal   Muscle Strength   The patient has normal left knee strength.  Tests  McMurray:  Medial - positive Lateral - negative Lachman:  Anterior - negative     Drawer:       Anterior - negative     Posterior - negative Varus: negative Valgus: negative  Other  Erythema: absent Scars: absent Sensation: normal Pulse: present Swelling: mild Effusion: effusion present      No orders of the defined types were placed in this encounter.   Encounter Diagnosis  Name Primary?  . Acute medial meniscus tear of left knee, initial encounter Yes     PLAN:   PRICE  MRI left knee

## 2016-09-25 ENCOUNTER — Ambulatory Visit (HOSPITAL_COMMUNITY): Admission: RE | Admit: 2016-09-25 | Payer: Medicaid Other | Source: Ambulatory Visit

## 2016-09-28 ENCOUNTER — Ambulatory Visit: Payer: Medicaid Other | Admitting: Orthopedic Surgery

## 2016-09-28 ENCOUNTER — Encounter: Payer: Self-pay | Admitting: Orthopedic Surgery

## 2016-10-13 ENCOUNTER — Encounter: Payer: Self-pay | Admitting: Advanced Practice Midwife

## 2016-10-13 ENCOUNTER — Ambulatory Visit (INDEPENDENT_AMBULATORY_CARE_PROVIDER_SITE_OTHER): Payer: Medicaid Other | Admitting: Advanced Practice Midwife

## 2016-10-13 VITALS — BP 120/70 | HR 72 | Wt 189.0 lb

## 2016-10-13 DIAGNOSIS — Z3049 Encounter for surveillance of other contraceptives: Secondary | ICD-10-CM | POA: Diagnosis not present

## 2016-10-13 DIAGNOSIS — Z3046 Encounter for surveillance of implantable subdermal contraceptive: Secondary | ICD-10-CM

## 2016-10-13 DIAGNOSIS — Z30017 Encounter for initial prescription of implantable subdermal contraceptive: Secondary | ICD-10-CM

## 2016-10-13 DIAGNOSIS — Z3202 Encounter for pregnancy test, result negative: Secondary | ICD-10-CM

## 2016-10-13 LAB — POCT URINE PREGNANCY: Preg Test, Ur: NEGATIVE

## 2016-10-13 NOTE — Progress Notes (Signed)
Patricia Williamson 31 y.o. Vitals:   10/13/16 0850  BP: 120/70  Pulse: 72    Past Medical History:  Diagnosis Date  . Asthma   . Carpal tunnel syndrome   . Gall stones   . Hx of constipation   . Nexplanon insertion 10/11/2013   Inserted left arm 10/11/13 remove 5/27 /18    Family History  Problem Relation Age of Onset  . Hypertension Mother   . Diabetes Mother   . Hyperlipidemia Mother   . Hypertension Brother   . Diabetes Maternal Grandmother   . Heart disease Maternal Grandmother   . Other Paternal Aunt        benign breast lump    Social History   Social History  . Marital status: Married    Spouse name: N/A  . Number of children: N/A  . Years of education: N/A   Occupational History  . Not on file.   Social History Main Topics  . Smoking status: Current Every Day Smoker    Packs/day: 0.25    Years: 4.00    Types: Cigarettes  . Smokeless tobacco: Never Used  . Alcohol use No  . Drug use: No  . Sexual activity: Yes    Birth control/ protection: Implant   Other Topics Concern  . Not on file   Social History Narrative  . No narrative on file    HPI:  Patricia Williamson is here for Nexplanon removal and reinsertion.  She was given informed consent for removal of her Nexplanon and reinsertion of a new one.A signed copy is in the chart.  Appropriate time out taken. Nexlanon site (left arm) identified and thea area was prepped in usual sterile fashon. 2 cc of 1% lidocaine was used to anesthetize the area starting with the distal end of the implant. A small stab incision was made right beside the implant on the distal portion.  The Nexplanon rod was grasped using hemostats and removed without difficulty.  There was less than 3 cc blood loss. There were no complications. Next, the area was cleansed again and the new Nexplanon was inserted without difficulty.  Steri strips and a pressure bandage were applied.  Pt was instructed to remove pressure bandage in a few hours,  and keep insertion site covered with a bandaid for 3 days.  Follow-up scheduled PRN problems  The above done by Jonetta Speakarol Hartsog, SNM under my direct supervision  CRESENZO-DISHMAN,Ronnette Rump 10/13/2016 9:28 AM

## 2017-01-06 ENCOUNTER — Telehealth: Payer: Self-pay | Admitting: *Deleted

## 2017-01-06 NOTE — Telephone Encounter (Signed)
Spoke with pt. Pt states she got Nexplanon placed 10/13/16. A few weeks ago, she started having trouble sleeping; + mood swings. Pt had chest pains that started Saturday and still ongoing. Also, hip pain. I advised she needs to be seen today for chest pain. I advised ER for chest pain and can discuss other symptoms while she is there. Pt voiced understanding. Pt don't have a PCP. JSY

## 2017-04-15 ENCOUNTER — Encounter (HOSPITAL_COMMUNITY): Payer: Self-pay | Admitting: Emergency Medicine

## 2017-04-15 ENCOUNTER — Emergency Department (HOSPITAL_COMMUNITY)
Admission: EM | Admit: 2017-04-15 | Discharge: 2017-04-15 | Disposition: A | Discharge status: Home / Self Care | Payer: Medicaid Other | Attending: Emergency Medicine | Admitting: Emergency Medicine

## 2017-04-15 ENCOUNTER — Other Ambulatory Visit: Payer: Self-pay

## 2017-04-15 DIAGNOSIS — H6983 Other specified disorders of Eustachian tube, bilateral: Secondary | ICD-10-CM | POA: Insufficient documentation

## 2017-04-15 DIAGNOSIS — R0981 Nasal congestion: Secondary | ICD-10-CM | POA: Insufficient documentation

## 2017-04-15 DIAGNOSIS — F17210 Nicotine dependence, cigarettes, uncomplicated: Secondary | ICD-10-CM | POA: Diagnosis not present

## 2017-04-15 DIAGNOSIS — H9203 Otalgia, bilateral: Secondary | ICD-10-CM | POA: Insufficient documentation

## 2017-04-15 MED ORDER — PSEUDOEPHEDRINE HCL 60 MG PO TABS: 60.0000 mg | ORAL_TABLET | Freq: Four times a day (QID) | ORAL | 0 refills | Status: DC | PRN

## 2017-04-15 NOTE — ED Triage Notes (Signed)
Bi-lateral ear pain x 3 days 

## 2017-04-15 NOTE — Discharge Instructions (Signed)
As discussed, you may try the medication prescribed as this can help relieve your symptoms sooner, although  I would expect your ear complaint to improve once your nasal congestion is resolved.  Other things you may try include steam therapy, menthol cough drops, strong peppermint candies all which can relieve nasal and sinus pain and  congestion.

## 2017-04-16 NOTE — ED Provider Notes (Signed)
[facility] Provider Note   CSN: [id number] Arrival date & time: 04/15/17  1842     History   Chief Complaint Chief Complaint  Patient presents with  . Otalgia    HPI Patricia Williamson is a 31 y.o. female presenting with a 3-day history of bilateral ear pain in addition to nasal congestion with clear rhinorrhea.  She reports pressure sensation in both of her ears with decreased hearing acuity which is been intermittent.  She has a fluid sensation in her ears, but denies any drainage from either ear.  She thought her ears were impacted so try using a Q-tip yesterday with little improvement in her symptoms and without any significant cerumen removal.  She denies fevers or chills, sore throat, mouth pain, neck pain.  She also denies headaches, but does endorse nasal and sinus pressure.  She has had no medications prior to arrival for her symptoms..  The history is provided by the patient.    Past Medical History:  Diagnosis Date  . Asthma   . Carpal tunnel syndrome   . Gall stones   . Hx of constipation   . Nexplanon insertion 10/11/2013   Inserted left arm 10/11/13 remove 5/27 /18    Patient Active Problem List   Diagnosis Date Noted  . Breakthrough bleeding on Nexplanon 05/06/2016  . Bloody stools 07/25/2015  . Nexplanon insertion 10/11/2013  . Abnormal uterine bleeding (AUB) 08/03/2013  . Asthma exacerbation 02/22/2013  . Bronchitis with asthma, acute 02/22/2013    Past Surgical History:  Procedure Laterality Date  . CHOLECYSTECTOMY N/A 04/06/2014   Procedure: LAPAROSCOPIC CHOLECYSTECTOMY;  Surgeon: Dalia HeadingMark A Jenkins, MD;  Location: [facility];  Service: General;  Laterality: N/A;  . CYSTOSCOPY W/ URETERAL STENT PLACEMENT      OB History    Gravida Para Term Preterm AB Living   2 2 1 1   2    SAB TAB Ectopic Multiple Live Births           2       Home Medications    Prior to Admission medications   Medication Sig Start Date End Date Taking?  Authorizing Provider  acetaminophen (TYLENOL) 500 MG tablet Take 1,000 mg by mouth every 6 (six) hours as needed for pain (For headache.).    [provider]  albuterol (PROVENTIL HFA;VENTOLIN HFA) 108 (90 BASE) MCG/ACT inhaler Inhale 2 puffs into the lungs every 6 (six) hours as needed for wheezing. Patient not taking: Reported on 10/13/2016 02/22/13   Tilda BurrowFerguson, John V, MD  etonogestrel (NEXPLANON) 68 MG IMPL implant Inject 1 each into the skin once.    [provider]  HYDROcodone-acetaminophen (NORCO/VICODIN) 5-325 MG tablet Take one tab po q 4-6 hrs prn pain Patient not taking: Reported on 10/13/2016 09/11/16   Triplett, Tammy, PA-C  ibuprofen (ADVIL,MOTRIN) 800 MG tablet Take 1 tablet (800 mg total) by mouth 3 (three) times daily. 09/11/16   Triplett, Tammy, PA-C  pseudoephedrine (SUDAFED) 60 MG tablet Take 1 tablet (60 mg total) by mouth every 6 (six) hours as needed for congestion. 04/15/17   Burgess AmorIdol, Isley Weisheit, PA-C    Family History Family History  Problem Relation Age of Onset  . Hypertension Mother   . Diabetes Mother   . Hyperlipidemia Mother   . Hypertension Brother   . Diabetes Maternal Grandmother   . Heart disease Maternal Grandmother   . Other Paternal Aunt        benign breast lump  Social History Social History   Tobacco Use  . Smoking status: Current Every Day Smoker    Packs/day: 0.25    Years: 4.00    Pack years: 1.00    Types: Cigarettes  . Smokeless tobacco: Never Used  Substance Use Topics  . Alcohol use: No  . Drug use: No     Allergies   Vinegar [acetic acid] and Sulfa antibiotics   Review of Systems Review of Systems  Constitutional: Negative for chills and fever.  HENT: Positive for congestion, ear pain, hearing loss, rhinorrhea and sinus pressure. Negative for sore throat, trouble swallowing and voice change.   Eyes: Negative for discharge.  Respiratory: Negative for cough, shortness of breath, wheezing and stridor.     Cardiovascular: Negative for chest pain.  Gastrointestinal: Negative for abdominal pain.  Genitourinary: Negative.   Neurological: Negative for headaches.     Physical Exam Updated Vital Signs BP 113/62 (BP Location: Right Arm)   Pulse 75   Temp 98.3 F (36.8 C) (Oral)   Resp 20   Ht 5\' 1"  (1.549 m)   Wt 85.7 kg (189 lb)   SpO2 99%   BMI 35.71 kg/m   Physical Exam  Constitutional: She is oriented to person, place, and time. She appears well-developed and well-nourished.  HENT:  Head: Normocephalic and atraumatic.  Right Ear: Ear canal normal. Tympanic membrane is retracted.  Left Ear: Ear canal normal. Tympanic membrane is retracted.  Nose: Mucosal edema present. No rhinorrhea.  Mouth/Throat: Uvula is midline, oropharynx is clear and moist and mucous membranes are normal. No oropharyngeal exudate, posterior oropharyngeal edema, posterior oropharyngeal erythema or tonsillar abscesses.  Slight retraction of bilateral TMs.  There is no erythema, no air fluid level appreciated.  External canals are clear except for a small nonocclusive piece of cerumen in her right distal ear canal.  Bilateral nasal congestion without drainage.  Eyes: Conjunctivae are normal.  Cardiovascular: Normal rate and normal heart sounds.  Pulmonary/Chest: Effort normal. No respiratory distress. She has no wheezes. She has no rales.  Musculoskeletal: Normal range of motion.  Neurological: She is alert and oriented to person, place, and time.  Skin: Skin is warm and dry. No rash noted.  Psychiatric: She has a normal mood and affect.     ED Treatments / Results  Labs (all labs ordered are listed, but only abnormal results are displayed) Labs Reviewed - No data to display  EKG  EKG Interpretation None       Radiology No results found.  Procedures Procedures (including critical care time)  Medications Ordered in ED Medications - No data to display   Initial Impression / Assessment and  Plan / ED Course  I have reviewed the triage vital signs and the nursing notes.  Pertinent labs & imaging results that were available during my care of the patient were reviewed by me and considered in my medical decision making (see chart for details).     Discuss various home remedies for help and improve symptoms of nasal congestion.  She was prescribed Sudafed for this symptom.  She has no symptoms or exam findings to suggest bacterial sinusitis or ear infection.  Advised as needed follow-up for any persistent or worsening symptoms.  Final Clinical Impressions(s) / ED Diagnoses   Final diagnoses:  Otalgia of both ears  Dysfunction of both eustachian tubes  Nasal congestion    ED Discharge Orders        Ordered    pseudoephedrine (SUDAFED) 60 MG  tablet  Every 6 hours PRN     04/15/17 1948       Burgess Amor, Cordelia Poche 04/16/17 1337    Loren Racer, MD 04/27/17 1213

## 2018-02-01 ENCOUNTER — Emergency Department (HOSPITAL_COMMUNITY)
Admission: EM | Admit: 2018-02-01 | Discharge: 2018-02-01 | Disposition: A | Discharge status: Home / Self Care | Payer: Medicaid Other | Attending: Emergency Medicine | Admitting: Emergency Medicine

## 2018-02-01 ENCOUNTER — Other Ambulatory Visit: Payer: Self-pay

## 2018-02-01 ENCOUNTER — Encounter (HOSPITAL_COMMUNITY): Payer: Self-pay | Admitting: Emergency Medicine

## 2018-02-01 DIAGNOSIS — H10023 Other mucopurulent conjunctivitis, bilateral: Secondary | ICD-10-CM | POA: Insufficient documentation

## 2018-02-01 DIAGNOSIS — Z79899 Other long term (current) drug therapy: Secondary | ICD-10-CM | POA: Insufficient documentation

## 2018-02-01 DIAGNOSIS — R51 Headache: Secondary | ICD-10-CM | POA: Insufficient documentation

## 2018-02-01 DIAGNOSIS — J45909 Unspecified asthma, uncomplicated: Secondary | ICD-10-CM | POA: Insufficient documentation

## 2018-02-01 DIAGNOSIS — F17210 Nicotine dependence, cigarettes, uncomplicated: Secondary | ICD-10-CM | POA: Insufficient documentation

## 2018-02-01 MED ORDER — KETOROLAC TROMETHAMINE 0.4 % OP SOLN: 1.0000 [drp] | Freq: Four times a day (QID) | OPHTHALMIC | 0 refills | Status: DC

## 2018-02-01 MED ORDER — TOBRAMYCIN 0.3 % OP SOLN
2.0000 [drp] | Freq: Once | OPHTHALMIC | Status: AC
  Administered 2018-02-01: 2 [drp] via OPHTHALMIC
  Filled 2018-02-01: qty 5

## 2018-02-01 NOTE — ED Provider Notes (Signed)
[facility] EMERGENCY DEPARTMENT Provider Note   CSN: [id number] Arrival date & time: 02/01/18  1012     History   Chief Complaint Chief Complaint  Patient presents with  . Eye Pain    HPI Patricia Williamson is a 32 y.o. female.  The history is provided by the patient.  Eye Pain  This is a new problem. The current episode started more than 1 week ago. The problem occurs daily. The problem has been gradually worsening. Associated symptoms include headaches. Pertinent negatives include no chest pain, no abdominal pain and no shortness of breath. Exacerbated by: bright lights. Nothing relieves the symptoms. She has tried nothing for the symptoms.    Past Medical History:  Diagnosis Date  . Asthma   . Carpal tunnel syndrome   . Gall stones   . Hx of constipation   . Nexplanon insertion 10/11/2013   Inserted left arm 10/11/13 remove 5/27 /18    Patient Active Problem List   Diagnosis Date Noted  . Breakthrough bleeding on Nexplanon 05/06/2016  . Bloody stools 07/25/2015  . Nexplanon insertion 10/11/2013  . Abnormal uterine bleeding (AUB) 08/03/2013  . Asthma exacerbation 02/22/2013  . Bronchitis with asthma, acute 02/22/2013    Past Surgical History:  Procedure Laterality Date  . CHOLECYSTECTOMY N/A 04/06/2014   Procedure: LAPAROSCOPIC CHOLECYSTECTOMY;  Surgeon: Dalia HeadingMark A Jenkins, MD;  Location: [facility];  Service: General;  Laterality: N/A;  . CYSTOSCOPY W/ URETERAL STENT PLACEMENT       OB History    Gravida  2   Para  2   Term  1   Preterm  1   AB      Living  2     SAB      TAB      Ectopic      Multiple      Live Births  2            Home Medications    Prior to Admission medications   Medication Sig Start Date End Date Taking? Authorizing Provider  acetaminophen (TYLENOL) 500 MG tablet Take 1,000 mg by mouth every 6 (six) hours as needed for pain (For headache.).    [provider]  albuterol (PROVENTIL HFA;VENTOLIN HFA) 108 (90  BASE) MCG/ACT inhaler Inhale 2 puffs into the lungs every 6 (six) hours as needed for wheezing. Patient not taking: Reported on 10/13/2016 02/22/13   Tilda BurrowFerguson, John V, MD  etonogestrel (NEXPLANON) 68 MG IMPL implant Inject 1 each into the skin once.    [provider]  HYDROcodone-acetaminophen (NORCO/VICODIN) 5-325 MG tablet Take one tab po q 4-6 hrs prn pain Patient not taking: Reported on 10/13/2016 09/11/16   Triplett, Tammy, PA-C  ibuprofen (ADVIL,MOTRIN) 800 MG tablet Take 1 tablet (800 mg total) by mouth 3 (three) times daily. 09/11/16   Triplett, Tammy, PA-C  pseudoephedrine (SUDAFED) 60 MG tablet Take 1 tablet (60 mg total) by mouth every 6 (six) hours as needed for congestion. 04/15/17   Burgess AmorIdol, Julie, PA-C    Family History Family History  Problem Relation Age of Onset  . Hypertension Mother   . Diabetes Mother   . Hyperlipidemia Mother   . Hypertension Brother   . Diabetes Maternal Grandmother   . Heart disease Maternal Grandmother   . Other Paternal Aunt        benign breast lump    Social History Social History   Tobacco Use  . Smoking status: Current Every Day Smoker  Packs/day: 0.25    Years: 4.00    Pack years: 1.00    Types: Cigarettes  . Smokeless tobacco: Never Used  Substance Use Topics  . Alcohol use: No  . Drug use: No     Allergies   Vinegar [acetic acid] and Sulfa antibiotics   Review of Systems Review of Systems  Constitutional: Negative for activity change.       All ROS Neg except as noted in HPI  HENT: Negative for nosebleeds.   Eyes: Positive for photophobia, pain and redness. Negative for discharge.  Respiratory: Negative for cough, shortness of breath and wheezing.   Cardiovascular: Negative for chest pain and palpitations.  Gastrointestinal: Negative for abdominal pain and blood in stool.  Genitourinary: Negative for dysuria, frequency and hematuria.  Musculoskeletal: Negative for arthralgias, back pain and neck pain.  Skin:  Negative.   Neurological: Positive for headaches. Negative for dizziness, seizures and speech difficulty.  Psychiatric/Behavioral: Negative for confusion and hallucinations.     Physical Exam Updated Vital Signs BP 132/60 (BP Location: Right Arm)   Pulse 91   Temp 98.1 F (36.7 C) (Oral)   Resp 18   Ht 5\' 1"  (1.549 m)   Wt 85.7 kg   SpO2 98%   BMI 35.71 kg/m   Physical Exam  Eyes: Pupils are equal, round, and reactive to light. EOM are normal. Right eye exhibits no hordeolum. No foreign body present in the right eye. Left eye exhibits no hordeolum. No foreign body present in the left eye. Right conjunctiva is injected. Right conjunctiva has no hemorrhage. Left conjunctiva is injected. Left conjunctiva has no hemorrhage. No scleral icterus.  Fundoscopic exam:      The right eye shows no AV nicking, no exudate, no hemorrhage and no papilledema.       The left eye shows no AV nicking, no exudate, no hemorrhage and no papilledema.  Slit lamp exam:      The right eye shows no corneal ulcer, no foreign body and no hyphema.       The left eye shows no corneal ulcer, no foreign body and no hyphema.     ED Treatments / Results  Labs (all labs ordered are listed, but only abnormal results are displayed) Labs Reviewed - No data to display  EKG None  Radiology No results found.  Procedures Procedures (including critical care time)  Medications Ordered in ED Medications  tobramycin (TOBREX) 0.3 % ophthalmic solution 2 drop (has no administration in time range)     Initial Impression / Assessment and Plan / ED Course  I have reviewed the triage vital signs and the nursing notes.  Pertinent labs & imaging results that were available during my care of the patient were reviewed by me and considered in my medical decision making (see chart for details).       Final Clinical Impressions(s) / ED Diagnoses MDM  Vital signs are within normal limits.  Pulse oximetry is 98% on  room air.  Within normal limits by my interpretation.  The examination favors bilateral conjunctivitis.  Patient will be treated with tobramycin, and cool compresses.  The patient is to see the primary physician or return to the emergency department if not improving.  Patient is in agreement with this plan.  We discussed the contagious nature of this problem.  We discussed good handwashing, and wiping down surfaces.   Final diagnoses:  Other mucopurulent conjunctivitis of both eyes    ED Discharge Orders  Ordered    ketorolac (ACULAR) 0.4 % SOLN  4 times daily     02/01/18 1243           Ivery Quale, PA-C 02/02/18 1646    Vanetta Mulders, MD 02/02/18 1725

## 2018-02-01 NOTE — Discharge Instructions (Signed)
Your examination suggests conjunctivitis involving both eyes.  This is highly contagious.  Please wash hands frequently.  Please wipe down surfaces.  Please have all members of the home to wash hands frequently.  Please use 2 drops of tobramycin to both eyes every 4 hours over the next 5 days.  Please use 1 drop of accurately air 4 times daily for inflammation and discomfort.  Please see your eye specialist for additional evaluation if not improving, or return to the emergency department for any emergent changes.

## 2018-02-01 NOTE — ED Triage Notes (Signed)
Pt c/o of bilateral eye pain, burning, drainage, and blurred vision x 1 week.

## 2018-06-14 ENCOUNTER — Ambulatory Visit: Payer: Medicaid Other | Admitting: Women's Health

## 2018-06-23 ENCOUNTER — Other Ambulatory Visit (HOSPITAL_COMMUNITY)
Admission: RE | Admit: 2018-06-23 | Discharge: 2018-06-23 | Disposition: A | Discharge status: Home / Self Care | Payer: Medicaid Other | Source: Ambulatory Visit | Attending: Obstetrics and Gynecology | Admitting: Obstetrics and Gynecology

## 2018-06-23 ENCOUNTER — Encounter: Payer: Self-pay | Admitting: Women's Health

## 2018-06-23 ENCOUNTER — Ambulatory Visit (INDEPENDENT_AMBULATORY_CARE_PROVIDER_SITE_OTHER): Payer: Medicaid Other | Admitting: Women's Health

## 2018-06-23 ENCOUNTER — Other Ambulatory Visit: Payer: Self-pay

## 2018-06-23 VITALS — BP 134/87 | HR 79 | Ht 60.0 in | Wt 198.0 lb

## 2018-06-23 DIAGNOSIS — N921 Excessive and frequent menstruation with irregular cycle: Secondary | ICD-10-CM

## 2018-06-23 DIAGNOSIS — R102 Pelvic and perineal pain: Secondary | ICD-10-CM

## 2018-06-23 DIAGNOSIS — Z113 Encounter for screening for infections with a predominantly sexual mode of transmission: Secondary | ICD-10-CM

## 2018-06-23 DIAGNOSIS — Z975 Presence of (intrauterine) contraceptive device: Secondary | ICD-10-CM | POA: Diagnosis not present

## 2018-06-23 DIAGNOSIS — Z3202 Encounter for pregnancy test, result negative: Secondary | ICD-10-CM | POA: Diagnosis not present

## 2018-06-23 DIAGNOSIS — Z3009 Encounter for other general counseling and advice on contraception: Secondary | ICD-10-CM | POA: Insufficient documentation

## 2018-06-23 DIAGNOSIS — N941 Unspecified dyspareunia: Secondary | ICD-10-CM

## 2018-06-23 DIAGNOSIS — Z309 Encounter for contraceptive management, unspecified: Secondary | ICD-10-CM | POA: Diagnosis not present

## 2018-06-23 DIAGNOSIS — Z01419 Encounter for gynecological examination (general) (routine) without abnormal findings: Secondary | ICD-10-CM

## 2018-06-23 DIAGNOSIS — R4586 Emotional lability: Secondary | ICD-10-CM

## 2018-06-23 LAB — POCT URINE PREGNANCY: Preg Test, Ur: NEGATIVE

## 2018-06-23 NOTE — Progress Notes (Signed)
WELL-WOMAN EXAMINATION Patient name: Patricia Williamson MRN [id number]  Date of birth: 1985/08/25 Chief Complaint:   Gynecologic Exam (cant find nexplanon in arm)  History of Present Illness:   Patricia Williamson is a 33 y.o. [id number] Caucasian female being [facility] today [facility].  Current complaints: mood swings-gets easily irritated, irregular bleeding, weird brief lump Lt side umbilicus, pain w/ sex x ~981mth, can't find Nexplanon in her arm. Feels like she's pregnant, has taken 4 HPT that were all neg. Denies dep/anx. Denies abnormal discharge, itching/odor/irritation.  Is interested in BTL/getting Nexplanon removed.  PCP: none      does not desire labs, other than FP [facility] STD screen No LMP recorded. Patient has had an implant. The current method of family planning is Nexplanon 10/13/16 Last pap 07/25/15. Results were: normal Last mammogram: never. Results were: n/a. Family h/o breast cancer: Yes, MGA Last colonoscopy: never. Results were: n/a. Family h/o colorectal cancer: Yes PGF Review of Systems:   Pertinent items are noted in HPI Denies any headaches, blurred vision, fatigue, shortness of breath, chest pain, abdominal pain, abnormal vaginal discharge/itching/odor/irritation, problems with periods, bowel movements, urination, or intercourse unless otherwise stated above. Pertinent History Reviewed:  Reviewed past medical,surgical, social and family history.  Reviewed problem list, medications and allergies. Physical Assessment:   Vitals:   06/23/18 1358  BP: 134/87  Pulse: 79  Weight: 198 lb (89.8 kg)  Height: 5' (1.524 m)  Body mass index is 38.67 kg/m.        Physical Examination:   General appearance - well appearing, and in no distress  Mental status - alert, oriented to person, place, and time  Psych:  She has a normal mood and affect  Skin - warm and dry, normal color, no suspicious lesions noted  Chest - effort normal, all lung fields clear to  auscultation bilaterally  Heart - normal rate and regular rhythm  Neck:  midline trachea, no thyromegaly or nodules  Breasts - breasts appear normal, no suspicious masses, no skin or nipple changes or  axillary nodes  Abdomen - soft, nontender, nondistended, no masses or organomegaly  Pelvic - VULVA: normal appearing vulva with no masses, tenderness or lesions  VAGINA: normal appearing vagina with normal color and discharge, no lesions  CERVIX: normal appearing cervix without discharge or lesions, no CMT  Thin prep pap is done w/ HR HPV cotesting  UTERUS: uterus is felt to be normal size, shape, consistency, +tenderness  ADNEXA: No adnexal masses +tenderness bilaterally.  Extremities:  No swelling or varicosities noted; Nexplanon palpated Lt arm, just feels a little deep  Results for orders placed or performed in visit on 06/23/18 (from the past 24 hour(s))  POCT urine pregnancy   Collection Time: 06/23/18  3:09 PM  Result Value Ref Range   Preg Test, Ur Negative Negative    Assessment & Plan:  1) Well-Woman Exam  2) Mood swings, irregular bleeding, dyspareunia, pelvic pain> x ~341mth, feels like all r/t Nexplanon, interested in BTL and Nexplanon removal  3) Pelvic pain/dyspareunia> recommend pelvic u/s, FP [facility] doesn't cover, pt requested note to take to try to get regular [facility] stating what she needed- note given  4) STD screen  Labs/procedures today: pap w/ gc/ct, hiv, rpr  Mammogram @31yo  or sooner if problems Colonoscopy @33yo  or sooner if problems  Orders Placed This Encounter  Procedures  . RPR  . HIV Antibody (routine testing w rflx)  . POCT urine pregnancy  Follow-up: Return for F/U w/ J[facility] to discuss BTL and Nexplanon removal.  Cheral Marker CNM, WHNP-BC 06/23/2018 3:13 PM

## 2018-06-24 LAB — HIV ANTIBODY (ROUTINE TESTING W REFLEX): HIV SCREEN 4TH GENERATION: NONREACTIVE

## 2018-06-24 LAB — RPR: RPR: NONREACTIVE

## 2018-06-27 LAB — CYTOLOGY - PAP
CHLAMYDIA, DNA PROBE: NEGATIVE
DIAGNOSIS: NEGATIVE
HPV (WINDOPATH): NOT DETECTED
Neisseria Gonorrhea: NEGATIVE

## 2018-06-29 ENCOUNTER — Other Ambulatory Visit: Payer: Self-pay | Admitting: Obstetrics and Gynecology

## 2018-06-29 ENCOUNTER — Emergency Department (HOSPITAL_COMMUNITY)
Admission: EM | Admit: 2018-06-29 | Discharge: 2018-06-29 | Disposition: A | Discharge status: Home / Self Care | Payer: Medicaid Other | Attending: Emergency Medicine | Admitting: Emergency Medicine

## 2018-06-29 ENCOUNTER — Other Ambulatory Visit: Payer: Self-pay

## 2018-06-29 ENCOUNTER — Encounter (HOSPITAL_COMMUNITY): Payer: Self-pay

## 2018-06-29 DIAGNOSIS — Z79899 Other long term (current) drug therapy: Secondary | ICD-10-CM | POA: Insufficient documentation

## 2018-06-29 DIAGNOSIS — R51 Headache: Secondary | ICD-10-CM | POA: Insufficient documentation

## 2018-06-29 DIAGNOSIS — R110 Nausea: Secondary | ICD-10-CM | POA: Insufficient documentation

## 2018-06-29 DIAGNOSIS — Z3202 Encounter for pregnancy test, result negative: Secondary | ICD-10-CM | POA: Insufficient documentation

## 2018-06-29 DIAGNOSIS — R197 Diarrhea, unspecified: Secondary | ICD-10-CM | POA: Insufficient documentation

## 2018-06-29 DIAGNOSIS — J45909 Unspecified asthma, uncomplicated: Secondary | ICD-10-CM | POA: Insufficient documentation

## 2018-06-29 DIAGNOSIS — F17210 Nicotine dependence, cigarettes, uncomplicated: Secondary | ICD-10-CM | POA: Insufficient documentation

## 2018-06-29 LAB — CBC WITH DIFFERENTIAL/PLATELET
ABS IMMATURE GRANULOCYTES: 0.05 10*3/uL (ref 0.00–0.07)
Basophils Absolute: 0.1 10*3/uL (ref 0.0–0.1)
Basophils Relative: 1 %
Eosinophils Absolute: 0.1 10*3/uL (ref 0.0–0.5)
Eosinophils Relative: 1 %
HEMATOCRIT: 47.8 % — AB (ref 36.0–46.0)
Hemoglobin: 15.6 g/dL — ABNORMAL HIGH (ref 12.0–15.0)
IMMATURE GRANULOCYTES: 1 %
LYMPHS ABS: 1.2 10*3/uL (ref 0.7–4.0)
Lymphocytes Relative: 12 %
MCH: 29.3 pg (ref 26.0–34.0)
MCHC: 32.6 g/dL (ref 30.0–36.0)
MCV: 89.7 fL (ref 80.0–100.0)
MONOS PCT: 5 %
Monocytes Absolute: 0.5 10*3/uL (ref 0.1–1.0)
NEUTROS ABS: 8.3 10*3/uL — AB (ref 1.7–7.7)
Neutrophils Relative %: 80 %
Platelets: 220 10*3/uL (ref 150–400)
RBC: 5.33 MIL/uL — ABNORMAL HIGH (ref 3.87–5.11)
RDW: 12.7 % (ref 11.5–15.5)
WBC: 10.3 10*3/uL (ref 4.0–10.5)
nRBC: 0 % (ref 0.0–0.2)

## 2018-06-29 LAB — COMPREHENSIVE METABOLIC PANEL
ALK PHOS: 47 U/L (ref 38–126)
ALT: 18 U/L (ref 0–44)
AST: 17 U/L (ref 15–41)
Albumin: 3.8 g/dL (ref 3.5–5.0)
Anion gap: 10 (ref 5–15)
BUN: 11 mg/dL (ref 6–20)
CHLORIDE: 106 mmol/L (ref 98–111)
CO2: 21 mmol/L — ABNORMAL LOW (ref 22–32)
Calcium: 8.8 mg/dL — ABNORMAL LOW (ref 8.9–10.3)
Creatinine, Ser: 0.57 mg/dL (ref 0.44–1.00)
GFR calc Af Amer: >60 mL/min (ref >60)
GFR calc non Af Amer: >60 mL/min (ref >60)
GLUCOSE: 103 mg/dL — AB (ref 70–99)
Potassium: 3.9 mmol/L (ref 3.5–5.1)
SODIUM: 137 mmol/L (ref 135–145)
TOTAL PROTEIN: 6.9 g/dL (ref 6.5–8.1)
Total Bilirubin: 0.6 mg/dL (ref 0.3–1.2)

## 2018-06-29 LAB — URINALYSIS, ROUTINE W REFLEX MICROSCOPIC
Bilirubin Urine: NEGATIVE
Glucose, UA: NEGATIVE mg/dL
KETONES UR: NEGATIVE mg/dL
LEUKOCYTE UA: NEGATIVE
NITRITE: NEGATIVE
PH: 5 (ref 5.0–8.0)
Protein, ur: NEGATIVE mg/dL
SPECIFIC GRAVITY, URINE: 1.02 (ref 1.005–1.030)

## 2018-06-29 LAB — PREGNANCY, URINE: PREG TEST UR: NEGATIVE

## 2018-06-29 LAB — LIPASE, BLOOD: Lipase: 18 U/L (ref 11–51)

## 2018-06-29 MED ORDER — SODIUM CHLORIDE 0.9 % IV BOLUS
1000.0000 mL | Freq: Once | INTRAVENOUS | Status: AC
  Administered 2018-06-29: 1000 mL via INTRAVENOUS

## 2018-06-29 MED ORDER — ACETAMINOPHEN 325 MG PO TABS
650.0000 mg | ORAL_TABLET | Freq: Once | ORAL | Status: AC
  Administered 2018-06-29: 650 mg via ORAL
  Filled 2018-06-29: qty 2

## 2018-06-29 MED ORDER — ONDANSETRON HCL 4 MG PO TABS: 4.0000 mg | ORAL_TABLET | Freq: Four times a day (QID) | ORAL | 0 refills | Status: DC

## 2018-06-29 MED ORDER — ONDANSETRON HCL 4 MG/2ML IJ SOLN
4.0000 mg | Freq: Once | INTRAMUSCULAR | Status: AC
  Administered 2018-06-29: 4 mg via INTRAVENOUS
  Filled 2018-06-29: qty 2

## 2018-06-29 MED ORDER — KETOROLAC TROMETHAMINE 30 MG/ML IJ SOLN
30.0000 mg | Freq: Once | INTRAMUSCULAR | Status: AC
  Administered 2018-06-29: 30 mg via INTRAVENOUS
  Filled 2018-06-29: qty 1

## 2018-06-29 NOTE — ED Triage Notes (Signed)
Pt c/o nausea for past few days.  Reports started having diarrhea and headache yesterday.  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

## 2018-06-29 NOTE — Discharge Instructions (Addendum)
You may take the Zofran as directed if needed for nausea or vomiting.  Small, frequent sips of fluids today and bland diet as tolerated.  You may find that bananas, rice, applesauce, and toast may help slow up the diarrhea.  Follow-up with your primary doctor for recheck return to the ER for any worsening symptoms.

## 2018-06-29 NOTE — ED Provider Notes (Signed)
[facility] Provider Note   CSN: [id number] Arrival date & time: 06/29/18  0746     History   Chief Complaint Chief Complaint  Patient presents with  . Nausea  . Diarrhea    HPI Patricia Williamson is a 33 y.o. female.  HPI   Patricia Williamson is a 33 y.o. female who presents to the Emergency Department complaining of nausea and diarrhea for 3 days.  Symptoms have also been associated with a headache that developed after several episodes of diarrhea.  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  She reports multiple episodes of watery to loose stools that are normal in color.  She also describes intermittent, sharp stabbing pains to her upper abdomen prior to bowel movements.  Pain does not radiate and is not constant.  Describes headache as throbbing from her temples and across her forehead.  No neck pain or stiffness. She denies recent antibiotics, vomiting, fever, chills, body aches or cough.  No dysuria.  She has been tolerating small amounts of fluids.   Past Medical History:  Diagnosis Date  . Asthma   . Carpal tunnel syndrome   . Gall stones   . Hx of constipation   . Nexplanon insertion 10/11/2013   Inserted left arm 10/11/13 remove 5/27 /18    Patient Active Problem List   Diagnosis Date Noted  . Breakthrough bleeding on Nexplanon 05/06/2016  . Bloody stools 07/25/2015  . Nexplanon insertion 10/11/2013  . Abnormal uterine bleeding (AUB) 08/03/2013  . Asthma exacerbation 02/22/2013  . Bronchitis with asthma, acute 02/22/2013    Past Surgical History:  Procedure Laterality Date  . CHOLECYSTECTOMY N/A 04/06/2014   Procedure: LAPAROSCOPIC CHOLECYSTECTOMY;  Surgeon: Dalia HeadingMark A Jenkins, MD;  Location: [facility];  Service: General;  Laterality: N/A;  . CYSTOSCOPY W/ URETERAL STENT PLACEMENT       OB History    Gravida  2   Para  2   Term  1   Preterm  1   AB      Living  2     SAB      TAB        Ectopic      Multiple      Live Births  2            Home Medications    Prior to Admission medications   Medication Sig Start Date End Date Taking? Authorizing Provider  acetaminophen (TYLENOL) 500 MG tablet Take 1,000 mg by mouth every 6 (six) hours as needed for pain (For headache.).    [provider]  etonogestrel (NEXPLANON) 68 MG IMPL implant Inject 1 each into the skin once.    [provider]  ibuprofen (ADVIL,MOTRIN) 800 MG tablet Take 1 tablet (800 mg total) by mouth 3 (three) times daily. 09/11/16   Pauline Ausriplett, Puneet Masoner, PA-C    Family History Family History  Problem Relation Age of Onset  . Hypertension Mother   . Diabetes Mother   . Hyperlipidemia Mother   . Hypertension Brother   . Diabetes Maternal Grandmother   . Heart disease Maternal Grandmother   . Other Paternal Aunt        benign breast lump    Social History Social History   Tobacco Use  . Smoking status: Current Every Day Smoker    Packs/day: 0.25    Years: 4.00    Pack years: 1.00    Types:  Cigarettes  . Smokeless tobacco: Never Used  Substance Use Topics  . Alcohol use: No  . Drug use: No     Allergies   Vinegar [acetic acid] and Sulfa antibiotics   Review of Systems Review of Systems  Constitutional: Positive for appetite change. Negative for chills and fever.  Respiratory: Negative for shortness of breath.   Cardiovascular: Negative for chest pain and leg swelling.  Gastrointestinal: Positive for abdominal pain, diarrhea and nausea. Negative for abdominal distention, blood in stool and vomiting.  Genitourinary: Negative for decreased urine volume, difficulty urinating, dysuria and flank pain.  Musculoskeletal: Negative for back pain.  Skin: Negative for color change and rash.  Neurological: Positive for headaches. Negative for dizziness, weakness and numbness.  Hematological: Negative for adenopathy.  Psychiatric/Behavioral: Negative for confusion.      Physical Exam Updated Vital Signs BP 127/71 (BP Location: Right Arm)   Pulse 94   Temp 97.9 F (36.6 C) (Oral)   Resp 20   Ht 5' (1.524 m)   Wt 89.8 kg   SpO2 98%   BMI 38.67 kg/m   Physical Exam Vitals signs and nursing note reviewed.  Constitutional:      General: She is not in acute distress.    Appearance: Normal appearance. She is not ill-appearing.  HENT:     Head: Normocephalic.     Right Ear: Tympanic membrane and ear canal normal.     Left Ear: Tympanic membrane and ear canal normal.     Nose: No congestion or rhinorrhea.     Mouth/Throat:     Mouth: Mucous membranes are moist.     Pharynx: No oropharyngeal exudate or posterior oropharyngeal erythema.  Neck:     Musculoskeletal: Normal range of motion. No neck rigidity.  Cardiovascular:     Rate and Rhythm: Normal rate and regular rhythm.     Pulses: Normal pulses.  Pulmonary:     Effort: Pulmonary effort is normal.     Breath sounds: Normal breath sounds.  Abdominal:     General: There is no distension.     Palpations: Abdomen is soft.     Tenderness: There is no abdominal tenderness. There is no guarding.  Musculoskeletal: Normal range of motion.  Lymphadenopathy:     Cervical: No cervical adenopathy.  Skin:    General: Skin is warm.     Capillary Refill: Capillary refill takes less than 2 seconds.     Findings: No rash.  Neurological:     General: No focal deficit present.     Mental Status: She is alert.      ED Treatments / Results  Labs (all labs ordered are listed, but only abnormal results are displayed) Labs Reviewed  URINALYSIS, ROUTINE W REFLEX MICROSCOPIC - Abnormal; Notable for the following components:      Result Value   APPearance HAZY (*)    Hgb urine dipstick MODERATE (*)    Bacteria, UA RARE (*)    All other components within normal limits  COMPREHENSIVE METABOLIC PANEL - Abnormal; Notable for the following components:   CO2 21 (*)    Glucose, Bld 103 (*)    Calcium  8.8 (*)    All other components within normal limits  CBC WITH DIFFERENTIAL/PLATELET - Abnormal; Notable for the following components:   RBC 5.33 (*)    Hemoglobin 15.6 (*)    HCT 47.8 (*)    Neutro Abs 8.3 (*)    All other components within normal limits  PREGNANCY,  URINE  LIPASE, BLOOD  CBC WITH DIFFERENTIAL/PLATELET    EKG None  Radiology No results found.  Procedures Procedures (including critical care time)  Medications Ordered in ED Medications  sodium chloride 0.9 % bolus 1,000 mL (0 mLs Intravenous Stopped 06/29/18 1104)  ondansetron (ZOFRAN) injection 4 mg (4 mg Intravenous Given 06/29/18 0958)  acetaminophen (TYLENOL) tablet 650 mg (650 mg Oral Given 06/29/18 1000)  ketorolac (TORADOL) 30 MG/ML injection 30 mg (30 mg Intravenous Given 06/29/18 1136)     Initial Impression / Assessment and Plan / ED Course  I have reviewed the triage vital signs and the nursing notes.  Pertinent labs & imaging results that were available during my care of the patient were reviewed by me and considered in my medical decision making (see chart for details).     1045 on recheck, patient reports still having headache but nausea has improved. Headache is frontal and she is without nuchal rigidity,  No diarrhea since ER arrival.  She is resting comfortably.  1140  Pt given IV toradol.  States she is ready for d/c home and requesting a work note.  Sx's likely viral and work and family members also with similar sx's.  Doubt acute abdomen, sx's felt to be viral.  Pt agrees to small amts of fluids, bland diet.  Strict return precautions discussed.     Final Clinical Impressions(s) / ED Diagnoses   Final diagnoses:  Nausea  Diarrhea, unspecified type    ED Discharge Orders    None       Rosey Bath 06/29/18 2109    Vanetta Mulders, MD 07/05/18 [phone]

## 2018-07-11 ENCOUNTER — Encounter: Payer: Self-pay | Admitting: Obstetrics and Gynecology

## 2018-07-11 ENCOUNTER — Ambulatory Visit (INDEPENDENT_AMBULATORY_CARE_PROVIDER_SITE_OTHER): Payer: Medicaid Other | Admitting: Obstetrics and Gynecology

## 2018-07-11 VITALS — BP 126/72 | HR 74 | Ht 60.0 in | Wt 198.0 lb

## 2018-07-11 DIAGNOSIS — R5382 Chronic fatigue, unspecified: Secondary | ICD-10-CM

## 2018-07-11 DIAGNOSIS — N926 Irregular menstruation, unspecified: Secondary | ICD-10-CM

## 2018-07-11 NOTE — Progress Notes (Addendum)
Patient ID: SIMMIE BALTHAZOR, female   DOB: [birth year], 33 y.o.   MRN: [id number]    [facility] Clinic Visit  @DATE @            Patient name: SHAGUANA CHAI MRN [id number]  Date of birth: 1985/08/29  CC & HPI:  CHARMEL AHOLA is a 33 y.o. female presenting today for BTL discussion. Planned on having tubes tied after 2nd child was bore and her gall bladder was going bad at that time. Was told to wait 6 month after delivery of 2nd child but never made appt to discuss and schedule surgery  Believes body is reacting negatively to Nexplanon. Does not ovulate when implant is removed. She had 1 single episode of intense bleeding. The only time she has had similar bleeding when she removed the implant to conceive. Is constantly fatigued, notes that she can sleep for 3 days straight with no increase in appetite & weight gain. Weight before was 189 lbs then increased to 198. No knowledge of family hx of low thyroid function.   Works as a Advertising copywriter and is need significant documentation of how many days of works she will due to recovery. 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    Pertinent History Reviewed:   Reviewed: Significant for  Medical         Past Medical History:  Diagnosis Date  . Asthma   . Carpal tunnel syndrome   . Gall stones   . Hx of constipation   . Nexplanon insertion 10/11/2013   Inserted left arm 10/11/13 remove 5/27 /18                              Surgical Hx:    Past Surgical History:  Procedure Laterality Date  . CHOLECYSTECTOMY N/A 04/06/2014   Procedure: LAPAROSCOPIC CHOLECYSTECTOMY;  Surgeon: Dalia Heading, MD;  Location: [facility];  Service: General;  Laterality: N/A;  . CYSTOSCOPY W/ URETERAL STENT PLACEMENT     Medications: Reviewed & Updated - see associated section                        Current Outpatient Medications:  .  etonogestrel (NEXPLANON) 68 MG IMPL implant, Inject 1 each into the skin once., Disp: , Rfl:    Social History: Reviewed -  reports that she has been smoking cigarettes. She has a 1.00 pack-year smoking history. She has never used smokeless tobacco.  Objective Findings:  Vitals: There were no vitals taken for this visit.  PHYSICAL EXAMINATION General appearance - alert, well appearing, and in no distress and oriented to person, place, and time Mental status - alert, oriented to person, place, and time, normal mood, behavior, speech, dress, motor activity, and thought processes, affect appropriate to mood Extremity:"  Left arm has Nexplanon in appropriate spot, is somewhat deep[  PELVIC Not examined  Assessment & Plan:   A:  1. Fatigue 2. 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 CBC, CMP, TSH 2. Sign tubal  consent form 3. 3-4 weeks pre-op   By signing my name below, I, Arnette Norris, attest that this documentation has been prepared under the direction and in the presence of Tilda Burrow, MD. Electronically Signed:  [employer]. 07/11/18. 9:09 AM.  I personally performed the services described in this documentation, which was SCRIBED in my presence. The recorded information has been reviewed and considered accurate. It has been edited as necessary during review. Tilda Burrow, MD

## 2018-07-12 LAB — CBC
HEMATOCRIT: 49.8 % — AB (ref 34.0–46.6)
Hemoglobin: 17 g/dL — ABNORMAL HIGH (ref 11.1–15.9)
MCH: 30.5 pg (ref 26.6–33.0)
MCHC: 34.1 g/dL (ref 31.5–35.7)
MCV: 89 fL (ref 79–97)
Platelets: 290 10*3/uL (ref 150–450)
RBC: 5.58 x10E6/uL — ABNORMAL HIGH (ref 3.77–5.28)
RDW: 12.4 % (ref 11.7–15.4)
WBC: 9.6 10*3/uL (ref 3.4–10.8)

## 2018-07-12 LAB — COMPREHENSIVE METABOLIC PANEL
ALBUMIN: 4.5 g/dL (ref 3.9–5.0)
ALT: 14 IU/L (ref 0–32)
AST: 16 IU/L (ref 0–40)
Albumin/Globulin Ratio: 1.7 (ref 1.2–2.2)
Alkaline Phosphatase: 68 IU/L (ref 39–117)
BUN / CREAT RATIO: 11 (ref 9–23)
BUN: 8 mg/dL (ref 6–20)
Bilirubin Total: 0.2 mg/dL (ref 0.0–1.2)
CALCIUM: 9.7 mg/dL (ref 8.7–10.2)
CO2: 20 mmol/L (ref 20–29)
CREATININE: 0.71 mg/dL (ref 0.57–1.00)
Chloride: 104 mmol/L (ref 96–106)
GFR calc Af Amer: 132 mL/min/{1.73_m2} (ref >59)
GFR, EST NON AFRICAN AMERICAN: 115 mL/min/{1.73_m2} (ref >59)
GLOBULIN, TOTAL: 2.6 g/dL (ref 1.5–4.5)
Glucose: 86 mg/dL (ref 65–99)
Potassium: 4.9 mmol/L (ref 3.5–5.2)
SODIUM: 142 mmol/L (ref 134–144)
Total Protein: 7.1 g/dL (ref 6.0–8.5)

## 2018-07-12 LAB — TSH: TSH: 1.52 u[IU]/mL (ref 0.450–4.500)

## 2018-08-08 ENCOUNTER — Encounter: Payer: Self-pay | Admitting: Obstetrics and Gynecology

## 2018-09-04 ENCOUNTER — Other Ambulatory Visit: Payer: Self-pay

## 2018-09-04 ENCOUNTER — Encounter (HOSPITAL_COMMUNITY): Payer: Self-pay | Admitting: Emergency Medicine

## 2018-09-04 ENCOUNTER — Emergency Department (HOSPITAL_COMMUNITY)
Admission: EM | Admit: 2018-09-04 | Discharge: 2018-09-04 | Disposition: A | Discharge status: Home / Self Care | Payer: Medicaid Other | Attending: Emergency Medicine | Admitting: Emergency Medicine

## 2018-09-04 DIAGNOSIS — J019 Acute sinusitis, unspecified: Secondary | ICD-10-CM | POA: Insufficient documentation

## 2018-09-04 DIAGNOSIS — J45909 Unspecified asthma, uncomplicated: Secondary | ICD-10-CM | POA: Insufficient documentation

## 2018-09-04 DIAGNOSIS — Z720 Tobacco use: Secondary | ICD-10-CM

## 2018-09-04 DIAGNOSIS — J40 Bronchitis, not specified as acute or chronic: Secondary | ICD-10-CM | POA: Insufficient documentation

## 2018-09-04 DIAGNOSIS — Z79899 Other long term (current) drug therapy: Secondary | ICD-10-CM | POA: Insufficient documentation

## 2018-09-04 DIAGNOSIS — F17210 Nicotine dependence, cigarettes, uncomplicated: Secondary | ICD-10-CM | POA: Insufficient documentation

## 2018-09-04 DIAGNOSIS — J329 Chronic sinusitis, unspecified: Secondary | ICD-10-CM

## 2018-09-04 MED ORDER — AMOXICILLIN-POT CLAVULANATE 875-125 MG PO TABS: 1.0000 | ORAL_TABLET | Freq: Two times a day (BID) | ORAL | 0 refills | Status: DC

## 2018-09-04 NOTE — Discharge Instructions (Addendum)
It is important to stop smoking.  For nasal congestion you can use Afrin nasal spray twice a day for 3 or 4 days.  Do not use it longer than that.  For cough use Robitussin-DM.  Use Tylenol if needed for pain or fever.  It is safe to return to work tomorrow.  See the doctor of your choice as needed for problems.

## 2018-09-04 NOTE — ED Provider Notes (Signed)
[facility] EMERGENCY DEPARTMENT Provider Note   CSN: [id number] Arrival date & time: 09/04/18  1209    History   Chief Complaint Chief Complaint  Patient presents with  . Cough    HPI Patricia Williamson is a 33 y.o. female.     HPI   She presents for evaluation of rhinorrhea, postnasal drip, and occasional cough, for 5 days.  She smokes tobacco.  No known sick contacts.  Works at a [employer], adult home.  She has asthma and uses an albuterol inhaler occasionally.  No other known medical problems.  There are no other known modifying factors.  Past Medical History:  Diagnosis Date  . Asthma   . Carpal tunnel syndrome   . Gall stones   . Hx of constipation   . Nexplanon insertion 10/11/2013   Inserted left arm 10/11/13 remove 5/27 /18    Patient Active Problem List   Diagnosis Date Noted  . Breakthrough bleeding on Nexplanon 05/06/2016  . Bloody stools 07/25/2015  . Nexplanon insertion 10/11/2013  . Abnormal uterine bleeding (AUB) 08/03/2013  . Asthma exacerbation 02/22/2013  . Bronchitis with asthma, acute 02/22/2013    Past Surgical History:  Procedure Laterality Date  . CHOLECYSTECTOMY N/A 04/06/2014   Procedure: LAPAROSCOPIC CHOLECYSTECTOMY;  Surgeon: Dalia Heading, MD;  Location: [facility];  Service: General;  Laterality: N/A;  . CYSTOSCOPY W/ URETERAL STENT PLACEMENT       OB History    Gravida  2   Para  2   Term  1   Preterm  1   AB      Living  2     SAB      TAB      Ectopic      Multiple      Live Births  2            Home Medications    Prior to Admission medications   Medication Sig Start Date End Date Taking? Authorizing Provider  amoxicillin-clavulanate (AUGMENTIN) 875-125 MG tablet Take 1 tablet by mouth 2 (two) times daily. One po bid x 7 days 09/04/18   Mancel Bale, MD  etonogestrel (NEXPLANON) 68 MG IMPL implant Inject 1 each into the skin once.    [provider]    Family History Family History  Problem  Relation Age of Onset  . Hypertension Mother   . Diabetes Mother   . Hyperlipidemia Mother   . Hypertension Brother   . Diabetes Maternal Grandmother   . Heart disease Maternal Grandmother   . Other Paternal Aunt        benign breast lump    Social History Social History   Tobacco Use  . Smoking status: Current Every Day Smoker    Packs/day: 0.25    Years: 4.00    Pack years: 1.00    Types: Cigarettes  . Smokeless tobacco: Never Used  Substance Use Topics  . Alcohol use: No  . Drug use: No     Allergies   Vinegar [acetic acid] and Sulfa antibiotics   Review of Systems Review of Systems  All other systems reviewed and are negative.    Physical Exam Updated Vital Signs BP 125/86 (BP Location: Right Arm)   Pulse 71   Temp 98.3 F (36.8 C) (Oral)   Resp (!) 22   Ht  (1.549 m)   Wt 85.7 kg   SpO2 98%   BMI 35.71 kg/m   Physical Exam Vitals signs and  nursing note reviewed.  Constitutional:      Appearance: She is well-developed.  HENT:     Head: Normocephalic and atraumatic.     Right Ear: External ear normal.     Left Ear: External ear normal.     Nose:     Comments: Clear rhinorrhea present.    Mouth/Throat:     Mouth: Mucous membranes are moist.     Pharynx: No oropharyngeal exudate or posterior oropharyngeal erythema.  Eyes:     Conjunctiva/sclera: Conjunctivae normal.     Pupils: Pupils are equal, round, and reactive to light.  Neck:     Musculoskeletal: Normal range of motion and neck supple.     Trachea: Phonation normal.  Cardiovascular:     Rate and Rhythm: Normal rate and regular rhythm.     Heart sounds: Normal heart sounds.  Pulmonary:     Effort: Pulmonary effort is normal. No respiratory distress.     Breath sounds: No stridor. Wheezing (Few, scattered) present. No rhonchi.  Chest:     Chest wall: No tenderness.  Abdominal:     Palpations: Abdomen is soft.     Tenderness: There is no abdominal tenderness.  Musculoskeletal:  Normal range of motion.        General: No swelling or tenderness.  Skin:    General: Skin is warm and dry.  Neurological:     Mental Status: She is alert and oriented to person, place, and time.     Cranial Nerves: No cranial nerve deficit.     Sensory: No sensory deficit.     Motor: No abnormal muscle tone.     Coordination: Coordination normal.  Psychiatric:        Mood and Affect: Mood normal.        Behavior: Behavior normal.        Thought Content: Thought content normal.        Judgment: Judgment normal.      ED Treatments / Results  Labs (all labs ordered are listed, but only abnormal results are displayed) Labs Reviewed - No data to display  EKG None  Radiology No results found.  Procedures Procedures (including critical care time)  Medications Ordered in ED Medications - No data to display   Initial Impression / Assessment and Plan / ED Course  I have reviewed the triage vital signs and the nursing notes.  Pertinent labs & imaging results that were available during my care of the patient were reviewed by me and considered in my medical decision making (see chart for details).         Patient Vitals for the past 24 hrs:  BP Temp Temp src Pulse Resp SpO2 Height Weight  09/04/18 1219 125/86 98.3 F (36.8 C) Oral 71 (!) 22 98 % - -  09/04/18 1217 - - - - - - 5\' 1"  (1.549 m) 85.7 kg    1:00 PM Reevaluation with update and discussion. After initial assessment and treatment, an updated evaluation reveals no change in clinical status.  Findings discussed with the patient and all questions were answered. Mancel BaleElliott Ralonda Tartt   Medical Decision Making: Tobacco abuse with upper respiratory symptoms including nasal, sinus and bronchitis.  Mild wheezing present, patient is smoker.  Doubt acute bacterial infection, pneumonia, metabolic instability or impending vascular collapse.  Patricia Williamson was evaluated in Emergency Department on 09/04/2018 for the symptoms  described in the history of present illness. She was evaluated in the context of the global COVID-19  pandemic, which necessitated consideration that the patient might be at risk for infection with the SARS-CoV-2 virus that causes COVID-19. Institutional protocols and algorithms that pertain to the evaluation of patients at risk for COVID-19 are in a state of rapid change based on information released by regulatory bodies including the CDC and federal and state organizations. These policies and algorithms were followed during the patient's care in the ED.   CRITICAL CARE-no Performed by: Mancel Bale  Nursing Notes Reviewed/ Care Coordinated Applicable Imaging Reviewed Interpretation of Laboratory Data incorporated into ED treatment  The patient appears reasonably screened and/or stabilized for discharge and I doubt any other medical condition or other [facility] requiring further screening, evaluation, or treatment in the ED at this time prior to discharge.  Plan: Home Medications-OTC as needed; Home Treatments-stop smoking, rest, fluids; return here if the recommended treatment, does not improve the symptoms; Recommended follow up-PCP,.   Final Clinical Impressions(s) / ED Diagnoses   Final diagnoses:  Sinusitis, unspecified chronicity, unspecified location  Bronchitis  Tobacco abuse    ED Discharge Orders         Ordered    amoxicillin-clavulanate (AUGMENTIN) 875-125 MG tablet  2 times daily     09/04/18 1256           Mancel Bale, MD 09/04/18 1301

## 2018-09-04 NOTE — ED Triage Notes (Signed)
Pt states on Tuesday she began having a productive cough with clear sputum, nasal congestion, wheezing, and some sob with smoking.  Works at [employer].

## 2018-09-07 ENCOUNTER — Encounter: Payer: Medicaid Other | Admitting: Obstetrics and Gynecology

## 2018-10-04 ENCOUNTER — Encounter: Payer: Medicaid Other | Admitting: Obstetrics and Gynecology

## 2018-11-07 ENCOUNTER — Emergency Department (HOSPITAL_COMMUNITY): Payer: Self-pay

## 2018-11-07 ENCOUNTER — Emergency Department (HOSPITAL_COMMUNITY)
Admission: EM | Admit: 2018-11-07 | Discharge: 2018-11-07 | Disposition: A | Discharge status: Home / Self Care | Payer: Self-pay | Attending: Emergency Medicine | Admitting: Emergency Medicine

## 2018-11-07 ENCOUNTER — Other Ambulatory Visit: Payer: Self-pay

## 2018-11-07 ENCOUNTER — Encounter (HOSPITAL_COMMUNITY): Payer: Self-pay | Admitting: *Deleted

## 2018-11-07 DIAGNOSIS — S60221A Contusion of right hand, initial encounter: Secondary | ICD-10-CM

## 2018-11-07 DIAGNOSIS — Y939 Activity, unspecified: Secondary | ICD-10-CM | POA: Insufficient documentation

## 2018-11-07 DIAGNOSIS — W19XXXA Unspecified fall, initial encounter: Secondary | ICD-10-CM | POA: Insufficient documentation

## 2018-11-07 DIAGNOSIS — F17210 Nicotine dependence, cigarettes, uncomplicated: Secondary | ICD-10-CM | POA: Insufficient documentation

## 2018-11-07 DIAGNOSIS — Y999 Unspecified external cause status: Secondary | ICD-10-CM | POA: Insufficient documentation

## 2018-11-07 DIAGNOSIS — Y929 Unspecified place or not applicable: Secondary | ICD-10-CM | POA: Insufficient documentation

## 2018-11-07 DIAGNOSIS — J45909 Unspecified asthma, uncomplicated: Secondary | ICD-10-CM | POA: Insufficient documentation

## 2018-11-07 MED ORDER — IBUPROFEN 800 MG PO TABS: 800.0000 mg | ORAL_TABLET | Freq: Three times a day (TID) | ORAL | 0 refills | Status: DC | PRN

## 2018-11-07 NOTE — ED Provider Notes (Signed)
[facility]ERGENCY DEPART[facility] Provider Note   CSN: [id number] Arrival date & time: 11/07/18  1505     History   Chief Complaint Chief Complaint  Patient presents with  . Hand Injury    HPI Patricia Williamson is a [age] y.o. female.     Pt states she fell and hurt her right hand  The history is provided by the patient. No language interpreter was used.  Hand Injury Location:  Hand Hand location:  R hand Pain details:    Quality:  Aching   Radiates to:  Does not radiate   Severity:  Moderate   Onset quality:  Unable to specify   Timing:  Constant   Progression:  Worsening Handedness:  Right-handed Dislocation: no   Foreign body present:  No foreign bodies Prior injury to area:  No Relieved by:  Nothing Associated symptoms: no back pain and no fatigue     Past Medical History:  Diagnosis Date  . Asthma   . Carpal tunnel syndrome   . Gall stones   . Hx of constipation   . Nexplanon insertion 10/11/2013   Inserted left arm 10/11/13 remove 5/27 /18    Patient Active Problem List   Diagnosis Date Noted  . Breakthrough bleeding on Nexplanon 05/06/2016  . Bloody stools 07/25/2015  . Nexplanon insertion 10/11/2013  . Abnormal uterine bleeding (AUB) 08/03/2013  . Asthma exacerbation 02/22/2013  . Bronchitis with asthma, acute 02/22/2013    Past Surgical History:  Procedure Laterality Date  . CHOLECYSTECTOMY N/A 04/06/2014   Procedure: LAPAROSCOPIC CHOLECYSTECTOMY;  Surgeon: Dalia HeadingMark A Jenkins, MD;  Location: [facility];  Service: General;  Laterality: N/A;  . CYSTOSCOPY W/ URETERAL STENT PLACEMENT       OB History    Gravida  2   Para  2   Term  1   Preterm  1   AB      Living  2     SAB      TAB      Ectopic      Multiple      Live Births  2            Home Medications    Prior to Admission medications   Medication Sig Start Date End Date Taking? Authorizing Provider  amoxicillin-clavulanate (AUGMENTIN) 875-125 MG tablet Take 1 tablet by  mouth 2 (two) times daily. One po bid x 7 days 09/04/18   Mancel BaleWentz, Elliott, MD  etonogestrel (NEXPLANON) 68 MG IMPL implant Inject 1 each into the skin once.    [provider]  ibuprofen (ADVIL) 800 MG tablet Take 1 tablet (800 mg total) by mouth every 8 (eight) hours as needed. 11/07/18   Bethann BerkshireZammit, Ariadna Setter, MD    Family History Family History  Problem Relation Age of Onset  . Hypertension Mother   . Diabetes Mother   . Hyperlipidemia Mother   . Hypertension Brother   . Diabetes Maternal Grandmother   . Heart disease Maternal Grandmother   . Other Paternal Aunt        benign breast lump    Social History Social History   Tobacco Use  . Smoking status: Current Every Day Smoker    Packs/day: 0.25    Years: 4.00    Pack years: 1.00    Types: Cigarettes  . Smokeless tobacco: Never Used  Substance Use Topics  . Alcohol use: No  . Drug use: No     Allergies   Vinegar [acetic acid]  and Sulfa antibiotics   Review of Systems Review of Systems  Constitutional: Negative for appetite change and fatigue.  HENT: Negative for congestion, ear discharge and sinus pressure.   Eyes: Negative for discharge.  Respiratory: Negative for cough.   Cardiovascular: Negative for chest pain.  Gastrointestinal: Negative for abdominal pain and diarrhea.  Genitourinary: Negative for frequency and hematuria.  Musculoskeletal: Negative for back pain.       Right hand pain  Skin: Negative for rash.  Neurological: Negative for seizures and headaches.  Psychiatric/Behavioral: Negative for hallucinations.     Physical Exam Updated Vital Signs BP 121/78 (BP Location: Right Arm)   Pulse 85   Temp 98.2 F (36.8 C) (Oral)   Resp 18   Ht 5\' 1"  (1.549 m)   Wt 85.7 kg   SpO2 97%   BMI 35.71 kg/m   Physical Exam Vitals signs and nursing note reviewed.  Constitutional:      Appearance: She is well-developed.  HENT:     Head: Normocephalic.     Mouth/Throat:     Mouth: Mucous membranes  are moist.  Eyes:     Conjunctiva/sclera: Conjunctivae normal.  Neck:     Trachea: No tracheal deviation.  Cardiovascular:     Heart sounds: No murmur.  Pulmonary:     Effort: Pulmonary effort is normal.  Musculoskeletal: Normal range of motion.     Comments: Tender swollen right hand  Skin:    General: Skin is warm.  Neurological:     Mental Status: She is oriented to person, place, and time.      ED Treatments / Results  Labs (all labs ordered are listed, but only abnormal results are displayed) Labs Reviewed - No data to display  EKG    Radiology Dg Hand Complete Right  Result Date: 11/07/2018 CLINICAL DATA:  Fall.  Pain second through fourth metacarpals. EXAM: RIGHT HAND - COMPLETE 3+ VIEW COMPARISON:  No recent. FINDINGS: No acute bony or joint abnormality. No evidence of fracture or dislocation. IMPRESSION: No acute abnormality. Electronically Signed   By: Marcello Moores  Register   On: 11/07/2018 15:28    Procedures Procedures (including critical care time)  Medications Ordered in ED Medications - No data to display   Initial Impression / Assessment and Plan / ED Course  I have reviewed the triage vital signs and the nursing notes.  Pertinent labs & imaging results that were available during my care of the patient were reviewed by me and considered in my medical decision making (see chart for details).        Pt with right hand contusion.  tx with motrin and ortho follow up.  Out of work until thursday  Final Clinical Impressions(s) / ED Diagnoses   Final diagnoses:  Contusion of right hand, initial encounter    ED Discharge Orders         Ordered    ibuprofen (ADVIL) 800 MG tablet  Every 8 hours PRN     11/07/18 1538           Milton Ferguson, MD 11/07/18 1542

## 2018-11-07 NOTE — ED Triage Notes (Signed)
Pt had a fall yesterday and today woke up with swelling and bruising noted to right hand. Denies hitting her head.

## 2018-11-07 NOTE — Discharge Instructions (Signed)
Follow up with Dr. Aline Brochure next week if not improving

## 2018-12-21 ENCOUNTER — Telehealth: Payer: Self-pay

## 2018-12-21 DIAGNOSIS — Z20822 Contact with and (suspected) exposure to covid-19: Secondary | ICD-10-CM

## 2018-12-21 NOTE — Telephone Encounter (Signed)
Lab

## 2018-12-22 LAB — NOVEL CORONAVIRUS, NAA: SARS-CoV-2, NAA: NOT DETECTED

## 2018-12-29 ENCOUNTER — Encounter (HOSPITAL_COMMUNITY): Payer: Self-pay | Admitting: Emergency Medicine

## 2018-12-29 ENCOUNTER — Emergency Department (HOSPITAL_COMMUNITY): Payer: Self-pay

## 2018-12-29 ENCOUNTER — Emergency Department (HOSPITAL_COMMUNITY)
Admission: EM | Admit: 2018-12-29 | Discharge: 2018-12-29 | Disposition: A | Discharge status: Home / Self Care | Payer: Self-pay | Attending: Emergency Medicine | Admitting: Emergency Medicine

## 2018-12-29 ENCOUNTER — Other Ambulatory Visit: Payer: Self-pay

## 2018-12-29 DIAGNOSIS — J45909 Unspecified asthma, uncomplicated: Secondary | ICD-10-CM | POA: Insufficient documentation

## 2018-12-29 DIAGNOSIS — Z79899 Other long term (current) drug therapy: Secondary | ICD-10-CM | POA: Insufficient documentation

## 2018-12-29 DIAGNOSIS — R102 Pelvic and perineal pain: Secondary | ICD-10-CM

## 2018-12-29 DIAGNOSIS — R1031 Right lower quadrant pain: Secondary | ICD-10-CM | POA: Insufficient documentation

## 2018-12-29 DIAGNOSIS — F17210 Nicotine dependence, cigarettes, uncomplicated: Secondary | ICD-10-CM | POA: Insufficient documentation

## 2018-12-29 LAB — URINALYSIS, ROUTINE W REFLEX MICROSCOPIC
Bilirubin Urine: NEGATIVE
Glucose, UA: NEGATIVE mg/dL
Ketones, ur: NEGATIVE mg/dL
Leukocytes,Ua: NEGATIVE
Nitrite: NEGATIVE
Protein, ur: NEGATIVE mg/dL
Specific Gravity, Urine: 1.005 (ref 1.005–1.030)
pH: 6 (ref 5.0–8.0)

## 2018-12-29 LAB — COMPREHENSIVE METABOLIC PANEL
ALT: 19 U/L (ref 0–44)
AST: 16 U/L (ref 15–41)
Albumin: 4.2 g/dL (ref 3.5–5.0)
Alkaline Phosphatase: 50 U/L (ref 38–126)
Anion gap: 8 (ref 5–15)
BUN: 9 mg/dL (ref 6–20)
CO2: 22 mmol/L (ref 22–32)
Calcium: 9.1 mg/dL (ref 8.9–10.3)
Chloride: 109 mmol/L (ref 98–111)
Creatinine, Ser: 0.44 mg/dL (ref 0.44–1.00)
GFR calc Af Amer: >60 mL/min (ref >60)
GFR calc non Af Amer: >60 mL/min (ref >60)
Glucose, Bld: 96 mg/dL (ref 70–99)
Potassium: 3.8 mmol/L (ref 3.5–5.1)
Sodium: 139 mmol/L (ref 135–145)
Total Bilirubin: 0.5 mg/dL (ref 0.3–1.2)
Total Protein: 7.3 g/dL (ref 6.5–8.1)

## 2018-12-29 LAB — CBC WITH DIFFERENTIAL/PLATELET
Abs Immature Granulocytes: 0.04 10*3/uL (ref 0.00–0.07)
Basophils Absolute: 0.1 10*3/uL (ref 0.0–0.1)
Basophils Relative: 1 %
Eosinophils Absolute: 0.1 10*3/uL (ref 0.0–0.5)
Eosinophils Relative: 1 %
HCT: 48.3 % — ABNORMAL HIGH (ref 36.0–46.0)
Hemoglobin: 16.2 g/dL — ABNORMAL HIGH (ref 12.0–15.0)
Immature Granulocytes: 0 %
Lymphocytes Relative: 25 %
Lymphs Abs: 2.4 10*3/uL (ref 0.7–4.0)
MCH: 29.6 pg (ref 26.0–34.0)
MCHC: 33.5 g/dL (ref 30.0–36.0)
MCV: 88.3 fL (ref 80.0–100.0)
Monocytes Absolute: 0.6 10*3/uL (ref 0.1–1.0)
Monocytes Relative: 6 %
Neutro Abs: 6.3 10*3/uL (ref 1.7–7.7)
Neutrophils Relative %: 67 %
Platelets: 247 10*3/uL (ref 150–400)
RBC: 5.47 MIL/uL — ABNORMAL HIGH (ref 3.87–5.11)
RDW: 13.1 % (ref 11.5–15.5)
WBC: 9.4 10*3/uL (ref 4.0–10.5)
nRBC: 0 % (ref 0.0–0.2)

## 2018-12-29 LAB — I-STAT BETA HCG BLOOD, ED (MC, WL, AP ONLY): I-stat hCG, quantitative: <5 m[IU]/mL (ref <5)

## 2018-12-29 MED ORDER — IOHEXOL 300 MG/ML  SOLN
100.0000 mL | Freq: Once | INTRAMUSCULAR | Status: AC | PRN
  Administered 2018-12-29: 100 mL via INTRAVENOUS

## 2018-12-29 MED ORDER — KETOROLAC TROMETHAMINE 30 MG/ML IJ SOLN
15.0000 mg | Freq: Once | INTRAMUSCULAR | Status: AC
  Administered 2018-12-29: 16:00:00 15 mg via INTRAVENOUS
  Filled 2018-12-29: qty 1

## 2018-12-29 MED ORDER — MORPHINE SULFATE (PF) 4 MG/ML IV SOLN
4.0000 mg | Freq: Once | INTRAVENOUS | Status: AC
  Administered 2018-12-29: 14:00:00 4 mg via INTRAVENOUS
  Filled 2018-12-29: qty 1

## 2018-12-29 MED ORDER — ONDANSETRON HCL 4 MG/2ML IJ SOLN
4.0000 mg | Freq: Once | INTRAMUSCULAR | Status: AC
  Administered 2018-12-29: 4 mg via INTRAVENOUS
  Filled 2018-12-29: qty 2

## 2018-12-29 NOTE — ED Triage Notes (Signed)
Patient reports RLQ abdominal pain that started 3 days ago. Patient states the pain radiates across her lower abdomen and into her back. Reports nausea but denies vomiting or diarrhea.

## 2018-12-29 NOTE — ED Provider Notes (Signed)
[facility] Provider [facility]   CSN: [id number] Arrival date & time: 12/29/18  1124     History   Chief Complaint Chief Complaint  Patient presents with  . Abdominal Pain    HPI Patricia Williamson is a 33 y.o. female.  Presents emerge department chief complaint abdominal pain.  Patient states pain worse in her right lower quadrant.  Has been going on after the past few days, steadily worsening.  Now 8 out of 10 severity, sharp, pain, nonradiating.  Denies pain in her pelvic area, no vaginal discharge, vaginal bleeding.  Vomiting, has had mild to moderate nausea.  No constipation or diarrhea.  No blood in stools.  Patient has past surgical history of cholecystectomy.  Patient denies any chronic medical conditions.     HPI  Past Medical History:  Diagnosis Date  . Asthma   . Carpal tunnel syndrome   . Gall stones   . Hx of constipation   . Nexplanon insertion 10/11/2013   Inserted left arm 10/11/13 remove 5/27 /18    Patient Active Problem List   Diagnosis Date Noted  . Breakthrough bleeding on Nexplanon 05/06/2016  . Bloody stools 07/25/2015  . Nexplanon insertion 10/11/2013  . Abnormal uterine bleeding (AUB) 08/03/2013  . Asthma exacerbation 02/22/2013  . Bronchitis with asthma, acute 02/22/2013    Past Surgical History:  Procedure Laterality Date  . CHOLECYSTECTOMY N/A 04/06/2014   Procedure: LAPAROSCOPIC CHOLECYSTECTOMY;  Surgeon: Dalia HeadingMark A Jenkins, MD;  Location: [facility];  Service: General;  Laterality: N/A;  . CYSTOSCOPY W/ URETERAL STENT PLACEMENT       OB History    Gravida  2   Para  2   Term  1   Preterm  1   AB      Living  2     SAB      TAB      Ectopic      Multiple      Live Births  2            Home Medications    Prior to Admission medications   Medication Sig Start Date End Date Taking? Authorizing Provider  amoxicillin-clavulanate (AUGMENTIN) 875-125 MG tablet Take 1 tablet by mouth 2 (two) times daily.  One po bid x 7 days 09/04/18   Mancel BaleWentz, Elliott, MD  etonogestrel (NEXPLANON) 68 MG IMPL implant Inject 1 each into the skin once.    [provider]  ibuprofen (ADVIL) 800 MG tablet Take 1 tablet (800 mg total) by mouth every 8 (eight) hours as needed. 11/07/18   Bethann BerkshireZammit, Joseph, MD    Family History Family History  Problem Relation Age of Onset  . Hypertension Mother   . Diabetes Mother   . Hyperlipidemia Mother   . Hypertension Brother   . Diabetes Maternal Grandmother   . Heart disease Maternal Grandmother   . Other Paternal Aunt        benign breast lump    Social History Social History   Tobacco Use  . Smoking status: Current Every Day Smoker    Packs/day: 1.00    Years: 4.00    Pack years: 4.00    Types: Cigarettes  . Smokeless tobacco: Never Used  Substance Use Topics  . Alcohol use: No  . Drug use: No     Allergies   Vinegar [acetic acid] and Sulfa antibiotics   Review of Systems Review of Systems  Constitutional: Negative for chills and fever.  HENT: Negative  for ear pain and sore throat.   Eyes: Negative for pain and visual disturbance.  Respiratory: Negative for cough and shortness of breath.   Cardiovascular: Negative for chest pain and palpitations.  Gastrointestinal: Positive for abdominal pain. Negative for vomiting.  Genitourinary: Negative for dysuria and hematuria.  Musculoskeletal: Negative for arthralgias and back pain.  Skin: Negative for color change and rash.  Neurological: Negative for seizures and syncope.  All other systems reviewed and are negative.    Physical Exam Updated Vital Signs BP (!) 144/81 (BP Location: Right Arm)   Pulse 89   Temp 98.1 F (36.7 C) (Oral)   Resp 17   SpO2 98%   Physical Exam Vitals signs and nursing note reviewed.  Constitutional:      General: She is not in acute distress.    Appearance: She is well-developed.  HENT:     Head: Normocephalic and atraumatic.  Eyes:     Conjunctiva/sclera:  Conjunctivae normal.  Neck:     Musculoskeletal: Neck supple.  Cardiovascular:     Rate and Rhythm: Normal rate and regular rhythm.     Heart sounds: No murmur.  Pulmonary:     Effort: Pulmonary effort is normal. No respiratory distress.     Breath sounds: Normal breath sounds.  Abdominal:     Palpations: Abdomen is soft.     Comments: Generalized tenderness palpation, worse in right lower quadrant  Skin:    General: Skin is warm and dry.  Neurological:     Mental Status: She is alert.      ED Treatments / Results  Labs (all labs ordered are listed, but only abnormal results are displayed) Labs Reviewed  CBC WITH DIFFERENTIAL/PLATELET  COMPREHENSIVE METABOLIC PANEL  URINALYSIS, ROUTINE W REFLEX MICROSCOPIC  I-STAT BETA HCG BLOOD, ED ([facility], [facility], [facility] ONLY)    EKG None  Radiology No results found.  Procedures Procedures (including critical care time)  Medications Ordered in ED Medications - No data to display   Initial Impression / Assessment and Plan / ED Course  I have reviewed the triage vital signs and the nursing notes.  Pertinent labs & imaging results that were available during my care of the patient were reviewed by me and considered in my medical decision making (see chart for details).        33 year old lady who presents to the emergency department with chief complaint of right lower quadrant pain.  On exam patient well-appearing with normal vital signs.  Given location of pain concern for possible appendicitis.  Labs within normal limits, CT scan negative for acute abdominal pathology.  I did note small ovarian cyst.  After discussion with patient, placed order to obtain transvaginal ultrasound to further evaluate.  Patient however left before the study was completed.  Patient states she will follow-up with her outpatient gynecologist.    After the discussed management above, the patient was determined to be safe for discharge.  The patient was in agreement  with this plan and all questions regarding their care were answered.  ED return precautions were discussed and the patient will return to the ED with any significant worsening of condition.    Final Clinical Impressions(s) / ED Diagnoses   Final diagnoses:  Right lower quadrant abdominal pain    ED Discharge Orders    None       Lucrezia Starch, MD 12/29/18 1643

## 2018-12-29 NOTE — Discharge Instructions (Signed)
Recommend Tylenol, Motrin as needed for pain control.  Please return to the emergency department if you develop new or worsening abdominal pain, vomiting, fever or other new concerning symptom.

## 2019-01-09 ENCOUNTER — Ambulatory Visit (INDEPENDENT_AMBULATORY_CARE_PROVIDER_SITE_OTHER): Payer: Medicaid Other | Admitting: Obstetrics and Gynecology

## 2019-01-09 ENCOUNTER — Encounter: Payer: Self-pay | Admitting: Obstetrics and Gynecology

## 2019-01-09 ENCOUNTER — Other Ambulatory Visit: Payer: Self-pay

## 2019-01-09 VITALS — BP 127/82 | HR 75 | Ht 60.0 in | Wt 190.2 lb

## 2019-01-09 DIAGNOSIS — Z3009 Encounter for other general counseling and advice on contraception: Secondary | ICD-10-CM

## 2019-01-09 NOTE — Progress Notes (Signed)
Patient ID: Patricia Clackisha D Artiaga, female   DOB: Mar 12, 1986, 33 y.o.   MRN: [id number]  Preoperative History and Physical  Patricia Williamson is a 33 y.o. [id number] here for surgical management of permanent contraception.   No significant preoperative concerns. Still has nexplanon in, is chronically fatigued,  w/ weight gain, and irritability. Goes go to bed between 8:30-9 has trouble waking up. Has constant feeling ill with no nausea or vomiting or abdominal pain.Last year weight was around 180, weight now is 190  Proposed surgery: BLT  Past Medical History:  Diagnosis Date  . Asthma   . Carpal tunnel syndrome   . Gall stones   . Hx of constipation   . Nexplanon insertion 10/11/2013   Inserted left arm 10/11/13 remove 5/27 /18   Past Surgical History:  Procedure Laterality Date  . CHOLECYSTECTOMY N/A 04/06/2014   Procedure: LAPAROSCOPIC CHOLECYSTECTOMY;  Surgeon: Dalia HeadingMark A Jenkins, MD;  Location: [facility];  Service: General;  Laterality: N/A;  . CYSTOSCOPY W/ URETERAL STENT PLACEMENT     OB History  Gravida Para Term Preterm AB Living  2 2 1 1   2   SAB TAB Ectopic Multiple Live Births          2    # Outcome Date GA Lbr Len/2nd Weight Sex Delivery Anes PTL Lv  2 Preterm 05/22/13 [id number] 01:35 / 02:18 4 lb 11.8 oz (2.15 kg) F Vag-Spont EPI  LIV     Birth Comments: Mom 33 y/o G2P2, GBS + partially treated, Rubella non immune, Good prenatal care. Mom hospitaluzed during pregnancy with cholelithiasis Baby 34 w, NSVD, NICU x 17 days for prematurity and r/o sepsis. Hypotonic. No Resp issues. No phototherapy. Has anemia, on iron. Had murmer of PPS with no need to f/u unless worse. BW 2150, DW 2345.  1 Term 01/02/10 1773w0d  6 lb 5 oz (2.863 kg) F Vag-Spont EPI  LIV  Patient denies any other pertinent gynecologic issues.   Current Outpatient Medications on File Prior to Visit  Medication Sig Dispense Refill  . etonogestrel (NEXPLANON) 68 MG IMPL implant Inject 1 each into the skin once.    Marland Kitchen. ibuprofen  (ADVIL) 800 MG tablet Take 1 tablet (800 mg total) by mouth every 8 (eight) hours as needed. 21 tablet 0   No current facility-administered medications on file prior to visit.    Allergies  Allergen Reactions  . Vinegar [Acetic Acid] Hives and Itching  . Sulfa Antibiotics Rash    Social History:   reports that she has been smoking cigarettes. She has a 4.00 pack-year smoking history. She has never used smokeless tobacco. She reports that she does not drink alcohol or use drugs.  Family History  Problem Relation Age of Onset  . Hypertension Mother   . Diabetes Mother   . Hyperlipidemia Mother   . Hypertension Brother   . Diabetes Maternal Grandmother   . Heart disease Maternal Grandmother   . Other Paternal Aunt        benign breast lump    Review of Systems: Noncontributory  PHYSICAL EXAM: Blood pressure 127/82, pulse 75, height 5' (1.524 m), weight 190 lb 3.2 oz (86.3 kg). General appearance - alert, well appearing, and in no distress Chest - Not Done Heart - Not Done Abdomen - Not Done Pelvic - examination NOT DONE  Labs: Results for orders placed or performed during the hospital encounter of 12/29/18 (from the past 336 hour(s))  CBC with Differential   Collection Time: 12/29/18  11:44 AM  Result Value Ref Range   WBC 9.4 4.0 - 10.5 K/uL   RBC 5.47 (H) 3.87 - 5.11 MIL/uL   Hemoglobin 16.2 (H) 12.0 - 15.0 g/dL   HCT 16.148.3 (H) 09.636.0 - 04.546.0 %   MCV 88.3 80.0 - 100.0 fL   MCH 29.6 26.0 - 34.0 pg   MCHC 33.5 30.0 - 36.0 g/dL   RDW 40.913.1 81.111.5 - 91.415.5 %   Platelets 247 150 - 400 K/uL   nRBC 0.0 0.0 - 0.2 %   Neutrophils Relative % 67 %   Neutro Abs 6.3 1.7 - 7.7 K/uL   Lymphocytes Relative 25 %   Lymphs Abs 2.4 0.7 - 4.0 K/uL   Monocytes Relative 6 %   Monocytes Absolute 0.6 0.1 - 1.0 K/uL   Eosinophils Relative 1 %   Eosinophils Absolute 0.1 0.0 - 0.5 K/uL   Basophils Relative 1 %   Basophils Absolute 0.1 0.0 - 0.1 K/uL   Immature Granulocytes 0 %   Abs Immature  Granulocytes 0.04 0.00 - 0.07 K/uL  Comprehensive metabolic panel   Collection Time: 12/29/18 11:44 AM  Result Value Ref Range   Sodium 139 135 - 145 mmol/L   Potassium 3.8 3.5 - 5.1 mmol/L   Chloride 109 98 - 111 mmol/L   CO2 22 22 - 32 mmol/L   Glucose, Bld 96 70 - 99 mg/dL   BUN 9 6 - 20 mg/dL   Creatinine, Ser 7.820.44 0.44 - 1.00 mg/dL   Calcium 9.1 8.9 - 95.610.3 mg/dL   Total Protein 7.3 6.5 - 8.1 g/dL   Albumin 4.2 3.5 - 5.0 g/dL   AST 16 15 - 41 U/L   ALT 19 0 - 44 U/L   Alkaline Phosphatase 50 38 - 126 U/L   Total Bilirubin 0.5 0.3 - 1.2 mg/dL   GFR calc non Af Amer >60 >60 mL/min   GFR calc Af Amer >60 >60 mL/min   Anion gap 8 5 - 15  Urinalysis, Routine w reflex microscopic   Collection Time: 12/29/18 11:44 AM  Result Value Ref Range   Color, Urine YELLOW YELLOW   APPearance HAZY (A) CLEAR   Specific Gravity, Urine 1.005 1.005 - 1.030   pH 6.0 5.0 - 8.0   Glucose, UA NEGATIVE NEGATIVE mg/dL   Hgb urine dipstick MODERATE (A) NEGATIVE   Bilirubin Urine NEGATIVE NEGATIVE   Ketones, ur NEGATIVE NEGATIVE mg/dL   Protein, ur NEGATIVE NEGATIVE mg/dL   Nitrite NEGATIVE NEGATIVE   Leukocytes,Ua NEGATIVE NEGATIVE   RBC / HPF 0-5 0 - 5 RBC/hpf   WBC, UA 0-5 0 - 5 WBC/hpf   Bacteria, UA RARE (A) NONE SEEN   Squamous Epithelial / LPF 11-20 0 - 5  I-Stat Beta hCG blood, ED ([facility], [facility], [facility] only)   Collection Time: 12/29/18 12:02 PM  Result Value Ref Range   I-stat hCG, quantitative <5.0 <5 mIU/mL   Comment 3            Imaging Studies: Ct Abdomen Pelvis W Contrast  Result Date: 12/29/2018 CLINICAL DATA:  Abdominal pain, primarily right lower quadrant. Nausea. EXAM: CT ABDOMEN AND PELVIS WITH CONTRAST TECHNIQUE: Multidetector CT imaging of the abdomen and pelvis was performed using the standard protocol following bolus administration of intravenous contrast. CONTRAST:  100mL OMNIPAQUE IOHEXOL 300 MG/ML  SOLN COMPARISON:  None. FINDINGS: Lower chest: There is mild atelectatic change  in the lung bases. There is no edema or consolidation in the lung bases.  Hepatobiliary: There is fatty infiltration near the fissure for the ligamentum teres. No focal liver lesions are apparent. The gallbladder is absent. There is no biliary duct dilatation. Pancreas: There is no pancreatic mass or inflammatory focus. Spleen: No splenic lesions are evident. Adrenals/Urinary Tract: Adrenals bilaterally appear normal. Kidneys bilaterally show no evident mass or hydronephrosis on either side. Stomach/Bowel: There is no appreciable bowel wall or mesenteric thickening. No appreciable diverticular disease evident. There is no appreciable bowel obstruction. Terminal ileum appears unremarkable. There is no free air or portal venous air. Vascular/Lymphatic: There is no abdominal aortic aneurysm. No vascular lesions are evident. There is no evident adenopathy in the abdomen or pelvis by size criteria. There are several subcentimeter lymph nodes in the right mid to lower abdomen as well as in the left retroperitoneal region. Reproductive: Uterus is anteverted. There is a presumed dominant follicle arising from the right ovary measuring 2.4 x 2.1 cm. Beyond this apparent follicle, no pelvic masses are evident. Other: Appendix appears unremarkable. No evident abscess or ascites in the abdomen or pelvis. There is a small ventral hernia containing fat but no bowel. Musculoskeletal: No blastic or lytic bone lesions. There are no intramuscular lesions. IMPRESSION: 1. No evident bowel obstruction. Appendix appears normal. No diverticulitis. No abscess in the abdomen or pelvis. 2. Subcentimeter lymph nodes in the right mid to lower abdomen. In the appropriate clinical setting, these lymph nodes may be indicative of a degree of mesenteric adenitis. No frank adenopathy by size criteria in the abdomen or pelvis evident on this study. 3. No evident renal or ureteral calculus. No hydronephrosis. Urinary bladder wall thickness within  normal limits. 4.  Gallbladder absent. Electronically Signed   By: Lowella Grip III M.D.   On: 12/29/2018 15:03    Assessment: Patient Active Problem List   Diagnosis Date Noted  . Breakthrough bleeding on Nexplanon 05/06/2016  . Bloody stools 07/25/2015  . Nexplanon insertion 10/11/2013  . Abnormal uterine bleeding (AUB) 08/03/2013  . Asthma exacerbation 02/22/2013  . Bronchitis with asthma, acute 02/22/2013    Plan: Patient will undergo surgical management with BTL Pt to sign Tubal Sterilization forms today Pt to return  1-2 weeks before surgery for exam Pt to be scheduled for 30+ days from now for Laparoscopic Tubal Ligation Fallope Rings..   By signing my name below, I, Samul Dada, attest that this documentation has been prepared under the direction and in the presence of Jonnie Kind, MD. Electronically Signed: [place]. 01/09/19. 8:52 AM.  I personally performed the services described in this documentation, which was SCRIBED in my presence. The recorded information has been reviewed and considered accurate. It has been edited as necessary during review. Jonnie Kind, MD

## 2019-01-15 ENCOUNTER — Emergency Department (HOSPITAL_COMMUNITY)
Admission: EM | Admit: 2019-01-15 | Discharge: 2019-01-15 | Disposition: A | Discharge status: Home / Self Care | Payer: Self-pay | Attending: Emergency Medicine | Admitting: Emergency Medicine

## 2019-01-15 ENCOUNTER — Other Ambulatory Visit: Payer: Self-pay

## 2019-01-15 ENCOUNTER — Encounter (HOSPITAL_COMMUNITY): Payer: Self-pay | Admitting: Emergency Medicine

## 2019-01-15 ENCOUNTER — Emergency Department (HOSPITAL_COMMUNITY): Payer: Self-pay

## 2019-01-15 DIAGNOSIS — Z79899 Other long term (current) drug therapy: Secondary | ICD-10-CM | POA: Insufficient documentation

## 2019-01-15 DIAGNOSIS — J45909 Unspecified asthma, uncomplicated: Secondary | ICD-10-CM | POA: Insufficient documentation

## 2019-01-15 DIAGNOSIS — F17210 Nicotine dependence, cigarettes, uncomplicated: Secondary | ICD-10-CM | POA: Insufficient documentation

## 2019-01-15 DIAGNOSIS — M79671 Pain in right foot: Secondary | ICD-10-CM | POA: Insufficient documentation

## 2019-01-15 MED ORDER — DICLOFENAC SODIUM 1 % TD GEL: 4.0000 g | Freq: Four times a day (QID) | TRANSDERMAL | 0 refills | Status: DC

## 2019-01-15 NOTE — Discharge Instructions (Addendum)
Please read and follow all provided instructions.  You have been seen today for right foot and ankle pain.  We have applied an ankle brace to help with stabilization.  We would like you to discuss heel cups with the pharmacist as these are available over-the-counter and we think may help with your pain.  Tests performed today include: An x-ray of the affected area - does NOT show any broken bones or dislocations.  Vital signs. See below for your results today.   Home care instructions: -- *PRICE in the first 24-48 hours after injury: Protect (with brace, splint, sling), if given by your provider Rest Ice- Do not apply ice pack directly to your skin, place towel or similar between your skin and ice/ice pack. Apply ice for 20 min, then remove for 40 min while awake Compression- Wear brace, elastic bandage, splint as directed by your provider Elevate affected extremity above the level of your heart when not walking around for the first 24-48 hours   Medications:  - Diclofenac gel-please apply to area of pain to your right foot/ankle up to 4 times per day as needed.  You make take Tylenol per over the counter dosing with these medications.   We have prescribed you new medication(s) today. Discuss the medications prescribed today with your pharmacist as they can have adverse effects and interactions with your other medicines including over the counter and prescribed medications. Seek medical evaluation if you start to experience new or abnormal symptoms after taking one of these medicines, seek care immediately if you start to experience difficulty breathing, feeling of your throat closing, facial swelling, or rash as these could be indications of a more serious allergic reaction   Follow-up instructions: Please follow-up with your primary care provider or the provided orthopedic physician (bone specialist) if you continue to have significant pain in 1 week. In this case you may have a more severe  injury that requires further care.   Return instructions:  Please return if your digits or extremity are numb or tingling, appear gray or blue, or you have severe pain (also elevate the extremity and loosen splint or wrap if you were given one) Please return if you have redness or fevers.  Please return to the Emergency Department if you experience worsening symptoms.  Please return if you have any other emergent concerns. Additional Information:  Your vital signs today were: BP 129/79 (BP Location: Right Arm)    Pulse 72    Temp 98.2 F (36.8 C) (Oral)    Resp 19    SpO2 98%  If your blood pressure (BP) was elevated above 135/85 this visit, please have this repeated by your doctor within one month. ---------------

## 2019-01-15 NOTE — ED Notes (Signed)
Patient ambulated to the room from triage with no obvious problem.

## 2019-01-15 NOTE — ED Triage Notes (Signed)
Pt C/O right foot pain after playing with her daughter last night. Pt states she heard a "pop in my foot." Pt also stating "there was a knot on the top of my foot that has been there for a week." No deformity noted.

## 2019-01-15 NOTE — ED Notes (Signed)
Patient transported to X-ray 

## 2019-01-15 NOTE — ED Provider Notes (Signed)
[facility] Provider Note   CSN: [id number] Arrival date & time: 01/15/19  1913     History   Chief Complaint Chief Complaint  Patient presents with  . Foot Pain    HPI Patricia Williamson is a 33 y.o. female who presents to the ED with complaints of R foot/ankle pain x 1 week worsened last night. Patient states she has had pain to the diffuse R foot & to the R anterior ankle. Pain is constant, hurts more in the heel when she first bears weight in the AM, but overall pain progressively worsens throughout the day. Pain worse with movement & walking. No alleviating factors. Tried OTC ibuprofen without relief. She states she thinks there is a knot on the front of her ankle. She was playing with her child last night and felt a pop in her foot which seemed to exacerbate the pain. Denies numbness, tingling, weakness, redness, or fever.      HPI  Past Medical History:  Diagnosis Date  . Asthma   . Carpal tunnel syndrome   . Gall stones   . Hx of constipation   . Nexplanon insertion 10/11/2013   Inserted left arm 10/11/13 remove 5/27 /18    Patient Active Problem List   Diagnosis Date Noted  . Breakthrough bleeding on Nexplanon 05/06/2016  . Bloody stools 07/25/2015  . Nexplanon insertion 10/11/2013  . Abnormal uterine bleeding (AUB) 08/03/2013  . Asthma exacerbation 02/22/2013  . Bronchitis with asthma, acute 02/22/2013    Past Surgical History:  Procedure Laterality Date  . CHOLECYSTECTOMY N/A 04/06/2014   Procedure: LAPAROSCOPIC CHOLECYSTECTOMY;  Surgeon: Dalia HeadingMark A Jenkins, MD;  Location: [facility];  Service: General;  Laterality: N/A;  . CYSTOSCOPY W/ URETERAL STENT PLACEMENT       OB History    Gravida  2   Para  2   Term  1   Preterm  1   AB      Living  2     SAB      TAB      Ectopic      Multiple      Live Births  2            Home Medications    Prior to Admission medications   Medication Sig Start Date End Date Taking?  Authorizing Provider  diclofenac sodium (VOLTAREN) 1 % GEL Apply 4 g topically 4 (four) times daily. 01/15/19   Petrucelli, Samantha R, PA-C  etonogestrel (NEXPLANON) 68 MG IMPL implant Inject 1 each into the skin once.    [provider]  ibuprofen (ADVIL) 800 MG tablet Take 1 tablet (800 mg total) by mouth every 8 (eight) hours as needed. 11/07/18   Bethann BerkshireZammit, Joseph, MD    Family History Family History  Problem Relation Age of Onset  . Hypertension Mother   . Diabetes Mother   . Hyperlipidemia Mother   . Hypertension Brother   . Diabetes Maternal Grandmother   . Heart disease Maternal Grandmother   . Other Paternal Aunt        benign breast lump    Social History Social History   Tobacco Use  . Smoking status: Current Every Day Smoker    Packs/day: 1.00    Years: 4.00    Pack years: 4.00    Types: Cigarettes  . Smokeless tobacco: Never Used  Substance Use Topics  . Alcohol use: No  . Drug use: No     Allergies  Vinegar [acetic acid] and Sulfa antibiotics  Review of Systems Review of Systems  Constitutional: Negative for chills and fever.  Musculoskeletal: Positive for arthralgias.  Skin: Negative for wound.  Neurological: Negative for weakness and numbness.    Physical Exam Updated Vital Signs BP 129/79 (BP Location: Right Arm)   Pulse 72   Temp 98.2 F (36.8 C) (Oral)   Resp 19   SpO2 98%   Physical Exam Vitals signs and nursing note reviewed.  Constitutional:      General: She is not in acute distress.    Appearance: She is not ill-appearing or toxic-appearing.  HENT:     Head: Normocephalic and atraumatic.  Cardiovascular:     Pulses:          Dorsalis pedis pulses are 2+ on the right side and 2+ on the left side.       Posterior tibial pulses are 2+ on the right side and 2+ on the left side.  Pulmonary:     Effort: Pulmonary effort is normal.  Musculoskeletal:     Comments: Lower extremities: No obvious deformity, appreciable swelling,  edema, erythema, ecchymosis, warmth, or open wounds. Patient has intact AROM to bilateral hips, knees, ankles, and all digits. RLE: tender to palpation over the anterior ankle over the superior extensor retinaculum as well as to the plantar fascia region over the calcaneus. Otherwise nontender. No point/focal bony tenderness.   Skin:    General: Skin is warm and dry.     Capillary Refill: Capillary refill takes less than 2 seconds.  Neurological:     Mental Status: She is alert.     Comments: Alert. Clear speech. Sensation grossly intact to bilateral lower extremities. 5/5 strength with plantar/dorsiflexion bilaterally. Patient ambulatory with steady gait, no foot drop noted.   Psychiatric:        Mood and Affect: Mood normal.        Behavior: Behavior normal.    ED Treatments / Results  Labs (all labs ordered are listed, but only abnormal results are displayed) Labs Reviewed - No data to display  EKG None  Radiology Dg Ankle Complete Right  Result Date: 01/15/2019 CLINICAL DATA:  Right foot and ankle pain after injury. Heard a pop. EXAM: RIGHT ANKLE - COMPLETE 3+ VIEW COMPARISON:  None. FINDINGS: There is no evidence of fracture, dislocation, or joint effusion. Plantar calcaneal spur and Achilles tendon enthesophyte. There is no evidence of arthropathy or other focal bone abnormality. Soft tissues are unremarkable. IMPRESSION: 1. No fracture or subluxation of the right ankle. 2. Plantar calcaneal spur and Achilles tendon enthesophyte. Electronically Signed   By: Narda Rutherford M.D.   On: 01/15/2019 20:27   Dg Foot Complete Right  Result Date: 01/15/2019 CLINICAL DATA:  Right foot and ankle pain after injury. Heard a pop. EXAM: RIGHT FOOT COMPLETE - 3+ VIEW COMPARISON:  None. FINDINGS: There is no evidence of fracture or dislocation. Plantar calcaneal spur and Achilles tendon enthesophyte. There is no evidence of arthropathy or other focal bone abnormality. Soft tissues are unremarkable.  IMPRESSION: No fracture or subluxation of the right foot. Electronically Signed   By: Narda Rutherford M.D.   On: 01/15/2019 20:28    Procedures Procedures (including critical care time)  Medications Ordered in ED Medications - No data to display   Initial Impression / Assessment and Plan / ED Course  I have reviewed the triage vital signs and the nursing notes.  Pertinent labs & imaging results that were available  during my care of the patient were reviewed by me and considered in my medical decision making (see chart for details).   Patient presents to the ED w/ complaints of R foot/ankle pain.  No fever, erythema, or warmth- not consistent w/ infectious process such as septic joint. No appreciable masses/knots as patient expressed concern for. Xray per triage negative for fx/dislocation. Unclear definitive etiology, query components of plantar fascitis & tendonitis of the extensor tendons. Applied ASO in the ED. Recommended heel cups. Trial of diclofenac gel. Orthopedics follow up. I discussed results, treatment plan, need for follow-up, and return precautions with the patient. Provided opportunity for questions, patient confirmed understanding and is in agreement with plan.   Final Clinical Impressions(s) / ED Diagnoses   Final diagnoses:  Foot pain, right    ED Discharge Orders         Ordered    diclofenac sodium (VOLTAREN) 1 % GEL  4 times daily     01/15/19 2057           Amaryllis Dyke, PA-C 01/15/19 2115    Nat Christen, MD 01/18/19 [phone]

## 2019-01-25 ENCOUNTER — Other Ambulatory Visit: Payer: Self-pay

## 2019-01-25 ENCOUNTER — Ambulatory Visit (INDEPENDENT_AMBULATORY_CARE_PROVIDER_SITE_OTHER): Payer: Medicaid Other | Admitting: Obstetrics and Gynecology

## 2019-01-25 ENCOUNTER — Encounter: Payer: Self-pay | Admitting: Obstetrics and Gynecology

## 2019-01-25 VITALS — BP 132/75 | HR 69 | Ht 61.0 in | Wt 190.2 lb

## 2019-01-25 DIAGNOSIS — Z01818 Encounter for other preprocedural examination: Secondary | ICD-10-CM

## 2019-01-25 DIAGNOSIS — Z302 Encounter for sterilization: Secondary | ICD-10-CM | POA: Diagnosis not present

## 2019-01-25 NOTE — Progress Notes (Signed)
Patient ID: Patricia Williamson, female   DOB: 06/05/1985, 33 y.o.   MRN: [id number]  Preoperative History and Physical  Patricia Williamson is a 33 y.o. [id number] here for surgical management of contraception management.  No significant preoperative concerns. Pt has fhx of cervical and breast cancer.  Proposed surgery: laparoscopic  bilateral salingectomy  Past Medical History:  Diagnosis Date  . Asthma   . Carpal tunnel syndrome   . Gall stones   . Hx of constipation   . Nexplanon insertion 10/11/2013   Inserted left arm 10/11/13 remove 5/27 /18   Past Surgical History:  Procedure Laterality Date  . CHOLECYSTECTOMY N/A 04/06/2014   Procedure: LAPAROSCOPIC CHOLECYSTECTOMY;  Surgeon: Jamesetta So, MD;  Location: [facility];  Service: General;  Laterality: N/A;  . CYSTOSCOPY W/ URETERAL STENT PLACEMENT     OB History  Gravida Para Term Preterm AB Living  2 2 1 1   2   SAB TAB Ectopic Multiple Live Births          2    # Outcome Date GA Lbr Len/2nd Weight Sex Delivery Anes PTL Lv  2 Preterm 05/22/13 [redacted]w[redacted]d 01:35 / 02:18 4 lb 11.8 oz (2.15 kg) F Vag-Spont EPI  LIV     Birth Comments: Mom 35 y/o G2P2, GBS + partially treated, Rubella non immune, Good prenatal care. Mom hospitaluzed during pregnancy with cholelithiasis Baby 34 w, NSVD, NICU x 17 days for prematurity and r/o sepsis. Hypotonic. No Resp issues. No phototherapy. Has anemia, on iron. Had murmer of PPS with no need to f/u unless worse. BW 2150, DW 2345.  1 Term 01/02/10 [redacted]w[redacted]d  6 lb 5 oz (2.863 kg) F Vag-Spont EPI  LIV  Patient denies any other pertinent gynecologic issues.   Current Outpatient Medications on File Prior to Visit  Medication Sig Dispense Refill  . diclofenac sodium (VOLTAREN) 1 % GEL Apply 4 g topically 4 (four) times daily. 50 g 0  . etonogestrel (NEXPLANON) 68 MG IMPL implant Inject 1 each into the skin once.    Marland Kitchen ibuprofen (ADVIL) 800 MG tablet Take 1 tablet (800 mg total) by mouth every 8 (eight) hours as needed. 21  tablet 0   No current facility-administered medications on file prior to visit.    Allergies  Allergen Reactions  . Vinegar [Acetic Acid] Hives and Itching  . Sulfa Antibiotics Rash    Social History:   reports that she has been smoking cigarettes. She has a 4.00 pack-year smoking history. She has never used smokeless tobacco. She reports that she does not drink alcohol or use drugs.  Family History  Problem Relation Age of Onset  . Hypertension Mother   . Diabetes Mother   . Hyperlipidemia Mother   . Hypertension Brother   . Diabetes Maternal Grandmother   . Heart disease Maternal Grandmother   . Other Paternal Aunt        benign breast lump    Review of Systems: Noncontributory  PHYSICAL EXAM: There were no vitals taken for this visit. General appearance - alert, well appearing, and in no distress Chest - clear to auscultation, no wheezes, rales or rhonchi, symmetric air entry Heart - normal rate and regular rhythm Abdomen - soft, nontender, nondistended, no masses or organomegaly Pelvic:  VAGINA: good vaginal length CERVIX: normal appearing cervix UTERUS: uterus is normal size, shape, consistency and nontender. GCCHL collected at this time.  Extremities - peripheral pulses normal, no pedal edema, no clubbing or cyanosis  Labs:  No results found for this or any previous visit (from the past 336 hour(s)).  Imaging Studies: Dg Ankle Complete Right  Result Date: 01/15/2019 CLINICAL DATA:  Right foot and ankle pain after injury. Heard a pop. EXAM: RIGHT ANKLE - COMPLETE 3+ VIEW COMPARISON:  None. FINDINGS: There is no evidence of fracture, dislocation, or joint effusion. Plantar calcaneal spur and Achilles tendon enthesophyte. There is no evidence of arthropathy or other focal bone abnormality. Soft tissues are unremarkable. IMPRESSION: 1. No fracture or subluxation of the right ankle. 2. Plantar calcaneal spur and Achilles tendon enthesophyte. Electronically Signed   By:  Narda Rutherford M.D.   On: 01/15/2019 20:27   Ct Abdomen Pelvis W Contrast  Result Date: 12/29/2018 CLINICAL DATA:  Abdominal pain, primarily right lower quadrant. Nausea. EXAM: CT ABDOMEN AND PELVIS WITH CONTRAST TECHNIQUE: Multidetector CT imaging of the abdomen and pelvis was performed using the standard protocol following bolus administration of intravenous contrast. CONTRAST:  OMNIPAQUE IOHEXOL 300 MG/ML  SOLN COMPARISON:  None. FINDINGS: Lower chest: There is mild atelectatic change in the lung bases. There is no edema or consolidation in the lung bases. Hepatobiliary: There is fatty infiltration near the fissure for the ligamentum teres. No focal liver lesions are apparent. The gallbladder is absent. There is no biliary duct dilatation. Pancreas: There is no pancreatic mass or inflammatory focus. Spleen: No splenic lesions are evident. Adrenals/Urinary Tract: Adrenals bilaterally appear normal. Kidneys bilaterally show no evident mass or hydronephrosis on either side. Stomach/Bowel: There is no appreciable bowel wall or mesenteric thickening. No appreciable diverticular disease evident. There is no appreciable bowel obstruction. Terminal ileum appears unremarkable. There is no free air or portal venous air. Vascular/Lymphatic: There is no abdominal aortic aneurysm. No vascular lesions are evident. There is no evident adenopathy in the abdomen or pelvis by size criteria. There are several subcentimeter lymph nodes in the right mid to lower abdomen as well as in the left retroperitoneal region. Reproductive: Uterus is anteverted. There is a presumed dominant follicle arising from the right ovary measuring 2.4 x 2.1 cm. Beyond this apparent follicle, no pelvic masses are evident. Other: Appendix appears unremarkable. No evident abscess or ascites in the abdomen or pelvis. There is a small ventral hernia containing fat but no bowel. Musculoskeletal: No blastic or lytic bone lesions. There are no  intramuscular lesions. IMPRESSION: 1. No evident bowel obstruction. Appendix appears normal. No diverticulitis. No abscess in the abdomen or pelvis. 2. Subcentimeter lymph nodes in the right mid to lower abdomen. In the appropriate clinical setting, these lymph nodes may be indicative of a degree of mesenteric adenitis. No frank adenopathy by size criteria in the abdomen or pelvis evident on this study. 3. No evident renal or ureteral calculus. No hydronephrosis. Urinary bladder wall thickness within normal limits. 4.  Gallbladder absent. Electronically Signed   By: Bretta Bang III M.D.   On: 12/29/2018 15:03   Dg Foot Complete Right  Result Date: 01/15/2019 CLINICAL DATA:  Right foot and ankle pain after injury. Heard a pop. EXAM: RIGHT FOOT COMPLETE - 3+ VIEW COMPARISON:  None. FINDINGS: There is no evidence of fracture or dislocation. Plantar calcaneal spur and Achilles tendon enthesophyte. There is no evidence of arthropathy or other focal bone abnormality. Soft tissues are unremarkable. IMPRESSION: No fracture or subluxation of the right foot. Electronically Signed   By: Narda Rutherford M.D.   On: 01/15/2019 20:28    Assessment: Patient Active Problem List   Diagnosis Date Noted  .  Breakthrough bleeding on Nexplanon 05/06/2016  . Bloody stools 07/25/2015  . Nexplanon insertion 10/11/2013  . Abnormal uterine bleeding (AUB) 08/03/2013  . Asthma exacerbation 02/22/2013  . Bronchitis with asthma, acute 02/22/2013    Plan: Patient will undergo1:  1:surgical management with laparoscopic bilateral salpingectomy, 2: with removal of right thigh skin tag 02/28/2019.   By signing my name below, I, Arnette NorrisMari Johnson, attest that this documentation has been prepared under the direction and in the presence of Tilda BurrowFerguson, Oluwademilade Kellett V, MD. Electronically Signed: Arnette NorrisMari Johnson Medical Scribe. 01/25/19. 11:47 AM.  I personally performed the services described in this documentation, which was SCRIBED in my  presence. The recorded information has been reviewed and considered accurate. It has been edited as necessary during review. Tilda BurrowJohn V Salman Wellen, MD

## 2019-01-28 LAB — GC/CHLAMYDIA PROBE AMP
Chlamydia trachomatis, NAA: NEGATIVE
Neisseria Gonorrhoeae by PCR: NEGATIVE

## 2019-02-05 ENCOUNTER — Other Ambulatory Visit: Payer: Self-pay | Admitting: Obstetrics and Gynecology

## 2019-02-05 NOTE — Progress Notes (Signed)
Patient ID: Patricia Williamson, female   DOB: 03-09-1986, 32 y.o.   MRN: [id number]  Preoperative History and Physical  Patricia Williamson is a 33 y.o. [id number] here for surgical management of contraception management.  No significant preoperative concerns. Pt has fhx of cervical and breast cancer.  Proposed surgery: laparoscopic  bilateral salingectomy      Past Medical History:  Diagnosis Date  . Asthma   . Carpal tunnel syndrome   . Gall stones   . Hx of constipation   . Nexplanon insertion 10/11/2013   Inserted left arm 10/11/13 remove 5/27 /18        Past Surgical History:  Procedure Laterality Date  . CHOLECYSTECTOMY N/A 04/06/2014   Procedure: LAPAROSCOPIC CHOLECYSTECTOMY;  Surgeon: Jamesetta So, MD;  Location: [facility];  Service: General;  Laterality: N/A;  . CYSTOSCOPY W/ URETERAL STENT PLACEMENT                     OB History  Gravida Para Term Preterm AB Living  2 2 1 1   2   SAB TAB Ectopic Multiple Live Births             2       # Outcome Date GA Lbr Len/2nd Weight Sex Delivery Anes PTL Lv  2 Preterm 05/22/13 [redacted]w[redacted]d 01:35 / 02:18 4 lb 11.8 oz (2.15 kg) F Vag-Spont EPI  LIV     Birth Comments: Mom 72 y/o G2P2, GBS + partially treated, Rubella non immune, Good prenatal care. Mom hospitaluzed during pregnancy with cholelithiasis Baby 34 w, NSVD, NICU x 17 days for prematurity and r/o sepsis. Hypotonic. No Resp issues. No phototherapy. Has anemia, on iron. Had murmer of PPS with no need to f/u unless worse. BW 2150, DW 2345.  1 Term 01/02/10 [redacted]w[redacted]d  6 lb 5 oz (2.863 kg) F Vag-Spont EPI  LIV  Patient denies any other pertinent gynecologic issues.         Current Outpatient Medications on File Prior to Visit  Medication Sig Dispense Refill  . diclofenac sodium (VOLTAREN) 1 % GEL Apply 4 g topically 4 (four) times daily. 50 g 0  . etonogestrel (NEXPLANON) 68 MG IMPL implant Inject 1 each into the skin once.    Marland Kitchen ibuprofen (ADVIL) 800 MG tablet Take 1 tablet  (800 mg total) by mouth every 8 (eight) hours as needed. 21 tablet 0   No current facility-administered medications on file prior to visit.        Allergies  Allergen Reactions  . Vinegar [Acetic Acid] Hives and Itching  . Sulfa Antibiotics Rash    Social History:   reports that she has been smoking cigarettes. She has a 4.00 pack-year smoking history. She has never used smokeless tobacco. She reports that she does not drink alcohol or use drugs.       Family History  Problem Relation Age of Onset  . Hypertension Mother   . Diabetes Mother   . Hyperlipidemia Mother   . Hypertension Brother   . Diabetes Maternal Grandmother   . Heart disease Maternal Grandmother   . Other Paternal Aunt        benign breast lump    Review of Systems: Noncontributory  PHYSICAL EXAM: There were no vitals taken for this visit. General appearance - alert, well appearing, and in no distress Chest - clear to auscultation, no wheezes, rales or rhonchi, symmetric air entry Heart - normal rate and regular rhythm Abdomen -  soft, nontender, nondistended, no masses or organomegaly Pelvic:  VAGINA: good vaginal length CERVIX: normal appearing cervix UTERUS: uterus is normal size, shape, consistency and nontender. GCCHL collected at this time.  Extremities - peripheral pulses normal, no pedal edema, no clubbing or cyanosis  Labs: No results found for this or any previous visit (from the past 336 hour(s)).  Imaging Studies:  Imaging Results  Dg Ankle Complete Right  Result Date: 01/15/2019 CLINICAL DATA:  Right foot and ankle pain after injury. Heard a pop. EXAM: RIGHT ANKLE - COMPLETE 3+ VIEW COMPARISON:  None. FINDINGS: There is no evidence of fracture, dislocation, or joint effusion. Plantar calcaneal spur and Achilles tendon enthesophyte. There is no evidence of arthropathy or other focal bone abnormality. Soft tissues are unremarkable. IMPRESSION: 1. No fracture or subluxation  of the right ankle. 2. Plantar calcaneal spur and Achilles tendon enthesophyte. Electronically Signed   By: Narda RutherfordMelanie  Sanford M.D.   On: 01/15/2019 20:27   Ct Abdomen Pelvis W Contrast  Result Date: 12/29/2018 CLINICAL DATA:  Abdominal pain, primarily right lower quadrant. Nausea. EXAM: CT ABDOMEN AND PELVIS WITH CONTRAST TECHNIQUE: Multidetector CT imaging of the abdomen and pelvis was performed using the standard protocol following bolus administration of intravenous contrast. CONTRAST:  100mL OMNIPAQUE IOHEXOL 300 MG/ML  SOLN COMPARISON:  None. FINDINGS: Lower chest: There is mild atelectatic change in the lung bases. There is no edema or consolidation in the lung bases. Hepatobiliary: There is fatty infiltration near the fissure for the ligamentum teres. No focal liver lesions are apparent. The gallbladder is absent. There is no biliary duct dilatation. Pancreas: There is no pancreatic mass or inflammatory focus. Spleen: No splenic lesions are evident. Adrenals/Urinary Tract: Adrenals bilaterally appear normal. Kidneys bilaterally show no evident mass or hydronephrosis on either side. Stomach/Bowel: There is no appreciable bowel wall or mesenteric thickening. No appreciable diverticular disease evident. There is no appreciable bowel obstruction. Terminal ileum appears unremarkable. There is no free air or portal venous air. Vascular/Lymphatic: There is no abdominal aortic aneurysm. No vascular lesions are evident. There is no evident adenopathy in the abdomen or pelvis by size criteria. There are several subcentimeter lymph nodes in the right mid to lower abdomen as well as in the left retroperitoneal region. Reproductive: Uterus is anteverted. There is a presumed dominant follicle arising from the right ovary measuring 2.4 x 2.1 cm. Beyond this apparent follicle, no pelvic masses are evident. Other: Appendix appears unremarkable. No evident abscess or ascites in the abdomen or pelvis. There is a small  ventral hernia containing fat but no bowel. Musculoskeletal: No blastic or lytic bone lesions. There are no intramuscular lesions. IMPRESSION: 1. No evident bowel obstruction. Appendix appears normal. No diverticulitis. No abscess in the abdomen or pelvis. 2. Subcentimeter lymph nodes in the right mid to lower abdomen. In the appropriate clinical setting, these lymph nodes may be indicative of a degree of mesenteric adenitis. No frank adenopathy by size criteria in the abdomen or pelvis evident on this study. 3. No evident renal or ureteral calculus. No hydronephrosis. Urinary bladder wall thickness within normal limits. 4.  Gallbladder absent. Electronically Signed   By: Bretta BangWilliam  Woodruff III M.D.   On: 12/29/2018 15:03   Dg Foot Complete Right  Result Date: 01/15/2019 CLINICAL DATA:  Right foot and ankle pain after injury. Heard a pop. EXAM: RIGHT FOOT COMPLETE - 3+ VIEW COMPARISON:  None. FINDINGS: There is no evidence of fracture or dislocation. Plantar calcaneal spur and Achilles tendon enthesophyte. There  is no evidence of arthropathy or other focal bone abnormality. Soft tissues are unremarkable. IMPRESSION: No fracture or subluxation of the right foot. Electronically Signed   By: Narda Rutherford M.D.   On: 01/15/2019 20:28     Assessment:     Patient Active Problem List   Diagnosis Date Noted  . Breakthrough bleeding on Nexplanon 05/06/2016  . Bloody stools 07/25/2015  . Nexplanon insertion 10/11/2013  . Abnormal uterine bleeding (AUB) 08/03/2013  . Asthma exacerbation 02/22/2013  . Bronchitis with asthma, acute 02/22/2013    Plan: Patient will undergo1:  1:surgical management with laparoscopic bilateral salpingectomy, 2: with removal of right thigh skin tag 02/28/2019.   By signing my name below, I, Arnette Norris, attest that this documentation has been prepared under the direction and in the presence of Tilda Burrow, MD. Electronically Signed: Arnette Norris Medical Scribe.  01/25/19. 11:47 AM.  I personally performed the services described in this documentation, which was SCRIBED in my presence. The recorded information has been reviewed and considered accurate. It has been edited as necessary during review. Tilda Burrow, MD

## 2019-02-23 ENCOUNTER — Other Ambulatory Visit: Payer: Self-pay | Admitting: Obstetrics and Gynecology

## 2019-02-23 NOTE — Patient Instructions (Signed)
Patricia Williamson  02/23/2019     @   Your procedure is scheduled on  02/28/2019.  Report to [facility] at 0830  A.M.  Call this number if you have problems the morning of surgery:  [phone]   Remember:  Do not eat or drink after midnight.                         Take these medicines the morning of surgery with A SIP OF WATER    None    Do not wear jewelry, make-up or nail polish.  Do not wear lotions, powders, or perfumes.Please wear deodorant and brush your teeth.  Do not shave 48 hours prior to surgery.  Men may shave face and neck.  Do not bring valuables to the hospital.  [facility] is not responsible for any belongings or valuables.  Contacts, dentures or bridgework may not be worn into surgery.  Leave your suitcase in the car.  After surgery it may be brought to your room.  For patients admitted to the hospital, discharge time will be determined by your treatment team.  Patients discharged the day of surgery will not be allowed to drive home.   Name and phone number of your driver:   family Special instructions:  None  Please read over the following fact sheets that you were given. Anesthesia Post-op Instructions and Care and Recovery After Surgery       Laparoscopic Tubal Ligation, Care After This sheet gives you information about how to care for yourself after your procedure. Your health care provider may also give you more specific instructions. If you have problems or questions, contact your health care provider. What can I expect after the procedure? After the procedure, it is common to have:  A sore throat.  Discomfort in your shoulder.  Mild discomfort or cramping in your abdomen.  Gas pains.  Pain or soreness in the area where the surgical incision was made.  A bloated feeling.  Tiredness.  Nausea.  Vomiting. Follow these instructions at home: Medicines  Take over-the-counter and prescription medicines  only as told by your health care provider.  Do not take aspirin because it can cause bleeding.  Ask your health care provider if the medicine prescribed to you: ? Requires you to avoid driving or using heavy machinery. ? Can cause constipation. You may need to take actions to prevent or treat constipation, such as:  Drink enough fluid to keep your urine pale yellow.  Take over-the-counter or prescription medicines.  Eat foods that are high in fiber, such as beans, whole grains, and fresh fruits and vegetables.  Limit foods that are high in fat and processed sugars, such as fried or sweet foods. Incision care      Follow instructions from your health care provider about how to take care of your incision. Make sure you: ? Wash your hands with soap and water before and after you change your bandage (dressing). If soap and water are not available, use hand sanitizer. ? Change your dressing as told by your health care provider. ? Leave stitches (sutures), skin glue, or adhesive strips in place. These skin closures may need to stay in place for 2 weeks or longer. If adhesive strip edges start to loosen and curl up, you may trim the loose edges. Do not remove adhesive strips completely unless your health care provider tells you to do  that.  Check your incision area every day for signs of infection. Check for: ? Redness, swelling, or pain. ? Fluid or blood. ? Warmth. ? Pus or a bad smell. Activity  Rest as told by your health care provider.  Avoid sitting for a long time without moving. Get up to take short walks every 1-2 hours. This is important to improve blood flow and breathing. Ask for help if you feel weak or unsteady.  Return to your normal activities as told by your health care provider. Ask your health care provider what activities are safe for you. General instructions  Do not take baths, swim, or use a hot tub until your health care provider approves. Ask your health care  provider if you may take showers. You may only be allowed to take sponge baths.  Have someone help you with your daily household tasks for the first few days.  Keep all follow-up visits as told by your health care provider. This is important. Contact a health care provider if:  You have redness, swelling, or pain around your incision.  Your incision feels warm to the touch.  You have pus or a bad smell coming from your incision.  The edges of your incision break open after the sutures have been removed.  Your pain does not improve after 2-3 days.  You have a rash.  You repeatedly become dizzy or light-headed.  Your pain medicine is not helping. Get help right away if you:  Have a fever.  Faint.  Have increasing pain in your abdomen.  Have severe pain in one or both of your shoulders.  Have fluid or blood coming from your sutures or from your vagina.  Have shortness of breath or difficulty breathing.  Have chest pain or leg pain.  Have ongoing nausea, vomiting, or diarrhea. Summary  After the procedure, it is common to have mild discomfort or cramping in your abdomen.  Take over-the-counter and prescription medicines only as told by your health care provider.  Watch for symptoms that should prompt you to call your health care provider.  Keep all follow-up visits as told by your health care provider. This is important. This information is not intended to replace advice given to you by your health care provider. Make sure you discuss any questions you have with your health care provider. Document Released: 11/21/2004 Document Revised: 03/29/2018 Document Reviewed: 03/29/2018 Elsevier Patient Education  2020 [employer] Anesthesia, Adult, Care After This sheet gives you information about how to care for yourself after your procedure. Your health care provider may also give you more specific instructions. If you have problems or questions, contact your  health care provider. What can I expect after the procedure? After the procedure, the following side effects are common:  Pain or discomfort at the IV site.  Nausea.  Vomiting.  Sore throat.  Trouble concentrating.  Feeling cold or chills.  Weak or tired.  Sleepiness and fatigue.  Soreness and body aches. These side effects can affect parts of the body that were not involved in surgery. Follow these instructions at home:  For at least 24 hours after the procedure:  Have a responsible adult stay with you. It is important to have someone help care for you until you are awake and alert.  Rest as needed.  Do not: ? Participate in activities in which you could fall or become injured. ? Drive. ? Use heavy machinery. ? Drink alcohol. ? Take sleeping pills or medicines  that cause drowsiness. ? Make important decisions or sign legal documents. ? Take care of children on your own. Eating and drinking  Follow any instructions from your health care provider about eating or drinking restrictions.  When you feel hungry, start by eating small amounts of foods that are soft and easy to digest (bland), such as toast. Gradually return to your regular diet.  Drink enough fluid to keep your urine pale yellow.  If you vomit, rehydrate by drinking water, juice, or clear broth. General instructions  If you have sleep apnea, surgery and certain medicines can increase your risk for breathing problems. Follow instructions from your health care provider about wearing your sleep device: ? Anytime you are sleeping, including during daytime naps. ? While taking prescription pain medicines, sleeping medicines, or medicines that make you drowsy.  Return to your normal activities as told by your health care provider. Ask your health care provider what activities are safe for you.  Take over-the-counter and prescription medicines only as told by your health care provider.  If you smoke, do not  smoke without supervision.  Keep all follow-up visits as told by your health care provider. This is important. Contact a health care provider if:  You have nausea or vomiting that does not get better with medicine.  You cannot eat or drink without vomiting.  You have pain that does not get better with medicine.  You are unable to pass urine.  You develop a skin rash.  You have a fever.  You have redness around your IV site that gets worse. Get help right away if:  You have difficulty breathing.  You have chest pain.  You have blood in your urine or stool, or you vomit blood. Summary  After the procedure, it is common to have a sore throat or nausea. It is also common to feel tired.  Have a responsible adult stay with you for the first 24 hours after general anesthesia. It is important to have someone help care for you until you are awake and alert.  When you feel hungry, start by eating small amounts of foods that are soft and easy to digest (bland), such as toast. Gradually return to your regular diet.  Drink enough fluid to keep your urine pale yellow.  Return to your normal activities as told by your health care provider. Ask your health care provider what activities are safe for you. This information is not intended to replace advice given to you by your health care provider. Make sure you discuss any questions you have with your health care provider. Document Released: 08/10/2000 Document Revised: 05/07/2017 Document Reviewed: 12/18/2016 Elsevier Patient Education  2020 [employer] How to Use Chlorhexidine for Bathing Chlorhexidine gluconate (CHG) is a germ-killing (antiseptic) solution that is used to clean the skin. It can get rid of the bacteria that normally live on the skin and can keep them away for about 24 hours. To clean your skin with CHG, you may be given:  A CHG solution to use in the shower or as part of a sponge bath.  A prepackaged cloth that  contains CHG. Cleaning your skin with CHG may help lower the risk for infection:  While you are staying in the intensive care unit of the hospital.  If you have a vascular access, such as a central line, to provide short-term or long-term access to your veins.  If you have a catheter to drain urine from your bladder.  If you are  on a ventilator. A ventilator is a machine that helps you breathe by moving air in and out of your lungs.  After surgery. What are the risks? Risks of using CHG include:  A skin reaction.  Hearing loss, if CHG gets in your ears.  Eye injury, if CHG gets in your eyes and is not rinsed out.  The CHG product catching fire. Make sure that you avoid smoking and flames after applying CHG to your skin. Do not use CHG:  If you have a chlorhexidine allergy or have previously reacted to chlorhexidine.  On babies younger than 15 months of age. How to use CHG solution  Use CHG only as told by your health care provider, and follow the instructions on the label.  Use the full amount of CHG as directed. Usually, this is one bottle. During a shower Follow these steps when using CHG solution during a shower (unless your health care provider gives you different instructions): 1. Start the shower. 2. Use your normal soap and shampoo to wash your face and hair. 3. Turn off the shower or move out of the shower stream. 4. Pour the CHG onto a clean washcloth. Do not use any type of brush or rough-edged sponge. 5. Starting at your neck, lather your body down to your toes. Make sure you follow these instructions: ? If you will be having surgery, pay special attention to the part of your body where you will be having surgery. Scrub this area for at least 1 minute. ? Do not use CHG on your head or face. If the solution gets into your ears or eyes, rinse them well with water. ? Avoid your genital area. ? Avoid any areas of skin that have broken skin, cuts, or scrapes. ? Scrub  your back and under your arms. Make sure to wash skin folds. 6. Let the lather sit on your skin for 1-2 minutes or as long as told by your health care provider. 7. Thoroughly rinse your entire body in the shower. Make sure that all body creases and crevices are rinsed well. 8. Dry off with a clean towel. Do not put any substances on your body afterward-such as powder, lotion, or perfume-unless you are told to do so by your health care provider. Only use lotions that are recommended by the manufacturer. 9. Put on clean clothes or pajamas. 10. If it is the night before your surgery, sleep in clean sheets.  During a sponge bath Follow these steps when using CHG solution during a sponge bath (unless your health care provider gives you different instructions): 1. Use your normal soap and shampoo to wash your face and hair. 2. Pour the CHG onto a clean washcloth. 3. Starting at your neck, lather your body down to your toes. Make sure you follow these instructions: ? If you will be having surgery, pay special attention to the part of your body where you will be having surgery. Scrub this area for at least 1 minute. ? Do not use CHG on your head or face. If the solution gets into your ears or eyes, rinse them well with water. ? Avoid your genital area. ? Avoid any areas of skin that have broken skin, cuts, or scrapes. ? Scrub your back and under your arms. Make sure to wash skin folds. 4. Let the lather sit on your skin for 1-2 minutes or as long as told by your health care provider. 5. Using a different clean, wet washcloth, thoroughly rinse your  entire body. Make sure that all body creases and crevices are rinsed well. 6. Dry off with a clean towel. Do not put any substances on your body afterward-such as powder, lotion, or perfume-unless you are told to do so by your health care provider. Only use lotions that are recommended by the manufacturer. 7. Put on clean clothes or pajamas. 8. If it is the  night before your surgery, sleep in clean sheets. How to use CHG prepackaged cloths  Only use CHG cloths as told by your health care provider, and follow the instructions on the label.  Use the CHG cloth on clean, dry skin.  Do not use the CHG cloth on your head or face unless your health care provider tells you to.  When washing with the CHG cloth: ? Avoid your genital area. ? Avoid any areas of skin that have broken skin, cuts, or scrapes. Before surgery Follow these steps when using a CHG cloth to clean before surgery (unless your health care provider gives you different instructions): 1. Using the CHG cloth, vigorously scrub the part of your body where you will be having surgery. Scrub using a back-and-forth motion for 3 minutes. The area on your body should be completely wet with CHG when you are done scrubbing. 2. Do not rinse. Discard the cloth and let the area air-dry. Do not put any substances on the area afterward, such as powder, lotion, or perfume. 3. Put on clean clothes or pajamas. 4. If it is the night before your surgery, sleep in clean sheets.  For general bathing Follow these steps when using CHG cloths for general bathing (unless your health care provider gives you different instructions). 1. Use a separate CHG cloth for each area of your body. Make sure you wash between any folds of skin and between your fingers and toes. Wash your body in the following order, switching to a new cloth after each step: ? The front of your neck, shoulders, and chest. ? Both of your arms, under your arms, and your hands. ? Your stomach and groin area, avoiding the genitals. ? Your right leg and foot. ? Your left leg and foot. ? The back of your neck, your back, and your buttocks. 2. Do not rinse. Discard the cloth and let the area air-dry. Do not put any substances on your body afterward-such as powder, lotion, or perfume-unless you are told to do so by your health care provider. Only use  lotions that are recommended by the manufacturer. 3. Put on clean clothes or pajamas. Contact a health care provider if:  Your skin gets irritated after scrubbing.  You have questions about using your solution or cloth. Get help right away if:  Your eyes become very red or swollen.  Your eyes itch badly.  Your skin itches badly and is red or swollen.  Your hearing changes.  You have trouble seeing.  You have swelling or tingling in your mouth or throat.  You have trouble breathing.  You swallow any chlorhexidine. Summary  Chlorhexidine gluconate (CHG) is a germ-killing (antiseptic) solution that is used to clean the skin. Cleaning your skin with CHG may help to lower your risk for infection.  You may be given CHG to use for bathing. It may be in a bottle or in a prepackaged cloth to use on your skin. Carefully follow your health care provider's instructions and the instructions on the product label.  Do not use CHG if you have a chlorhexidine allergy.  Contact your health care provider if your skin gets irritated after scrubbing. This information is not intended to replace advice given to you by your health care provider. Make sure you discuss any questions you have with your health care provider. Document Released: 01/27/2012 Document Revised: 07/21/2018 Document Reviewed: 04/01/2017 Elsevier Patient Education  2020 [employer].

## 2019-02-24 ENCOUNTER — Encounter (HOSPITAL_COMMUNITY)
Admission: RE | Admit: 2019-02-24 | Discharge: 2019-02-24 | Disposition: A | Discharge status: Home / Self Care | Payer: Medicaid Other | Source: Ambulatory Visit | Attending: Obstetrics and Gynecology | Admitting: Obstetrics and Gynecology

## 2019-02-24 ENCOUNTER — Encounter (HOSPITAL_COMMUNITY): Payer: Self-pay

## 2019-02-24 ENCOUNTER — Other Ambulatory Visit (HOSPITAL_COMMUNITY)
Admission: RE | Admit: 2019-02-24 | Discharge: 2019-02-24 | Disposition: A | Discharge status: Home / Self Care | Payer: Medicaid Other | Source: Ambulatory Visit | Attending: Obstetrics and Gynecology | Admitting: Obstetrics and Gynecology

## 2019-02-24 ENCOUNTER — Other Ambulatory Visit: Payer: Self-pay

## 2019-02-24 DIAGNOSIS — Z01812 Encounter for preprocedural laboratory examination: Secondary | ICD-10-CM | POA: Insufficient documentation

## 2019-02-24 DIAGNOSIS — Z20828 Contact with and (suspected) exposure to other viral communicable diseases: Secondary | ICD-10-CM | POA: Diagnosis not present

## 2019-02-24 LAB — SARS CORONAVIRUS 2 (TAT 6-24 HRS): SARS Coronavirus 2: NEGATIVE

## 2019-02-24 LAB — HCG, SERUM, QUALITATIVE: Preg, Serum: NEGATIVE

## 2019-02-24 LAB — CBC
HCT: 49.9 % — ABNORMAL HIGH (ref 36.0–46.0)
Hemoglobin: 16.6 g/dL — ABNORMAL HIGH (ref 12.0–15.0)
MCH: 30.1 pg (ref 26.0–34.0)
MCHC: 33.3 g/dL (ref 30.0–36.0)
MCV: 90.4 fL (ref 80.0–100.0)
Platelets: 213 10*3/uL (ref 150–400)
RBC: 5.52 MIL/uL — ABNORMAL HIGH (ref 3.87–5.11)
RDW: 12.9 % (ref 11.5–15.5)
WBC: 8.1 10*3/uL (ref 4.0–10.5)
nRBC: 0 % (ref 0.0–0.2)

## 2019-02-24 LAB — COMPREHENSIVE METABOLIC PANEL
ALT: 16 U/L (ref 0–44)
AST: 18 U/L (ref 15–41)
Albumin: 4.2 g/dL (ref 3.5–5.0)
Alkaline Phosphatase: 52 U/L (ref 38–126)
Anion gap: 6 (ref 5–15)
BUN: 7 mg/dL (ref 6–20)
CO2: 21 mmol/L — ABNORMAL LOW (ref 22–32)
Calcium: 8.8 mg/dL — ABNORMAL LOW (ref 8.9–10.3)
Chloride: 111 mmol/L (ref 98–111)
Creatinine, Ser: 0.58 mg/dL (ref 0.44–1.00)
GFR calc Af Amer: >60 mL/min (ref >60)
GFR calc non Af Amer: >60 mL/min (ref >60)
Glucose, Bld: 99 mg/dL (ref 70–99)
Potassium: 3.8 mmol/L (ref 3.5–5.1)
Sodium: 138 mmol/L (ref 135–145)
Total Bilirubin: 0.3 mg/dL (ref 0.3–1.2)
Total Protein: 7.5 g/dL (ref 6.5–8.1)

## 2019-02-28 ENCOUNTER — Ambulatory Visit (HOSPITAL_COMMUNITY): Payer: Medicaid Other | Admitting: Anesthesiology

## 2019-02-28 ENCOUNTER — Ambulatory Visit (HOSPITAL_COMMUNITY)
Admission: RE | Admit: 2019-02-28 | Discharge: 2019-02-28 | Disposition: A | Discharge status: Home / Self Care | Payer: Medicaid Other | Attending: Obstetrics and Gynecology | Admitting: Obstetrics and Gynecology

## 2019-02-28 ENCOUNTER — Encounter (HOSPITAL_COMMUNITY)
Admission: RE | Disposition: A | Discharge status: Home / Self Care | Payer: Self-pay | Source: Home / Self Care | Attending: Obstetrics and Gynecology

## 2019-02-28 ENCOUNTER — Encounter (HOSPITAL_COMMUNITY): Payer: Self-pay | Admitting: *Deleted

## 2019-02-28 DIAGNOSIS — N838 Other noninflammatory disorders of ovary, fallopian tube and broad ligament: Secondary | ICD-10-CM | POA: Diagnosis not present

## 2019-02-28 DIAGNOSIS — L918 Other hypertrophic disorders of the skin: Secondary | ICD-10-CM | POA: Diagnosis not present

## 2019-02-28 DIAGNOSIS — Z302 Encounter for sterilization: Secondary | ICD-10-CM | POA: Diagnosis present

## 2019-02-28 DIAGNOSIS — F17210 Nicotine dependence, cigarettes, uncomplicated: Secondary | ICD-10-CM | POA: Insufficient documentation

## 2019-02-28 DIAGNOSIS — Z3046 Encounter for surveillance of implantable subdermal contraceptive: Secondary | ICD-10-CM | POA: Diagnosis not present

## 2019-02-28 HISTORY — PX: EXCISION OF SKIN TAG: SHX6270

## 2019-02-28 HISTORY — PX: NORPLANT REMOVAL: SHX5385

## 2019-02-28 HISTORY — PX: LAPAROSCOPIC BILATERAL SALPINGECTOMY: SHX5889

## 2019-02-28 SURGERY — SALPINGECTOMY, BILATERAL, LAPAROSCOPIC
Anesthesia: General | Site: Groin | Laterality: Right

## 2019-02-28 MED ORDER — ROCURONIUM BROMIDE 10 MG/ML (PF) SYRINGE
PREFILLED_SYRINGE | INTRAVENOUS | Status: DC | PRN
  Administered 2019-02-28: 40 mg via INTRAVENOUS

## 2019-02-28 MED ORDER — SUGAMMADEX SODIUM 200 MG/2ML IV SOLN
INTRAVENOUS | Status: DC | PRN
  Administered 2019-02-28: 200 mg via INTRAVENOUS

## 2019-02-28 MED ORDER — PROPOFOL 10 MG/ML IV BOLUS
INTRAVENOUS | Status: DC | PRN
  Administered 2019-02-28: 150 mg via INTRAVENOUS

## 2019-02-28 MED ORDER — PROPOFOL 10 MG/ML IV BOLUS
INTRAVENOUS | Status: AC
  Filled 2019-02-28: qty 40

## 2019-02-28 MED ORDER — LACTATED RINGERS IV SOLN
INTRAVENOUS | Status: DC
  Administered 2019-02-28: 1000 mL via INTRAVENOUS

## 2019-02-28 MED ORDER — LIDOCAINE 2% (20 MG/ML) 5 ML SYRINGE
INTRAMUSCULAR | Status: DC | PRN
  Administered 2019-02-28: 60 mg via INTRAVENOUS

## 2019-02-28 MED ORDER — MIDAZOLAM HCL 2 MG/2ML IJ SOLN
INTRAMUSCULAR | Status: AC
  Filled 2019-02-28: qty 2

## 2019-02-28 MED ORDER — MIDAZOLAM HCL 2 MG/2ML IJ SOLN: 0.5000 mg | Freq: Once | INTRAMUSCULAR | Status: DC | PRN

## 2019-02-28 MED ORDER — ONDANSETRON HCL 4 MG/2ML IJ SOLN
INTRAMUSCULAR | Status: AC
  Filled 2019-02-28: qty 2

## 2019-02-28 MED ORDER — ROCURONIUM BROMIDE 10 MG/ML (PF) SYRINGE
PREFILLED_SYRINGE | INTRAVENOUS | Status: AC
  Filled 2019-02-28: qty 10

## 2019-02-28 MED ORDER — DEXAMETHASONE SODIUM PHOSPHATE 10 MG/ML IJ SOLN
INTRAMUSCULAR | Status: DC | PRN
  Administered 2019-02-28: 10 mg via INTRAVENOUS

## 2019-02-28 MED ORDER — LIDOCAINE 2% (20 MG/ML) 5 ML SYRINGE
INTRAMUSCULAR | Status: AC
  Filled 2019-02-28: qty 5

## 2019-02-28 MED ORDER — MIDAZOLAM HCL 5 MG/5ML IJ SOLN
INTRAMUSCULAR | Status: DC | PRN
  Administered 2019-02-28 (×2): 1 mg via INTRAVENOUS

## 2019-02-28 MED ORDER — HYDROMORPHONE HCL 1 MG/ML IJ SOLN
0.2500 mg | INTRAMUSCULAR | Status: DC | PRN
  Administered 2019-02-28: 0.5 mg via INTRAVENOUS
  Filled 2019-02-28: qty 0.5

## 2019-02-28 MED ORDER — HYDROCODONE-ACETAMINOPHEN 7.5-325 MG PO TABS
1.0000 | ORAL_TABLET | Freq: Once | ORAL | Status: AC | PRN
  Administered 2019-02-28: 1 via ORAL
  Filled 2019-02-28: qty 1

## 2019-02-28 MED ORDER — DEXAMETHASONE SODIUM PHOSPHATE 10 MG/ML IJ SOLN
INTRAMUSCULAR | Status: AC
  Filled 2019-02-28: qty 1

## 2019-02-28 MED ORDER — KETOROLAC TROMETHAMINE 30 MG/ML IJ SOLN
INTRAMUSCULAR | Status: DC | PRN
  Administered 2019-02-28: 30 mg via INTRAVENOUS

## 2019-02-28 MED ORDER — FENTANYL CITRATE (PF) 100 MCG/2ML IJ SOLN
INTRAMUSCULAR | Status: DC | PRN
  Administered 2019-02-28: 50 ug via INTRAVENOUS
  Administered 2019-02-28 (×2): 100 ug via INTRAVENOUS

## 2019-02-28 MED ORDER — PROMETHAZINE HCL 25 MG/ML IJ SOLN: 6.2500 mg | INTRAMUSCULAR | Status: DC | PRN

## 2019-02-28 MED ORDER — FENTANYL CITRATE (PF) 250 MCG/5ML IJ SOLN
INTRAMUSCULAR | Status: AC
  Filled 2019-02-28: qty 5

## 2019-02-28 MED ORDER — HYDROCODONE-ACETAMINOPHEN 5-325 MG PO TABS: 1.0000 | ORAL_TABLET | Freq: Four times a day (QID) | ORAL | 0 refills | Status: DC | PRN

## 2019-02-28 MED ORDER — KETOROLAC TROMETHAMINE 30 MG/ML IJ SOLN
INTRAMUSCULAR | Status: AC
  Filled 2019-02-28: qty 1

## 2019-02-28 MED ORDER — BUPIVACAINE-EPINEPHRINE 0.5% -1:200000 IJ SOLN
INTRAMUSCULAR | Status: DC | PRN
  Administered 2019-02-28: 6 mL
  Administered 2019-02-28: 2 mL

## 2019-02-28 MED ORDER — BUPIVACAINE-EPINEPHRINE (PF) 0.5% -1:200000 IJ SOLN
INTRAMUSCULAR | Status: AC
  Filled 2019-02-28: qty 30

## 2019-02-28 MED ORDER — 0.9 % SODIUM CHLORIDE (POUR BTL) OPTIME
TOPICAL | Status: DC | PRN
  Administered 2019-02-28: 1000 mL

## 2019-02-28 MED ORDER — KETOROLAC TROMETHAMINE 30 MG/ML IJ SOLN: INTRAMUSCULAR | Status: DC | PRN

## 2019-02-28 MED ORDER — ONDANSETRON HCL 4 MG/2ML IJ SOLN
INTRAMUSCULAR | Status: DC | PRN
  Administered 2019-02-28: 4 mg via INTRAVENOUS

## 2019-02-28 SURGICAL SUPPLY — 47 items
APL SKNCLS STERI-STRIP NONHPOA (GAUZE/BANDAGES/DRESSINGS) ×6
BAG RETRIEVAL 10MM (BASKET) ×1
BENZOIN TINCTURE PRP APPL 2/3 (GAUZE/BANDAGES/DRESSINGS) ×7 IMPLANT
BLADE SURG SZ11 CARB STEEL (BLADE) ×5 IMPLANT
BNDG ADH 1X3 FABRIC TAN LF (GAUZE/BANDAGES/DRESSINGS) ×8 IMPLANT
BNDG ADH 3X1 STRCH TAN LF (GAUZE/BANDAGES/DRESSINGS) ×12
CATH ROBINSON RED A/P 16FR (CATHETERS) IMPLANT
CLOSURE STERI-STRIP 1/4X4 (GAUZE/BANDAGES/DRESSINGS) ×5 IMPLANT
CLOSURE WOUND 1/4 X3 (GAUZE/BANDAGES/DRESSINGS) ×2
CLOTH BEACON ORANGE TIMEOUT ST (SAFETY) ×5 IMPLANT
COVER LIGHT HANDLE STERIS (MISCELLANEOUS) ×10 IMPLANT
COVER WAND RF STERILE (DRAPES) ×5 IMPLANT
DECANTER SPIKE VIAL GLASS SM (MISCELLANEOUS) ×5 IMPLANT
DURAPREP 26ML APPLICATOR (WOUND CARE) ×5 IMPLANT
ELECT REM PT RETURN 9FT ADLT (ELECTROSURGICAL) ×5
ELECTRODE REM PT RTRN 9FT ADLT (ELECTROSURGICAL) ×3 IMPLANT
GLOVE BIOGEL PI IND STRL 7.0 (GLOVE) ×9 IMPLANT
GLOVE BIOGEL PI IND STRL 9 (GLOVE) ×3 IMPLANT
GLOVE BIOGEL PI INDICATOR 7.0 (GLOVE) ×6
GLOVE BIOGEL PI INDICATOR 9 (GLOVE) ×2
GLOVE ECLIPSE 9.0 STRL (GLOVE) ×10 IMPLANT
GOWN SPEC L3 XXLG W/TWL (GOWN DISPOSABLE) ×5 IMPLANT
GOWN STRL REUS W/TWL LRG LVL3 (GOWN DISPOSABLE) ×5 IMPLANT
INST SET LAPROSCOPIC GYN AP (KITS) ×5 IMPLANT
KIT TURNOVER KIT A (KITS) ×5 IMPLANT
NDL HYPO 25X1 1.5 SAFETY (NEEDLE) ×3 IMPLANT
NEEDLE HYPO 25X1 1.5 SAFETY (NEEDLE) ×5 IMPLANT
NEEDLE INSUFFLATION 120MM (ENDOMECHANICALS) ×5 IMPLANT
NS IRRIG 1000ML POUR BTL (IV SOLUTION) ×5 IMPLANT
PACK PERI GYN (CUSTOM PROCEDURE TRAY) ×5 IMPLANT
PAD ARMBOARD 7.5X6 YLW CONV (MISCELLANEOUS) ×5 IMPLANT
SET BASIN LINEN APH (SET/KITS/TRAYS/PACK) ×5 IMPLANT
SET TUBE SMOKE EVAC HIGH FLOW (TUBING) ×5 IMPLANT
SHEARS HARMONIC ACE PLUS 36CM (ENDOMECHANICALS) ×5 IMPLANT
SLEEVE ENDOPATH XCEL 5M (ENDOMECHANICALS) ×5 IMPLANT
SOLUTION ANTI FOG 6CC (MISCELLANEOUS) ×5 IMPLANT
STRIP CLOSURE SKIN 1/4X3 (GAUZE/BANDAGES/DRESSINGS) ×2 IMPLANT
SUT VIC AB 4-0 PS2 27 (SUTURE) ×5 IMPLANT
SUT VICRYL 0 UR6 27IN ABS (SUTURE) ×5 IMPLANT
SYR 10ML LL (SYRINGE) ×5 IMPLANT
SYR BULB IRRIGATION 50ML (SYRINGE) ×5 IMPLANT
SYR CONTROL 10ML LL (SYRINGE) ×5 IMPLANT
SYS BAG RETRIEVAL 10MM (BASKET) ×4
SYSTEM BAG RETRIEVAL 10MM (BASKET) IMPLANT
TROCAR ENDO BLADELESS 11MM (ENDOMECHANICALS) ×5 IMPLANT
TROCAR XCEL NON-BLD 5MMX100MML (ENDOMECHANICALS) ×5 IMPLANT
WARMER LAPAROSCOPE (MISCELLANEOUS) ×5 IMPLANT

## 2019-02-28 NOTE — Op Note (Signed)
02/28/2019  8:55 AM  PATIENT:  Patricia Williamson  33 y.o. female  PRE-OPERATIVE DIAGNOSIS: Elective permanent sterilization, Nexplanon removal, removal of skin tag right gluteal area  POST-OPERATIVE DIAGNOSIS:  request for permanent sterilization, patient request removal of skin tag right gluteal area, Nexplanon removal  PROCEDURE:  Procedure(s): LAPAROSCOPIC BILATERAL SALPINGECTOMY (Bilateral) EXCISION OF GLUTEAL SKIN TAG (Right) REMOVAL OF NORPLANT (Left)  SURGEON:  Surgeon(s) and Role:    * Dorell Gatlin V, MD - Primary  PHYSICIAN ASSISTANT:   ASSISTANTS: none   ANESTHESIA:   local  EBL:  5 mL   BLOOD ADMINISTERED:none  DRAINS: none   LOCAL MEDICATIONS USED:  MARCAINE    and Amount: 10 ml  SPECIMEN:  Source of Specimen:  Bilateral fallopian tubes  DISPOSITION OF SPECIMEN:  PATHOLOGY  COUNTS:  YES  TOURNIQUET:  * No tourniquets in log *  DICTATION: .Dragon Dictation  PLAN OF CARE: Discharge to home after PACU  PATIENT DISPOSITION:  PACU - hemodynamically stable.   Delay start of Pharmacological VTE agent (>24hrs) due to surgical blood loss or risk of bleeding: not applicable Details of procedure: Patient was taken the operating room prepped and draped for combined abdominal and vaginal procedure.  Timeout was conducted and surgical procedure confirmed by operative team.  As a sterile procedure no antibiotics were required. Speculum was inserted and single-tooth tenaculum used to grasp the cervix Hulka uterine manipulator positioned in the uterus, then attention directed to the abdomen after changing gloves.  Prior to removal of gloves, the small 5 mm skin tag on the right buttock area was elevated and shaved off.  It was completely benign in character and discarded.  Good hemostasis existed. An infraumbilical vertical 1 cm incision was made subumbilically, with Veress needle used to achieve pneumoperitoneum under 5 mm initial opening pressure.  Water droplet  technique was used to confirm free flow into the abdominal cavity.  With 3 L CO2 in the abdomen the laparoscopic 11 mm trocar was used under direct visualization through the umbilicus, and inspection revealed no evidence of trauma associated with peritoneal entry.  The suprapubic and right lower quadrant trochars 5 mm diameter were placed without difficulty under direct visualization.  Attention was directed to the pelvis.  Photos documenting the normal-appearing tubes and ovaries and uterus were done, then the salpingectomy was performed.  The right fallopian tube was elevated, and harmonic a 7 device used to perform salpingectomy with good hemostasis.  Specimen was extracted through the suprapubic site.  The left fallopian tube was extracted in similar fashion after its removal.  Photo to document hemostasis was performed.  120 cc of saline was instilled into the abdomen to assist with deflation of the abdomen and removal of carbon dioxide.  The trochars were removed under low pressure and confirmed as hemostatic.  The fascial closure at the umbilicus was closed with 0 Prolene suture x1 stitch.  The subcuticular 4-0 Vicryl closure was used at the 3 skin openings followed by Steri-Strips and Band-Aids.  Nexplanon removal: The left upper arm was prepped, and a linear 5 mm incision made over the distal end of the Nexplanon which could then be made to protrude through the skin opening.  The fibrous capsule over the Norplant was opened at the tip of the implant and it slid out easily, intact.  Marcaine was injected in the adjacent area, and the Steri-Strips used to pull the skin edges into approximation followed by Band-Aid placement.  Sponge and needle counts were   correct throughout and confirmed per protocol.  Patient to recovery room in stable condition.  Patient's mother notified of the patient's wellbeing

## 2019-02-28 NOTE — Discharge Instructions (Signed)
Laparoscopic Tubal Ligation, Care After °This sheet gives you information about how to care for yourself after your procedure. Your health care provider may also give you more specific instructions. If you have problems or questions, contact your health care provider. °What can I expect after the procedure? °After the procedure, it is common to have: °· A sore throat. °· Discomfort in your shoulder. °· Mild discomfort or cramping in your abdomen. °· Gas pains. °· Pain or soreness in the area where the surgical incision was made. °· A bloated feeling. °· Tiredness. °· Nausea. °· Vomiting. °Follow these instructions at home: °Medicines °· Take over-the-counter and prescription medicines only as told by your health care provider. °· Do not take aspirin because it can cause bleeding. °· Ask your health care provider if the medicine prescribed to you: °? Requires you to avoid driving or using heavy machinery. °? Can cause constipation. You may need to take actions to prevent or treat constipation, such as: °§ Drink enough fluid to keep your urine pale yellow. °§ Take over-the-counter or prescription medicines. °§ Eat foods that are high in fiber, such as beans, whole grains, and fresh fruits and vegetables. °§ Limit foods that are high in fat and processed sugars, such as fried or sweet foods. °Incision care ° °  ° °· Follow instructions from your health care provider about how to take care of your incision. Make sure you: °? Wash your hands with soap and water before and after you change your bandage (dressing). If soap and water are not available, use hand sanitizer. °? Change your dressing as told by your health care provider. °? Leave stitches (sutures), skin glue, or adhesive strips in place. These skin closures may need to stay in place for 2 weeks or longer. If adhesive strip edges start to loosen and curl up, you may trim the loose edges. Do not remove adhesive strips completely unless your health care provider  tells you to do that. °· Check your incision area every day for signs of infection. Check for: °? Redness, swelling, or pain. °? Fluid or blood. °? Warmth. °? Pus or a bad smell. °Activity °· Rest as told by your health care provider. °· Avoid sitting for a long time without moving. Get up to take short walks every 1-2 hours. This is important to improve blood flow and breathing. Ask for help if you feel weak or unsteady. °· Return to your normal activities as told by your health care provider. Ask your health care provider what activities are safe for you. °General instructions °· Do not take baths, swim, or use a hot tub until your health care provider approves. Ask your health care provider if you may take showers. You may only be allowed to take sponge baths. °· Have someone help you with your daily household tasks for the first few days. °· Keep all follow-up visits as told by your health care provider. This is important. °Contact a health care provider if: °· You have redness, swelling, or pain around your incision. °· Your incision feels warm to the touch. °· You have pus or a bad smell coming from your incision. °· The edges of your incision break open after the sutures have been removed. °· Your pain does not improve after 2-3 days. °· You have a rash. °· You repeatedly become dizzy or light-headed. °· Your pain medicine is not helping. °Get help right away if you: °· Have a fever. °· Faint. °· Have increasing   pain in your abdomen. °· Have severe pain in one or both of your shoulders. °· Have fluid or blood coming from your sutures or from your vagina. °· Have shortness of breath or difficulty breathing. °· Have chest pain or leg pain. °· Have ongoing nausea, vomiting, or diarrhea. °Summary °· After the procedure, it is common to have mild discomfort or cramping in your abdomen. °· Take over-the-counter and prescription medicines only as told by your health care provider. °· Watch for symptoms that should  prompt you to call your health care provider. °· Keep all follow-up visits as told by your health care provider. This is important. °This information is not intended to replace advice given to you by your health care provider. Make sure you discuss any questions you have with your health care provider. °Document Released: 11/21/2004 Document Revised: 03/29/2018 Document Reviewed: 03/29/2018 °Elsevier Patient Education © 2020 [employer] ° °

## 2019-02-28 NOTE — Anesthesia Procedure Notes (Signed)
Procedure Name: Intubation Date/Time: 02/28/2019 7:35 AM Performed by: Lyda Jester, CRNA Pre-anesthesia Checklist: Patient identified, Patient being monitored, Timeout performed, Emergency Drugs available and Suction available Patient Re-evaluated:Patient Re-evaluated prior to induction Oxygen Delivery Method: Circle System Utilized Preoxygenation: Pre-oxygenation with 100% oxygen Induction Type: IV induction Ventilation: Mask ventilation without difficulty Laryngoscope Size: Miller and 2 Grade View: Grade II Tube type: Oral Tube size: 7.0 mm Number of attempts: 1 Airway Equipment and Method: stylet Placement Confirmation: ETT inserted through vocal cords under direct vision,  positive ETCO2 and breath sounds checked- equal and bilateral Secured at: 21 cm Tube secured with: Tape Dental Injury: Teeth and Oropharynx as per pre-operative assessment

## 2019-02-28 NOTE — H&P (Signed)
Expand All Collapse All   Patient ID: Patricia Clackisha D Schexnayder, female   DOB: 18-Jul-1985, 33 y.o.   MRN: [id number]  Preoperative History and Physical  Patricia Williamson is a 33 y.o. [id number] here for surgical management of contraception management.  No significant preoperative concerns. Pt has fhx of cervical and breast cancer.  Proposed surgery: laparoscopic  bilateral salingectomy, removal of Norplant left upper arm, removal right thigh skin tag.      Past Medical History:  Diagnosis Date  . Asthma   . Carpal tunnel syndrome   . Gall stones   . Hx of constipation   . Nexplanon insertion 10/11/2013   Inserted left arm 10/11/13 remove 5/27 /18        Past Surgical History:  Procedure Laterality Date  . CHOLECYSTECTOMY N/A 04/06/2014   Procedure: LAPAROSCOPIC CHOLECYSTECTOMY;  Surgeon: Dalia HeadingMark A Jenkins, MD;  Location: [facility];  Service: General;  Laterality: N/A;  . CYSTOSCOPY W/ URETERAL STENT PLACEMENT                     OB History  Gravida Para Term Preterm AB Living  2 2 1 1   2   SAB TAB Ectopic Multiple Live Births             2       # Outcome Date GA Lbr Len/2nd Weight Sex Delivery Anes PTL Lv  2 Preterm 05/22/13 [id number] 01:35 / 02:18 4 lb 11.8 oz (2.15 kg) F Vag-Spont EPI  LIV     Birth Comments: Mom 33 y/o G2P2, GBS + partially treated, Rubella non immune, Good prenatal care. Mom hospitaluzed during pregnancy with cholelithiasis Baby 34 w, NSVD, NICU x 17 days for prematurity and r/o sepsis. Hypotonic. No Resp issues. No phototherapy. Has anemia, on iron. Had murmer of PPS with no need to f/u unless worse. BW 2150, DW 2345.  1 Term 01/02/10 [id number]  6 lb 5 oz (2.863 kg) F Vag-Spont EPI  LIV  Patient denies any other pertinent gynecologic issues.         Current Outpatient Medications on File Prior to Visit  Medication Sig Dispense Refill  . diclofenac sodium (VOLTAREN) 1 % GEL Apply 4 g topically 4 (four) times daily. 50 g 0  . etonogestrel (NEXPLANON) 68 MG IMPL  implant Inject 1 each into the skin once.    Marland Kitchen. ibuprofen (ADVIL) 800 MG tablet Take 1 tablet (800 mg total) by mouth every 8 (eight) hours as needed. 21 tablet 0   No current facility-administered medications on file prior to visit.        Allergies  Allergen Reactions  . Vinegar [Acetic Acid] Hives and Itching  . Sulfa Antibiotics Rash    Social History:   reports that she has been smoking cigarettes. She has a 4.00 pack-year smoking history. She has never used smokeless tobacco. She reports that she does not drink alcohol or use drugs.       Family History  Problem Relation Age of Onset  . Hypertension Mother   . Diabetes Mother   . Hyperlipidemia Mother   . Hypertension Brother   . Diabetes Maternal Grandmother   . Heart disease Maternal Grandmother   . Other Paternal Aunt        benign breast lump    Review of Systems: Noncontributory  PHYSICAL EXAM: There were no vitals taken for this visit. General appearance - alert, well appearing, and in no distress Chest - clear to auscultation,  no wheezes, rales or rhonchi, symmetric air entry Heart - normal rate and regular rhythm Abdomen - soft, nontender, nondistended, no masses or organomegaly Extremities: intact, normal pulses. The Norplant is palpable slightly deeper than usual in the distal Left Upper Arm, and has been palpated,  marked this morning by me and confirmed by the patient.  Pelvic:  VAGINA: good vaginal length CERVIX: normal appearing cervix UTERUS: uterus is normal size, shape, consistency and nontender. GCCHL collected and returned negative.  Extremities - peripheral pulses normal, no pedal edema, no clubbing or cyanosis                    1 cm skin tag on back of fight upper leg. Labs: CBC    Component Value Date/Time   WBC 8.1 02/24/2019 0802   RBC 5.52 (H) 02/24/2019 0802   HGB 16.6 (H) 02/24/2019 0802   HGB 17.0 (H) 07/11/2018 1004   HCT 49.9 (H) 02/24/2019 0802   HCT 49.8  (H) 07/11/2018 1004   PLT 213 02/24/2019 0802   PLT 290 07/11/2018 1004   MCV 90.4 02/24/2019 0802   MCV 89 07/11/2018 1004   MCH 30.1 02/24/2019 0802   MCHC 33.3 02/24/2019 0802   RDW 12.9 02/24/2019 0802   RDW 12.4 07/11/2018 1004   LYMPHSABS 2.4 12/29/2018 1144   MONOABS 0.6 12/29/2018 1144   EOSABS 0.1 12/29/2018 1144   BASOSABS 0.1 12/29/2018 1144   CMP     Component Value Date/Time   NA 138 02/24/2019 0802   NA 142 07/11/2018 1004   K 3.8 02/24/2019 0802   CL 111 02/24/2019 0802   CO2 21 (L) 02/24/2019 0802   GLUCOSE 99 02/24/2019 0802   BUN 7 02/24/2019 0802   BUN 8 07/11/2018 1004   CREATININE 0.58 02/24/2019 0802   CALCIUM 8.8 (L) 02/24/2019 0802   PROT 7.5 02/24/2019 0802   PROT 7.1 07/11/2018 1004   ALBUMIN 4.2 02/24/2019 0802   ALBUMIN 4.5 07/11/2018 1004   AST 18 02/24/2019 0802   ALT 16 02/24/2019 0802   ALKPHOS 52 02/24/2019 0802   BILITOT 0.3 02/24/2019 0802   BILITOT 0.2 07/11/2018 1004   GFRNONAA >60 02/24/2019 0802   GFRAA >60 02/24/2019 0802     Imaging Studies:  Imaging Results  Dg Ankle Complete Right  Result Date: 01/15/2019 CLINICAL DATA:  Right foot and ankle pain after injury. Heard a pop. EXAM: RIGHT ANKLE - COMPLETE 3+ VIEW COMPARISON:  None. FINDINGS: There is no evidence of fracture, dislocation, or joint effusion. Plantar calcaneal spur and Achilles tendon enthesophyte. There is no evidence of arthropathy or other focal bone abnormality. Soft tissues are unremarkable. IMPRESSION: 1. No fracture or subluxation of the right ankle. 2. Plantar calcaneal spur and Achilles tendon enthesophyte. Electronically Signed   By: Narda Rutherford M.D.   On: 01/15/2019 20:27   Ct Abdomen Pelvis W Contrast  Result Date: 12/29/2018 CLINICAL DATA:  Abdominal pain, primarily right lower quadrant. Nausea. EXAM: CT ABDOMEN AND PELVIS WITH CONTRAST TECHNIQUE: Multidetector CT imaging of the abdomen and pelvis was performed using the standard protocol  following bolus administration of intravenous contrast. CONTRAST:  OMNIPAQUE IOHEXOL 300 MG/ML  SOLN COMPARISON:  None. FINDINGS: Lower chest: There is mild atelectatic change in the lung bases. There is no edema or consolidation in the lung bases. Hepatobiliary: There is fatty infiltration near the fissure for the ligamentum teres. No focal liver lesions are apparent. The gallbladder is absent. There is no biliary  duct dilatation. Pancreas: There is no pancreatic mass or inflammatory focus. Spleen: No splenic lesions are evident. Adrenals/Urinary Tract: Adrenals bilaterally appear normal. Kidneys bilaterally show no evident mass or hydronephrosis on either side. Stomach/Bowel: There is no appreciable bowel wall or mesenteric thickening. No appreciable diverticular disease evident. There is no appreciable bowel obstruction. Terminal ileum appears unremarkable. There is no free air or portal venous air. Vascular/Lymphatic: There is no abdominal aortic aneurysm. No vascular lesions are evident. There is no evident adenopathy in the abdomen or pelvis by size criteria. There are several subcentimeter lymph nodes in the right mid to lower abdomen as well as in the left retroperitoneal region. Reproductive: Uterus is anteverted. There is a presumed dominant follicle arising from the right ovary measuring 2.4 x 2.1 cm. Beyond this apparent follicle, no pelvic masses are evident. Other: Appendix appears unremarkable. No evident abscess or ascites in the abdomen or pelvis. There is a small ventral hernia containing fat but no bowel. Musculoskeletal: No blastic or lytic bone lesions. There are no intramuscular lesions. IMPRESSION: 1. No evident bowel obstruction. Appendix appears normal. No diverticulitis. No abscess in the abdomen or pelvis. 2. Subcentimeter lymph nodes in the right mid to lower abdomen. In the appropriate clinical setting, these lymph nodes may be indicative of a degree of mesenteric adenitis. No  frank adenopathy by size criteria in the abdomen or pelvis evident on this study. 3. No evident renal or ureteral calculus. No hydronephrosis. Urinary bladder wall thickness within normal limits. 4.  Gallbladder absent. Electronically Signed   By: Lowella Grip III M.D.   On: 12/29/2018 15:03   Dg Foot Complete Right  Result Date: 01/15/2019 CLINICAL DATA:  Right foot and ankle pain after injury. Heard a pop. EXAM: RIGHT FOOT COMPLETE - 3+ VIEW COMPARISON:  None. FINDINGS: There is no evidence of fracture or dislocation. Plantar calcaneal spur and Achilles tendon enthesophyte. There is no evidence of arthropathy or other focal bone abnormality. Soft tissues are unremarkable. IMPRESSION: No fracture or subluxation of the right foot. Electronically Signed   By: Keith Rake M.D.   On: 01/15/2019 20:28     Assessment:     Patient Active Problem List   Diagnosis Date Noted  . Breakthrough bleeding on Nexplanon 05/06/2016  . Bloody stools 07/25/2015  . Nexplanon insertion 10/11/2013  . Abnormal uterine bleeding (AUB) 08/03/2013  . Asthma exacerbation 02/22/2013  . Bronchitis with asthma, acute 02/22/2013    Plan: Patient will undergo: 1:surgical management with laparoscopic bilateral salpingectomy, removal of Norplant left upper arm 2: with removal of right thigh skin tag 02/28/2019.

## 2019-02-28 NOTE — Transfer of Care (Signed)
Immediate Anesthesia Transfer of Care Note  Patient: Patricia Williamson  Procedure(s) Performed: LAPAROSCOPIC BILATERAL SALPINGECTOMY (Bilateral Abdomen) EXCISION OF GLUTEAL SKIN TAG (Right Groin) REMOVAL OF NORPLANT (Left Arm Upper)  Patient Location: PACU  Anesthesia Type:General  Level of Consciousness: awake, alert  and oriented  Airway & Oxygen Therapy: Patient Spontanous Breathing and Patient connected to nasal cannula oxygen  Post-op Assessment: Report given to RN, Post -op Vital signs reviewed and stable and Patient moving all extremities X 4  Post vital signs: Reviewed and stable  Last Vitals:  Vitals Value Taken Time  BP 126/74 02/28/19 0840  Temp    Pulse 89 02/28/19 0843  Resp 18 02/28/19 0843  SpO2 97 % 02/28/19 0843  Vitals shown include unvalidated device data.  Last Pain:  Vitals:   02/28/19 0643  TempSrc: Oral  PainSc: 0-No pain      Patients Stated Pain Goal: 8 (94/80/16 5537)  Complications: No apparent anesthesia complications

## 2019-02-28 NOTE — Anesthesia Preprocedure Evaluation (Signed)
Anesthesia Evaluation  Patient identified by MRN, date of birth, ID band Patient awake    Reviewed: Allergy & Precautions, NPO status , Patient's Chart, lab work & pertinent test results  Airway Mallampati: II  TM Distance: >3 FB Neck ROM: Full    Dental no notable dental hx. (+) Teeth Intact   Pulmonary asthma , Current SmokerPatient did not abstain from smoking.,    Pulmonary exam normal breath sounds clear to auscultation       Cardiovascular Exercise Tolerance: Good negative cardio ROS Normal cardiovascular examI Rhythm:Regular Rate:Normal     Neuro/Psych  Neuromuscular disease negative psych ROS   GI/Hepatic negative GI ROS, Neg liver ROS,   Endo/Other  negative endocrine ROS  Renal/GU negative Renal ROS  negative genitourinary   Musculoskeletal negative musculoskeletal ROS (+)   Abdominal   Peds negative pediatric ROS (+)  Hematology negative hematology ROS (+)   Anesthesia Other Findings   Reproductive/Obstetrics negative OB ROS                             Anesthesia Physical Anesthesia Plan  ASA: II  Anesthesia Plan: General   Post-op Pain Management:    Induction: Intravenous  PONV Risk Score and Plan: 2 and Ondansetron, Midazolam, Dexamethasone and Treatment may vary due to age or medical condition  Airway Management Planned: Oral ETT  Additional Equipment:   Intra-op Plan:   Post-operative Plan: Extubation in OR  Informed Consent: I have reviewed the patients History and Physical, chart, labs and discussed the procedure including the risks, benefits and alternatives for the proposed anesthesia with the patient or authorized representative who has indicated his/her understanding and acceptance.     Dental advisory given  Plan Discussed with: CRNA  Anesthesia Plan Comments: (Plan Full PPE use Plan GETA D/W PT -WTP with same after Q&A)        Anesthesia  Quick Evaluation

## 2019-02-28 NOTE — Anesthesia Postprocedure Evaluation (Signed)
Anesthesia Post Note Late Entry for 1010 Patient: Patricia Williamson  Procedure(s) Performed: LAPAROSCOPIC BILATERAL SALPINGECTOMY (Bilateral Abdomen) EXCISION OF GLUTEAL SKIN TAG (Right Groin) REMOVAL OF NORPLANT (Left Arm Upper)  Patient location during evaluation: Phase II Anesthesia Type: General Level of consciousness: awake and alert, oriented and patient cooperative Pain management: pain level controlled Vital Signs Assessment: post-procedure vital signs reviewed and stable Respiratory status: spontaneous breathing Cardiovascular status: stable Postop Assessment: no apparent nausea or vomiting Anesthetic complications: no     Last Vitals:  Vitals:   02/28/19 0915 02/28/19 0936  BP: 126/85 122/81  Pulse: 75 77  Resp: 19 19  Temp:  36.5 C  SpO2: 95% 97%    Last Pain:  Vitals:   02/28/19 0936  TempSrc: Oral  PainSc: 0-No pain                 ADAMS, AMY A

## 2019-02-28 NOTE — Brief Op Note (Signed)
02/28/2019  8:55 AM  PATIENT:  Patricia Williamson  33 y.o. female  PRE-OPERATIVE DIAGNOSIS: Elective permanent sterilization, Nexplanon removal, removal of skin tag right gluteal area  POST-OPERATIVE DIAGNOSIS:  request for permanent sterilization, patient request removal of skin tag right gluteal area, Nexplanon removal  PROCEDURE:  Procedure(s): LAPAROSCOPIC BILATERAL SALPINGECTOMY (Bilateral) EXCISION OF GLUTEAL SKIN TAG (Right) REMOVAL OF NORPLANT (Left)  SURGEON:  Surgeon(s) and Role:    Tilda Burrow, MD - Primary  PHYSICIAN ASSISTANT:   ASSISTANTS: none   ANESTHESIA:   local  EBL:  5 mL   BLOOD ADMINISTERED:none  DRAINS: none   LOCAL MEDICATIONS USED:  MARCAINE    and Amount: 10 ml  SPECIMEN:  Source of Specimen:  Bilateral fallopian tubes  DISPOSITION OF SPECIMEN:  PATHOLOGY  COUNTS:  YES  TOURNIQUET:  * No tourniquets in log *  DICTATION: .Dragon Dictation  PLAN OF CARE: Discharge to home after PACU  PATIENT DISPOSITION:  PACU - hemodynamically stable.   Delay start of Pharmacological VTE agent (>24hrs) due to surgical blood loss or risk of bleeding: not applicable Details of procedure: Patient was taken the operating room prepped and draped for combined abdominal and vaginal procedure.  Timeout was conducted and surgical procedure confirmed by operative team.  As a sterile procedure no antibiotics were required. Speculum was inserted and single-tooth tenaculum used to grasp the cervix Hulka uterine manipulator positioned in the uterus, then attention directed to the abdomen after changing gloves.  Prior to removal of gloves, the small 5 mm skin tag on the right buttock area was elevated and shaved off.  It was completely benign in character and discarded.  Good hemostasis existed. An infraumbilical vertical 1 cm incision was made subumbilically, with Veress needle used to achieve pneumoperitoneum under 5 mm initial opening pressure.  Water droplet  technique was used to confirm free flow into the abdominal cavity.  With 3 L CO2 in the abdomen the laparoscopic 11 mm trocar was used under direct visualization through the umbilicus, and inspection revealed no evidence of trauma associated with peritoneal entry.  The suprapubic and right lower quadrant trochars 5 mm diameter were placed without difficulty under direct visualization.  Attention was directed to the pelvis.  Photos documenting the normal-appearing tubes and ovaries and uterus were done, then the salpingectomy was performed.  The right fallopian tube was elevated, and harmonic a 7 device used to perform salpingectomy with good hemostasis.  Specimen was extracted through the suprapubic site.  The left fallopian tube was extracted in similar fashion after its removal.  Photo to document hemostasis was performed.  120 cc of saline was instilled into the abdomen to assist with deflation of the abdomen and removal of carbon dioxide.  The trochars were removed under low pressure and confirmed as hemostatic.  The fascial closure at the umbilicus was closed with 0 Prolene suture x1 stitch.  The subcuticular 4-0 Vicryl closure was used at the 3 skin openings followed by Steri-Strips and Band-Aids.  Nexplanon removal: The left upper arm was prepped, and a linear 5 mm incision made over the distal end of the Nexplanon which could then be made to protrude through the skin opening.  The fibrous capsule over the Norplant was opened at the tip of the implant and it slid out easily, intact.  Marcaine was injected in the adjacent area, and the Steri-Strips used to pull the skin edges into approximation followed by Band-Aid placement.  Sponge and needle counts were  correct throughout and confirmed per protocol.  Patient to recovery room in stable condition.  Patient's mother notified of the patient's wellbeing

## 2019-03-01 ENCOUNTER — Encounter (HOSPITAL_COMMUNITY): Payer: Self-pay | Admitting: Obstetrics and Gynecology

## 2019-03-01 LAB — SURGICAL PATHOLOGY

## 2019-03-08 ENCOUNTER — Other Ambulatory Visit: Payer: Self-pay

## 2019-03-08 ENCOUNTER — Ambulatory Visit (INDEPENDENT_AMBULATORY_CARE_PROVIDER_SITE_OTHER): Payer: Self-pay | Admitting: Obstetrics and Gynecology

## 2019-03-08 ENCOUNTER — Encounter: Payer: Self-pay | Admitting: Obstetrics and Gynecology

## 2019-03-08 VITALS — BP 119/74 | HR 71 | Ht 61.0 in | Wt 189.9 lb

## 2019-03-08 DIAGNOSIS — Z09 Encounter for follow-up examination after completed treatment for conditions other than malignant neoplasm: Secondary | ICD-10-CM

## 2019-03-08 NOTE — Progress Notes (Signed)
   Subjective:  Patricia Williamson is a 33 y.o. female now 2 weeks status post tubal ligation.    she wants to return to work.  No complaints.  Review of Systems Negative except    Diet:   reg   Bowel movements : normal.  The patient is not having any pain. Only soreness, slight Objective:  BP 119/74 (BP Location: Right Arm, Patient Position: Sitting, Cuff Size: Normal)   Pulse 71   Ht 5\' 1"  (1.549 m)   Wt 189 lb 14.4 oz (86.1 kg)   LMP 02/23/2019   BMI 35.88 kg/m  General:Well developed, well nourished.  No acute distress. Abdomen: Bowel sounds normal, soft, non-tender. Pelvic Exam:    EIncision(s): N/A  Healing well . Slight bruising at mid line below umbilicus.     Assessment:  Post-Op 2 weeks s/p Laparscopic bilateral salpingectomy   stable postoperatively.   Plan:  1.Wound care discussed  Aloe, bandaids,  2. . current medications.none 3. Activity restrictions: none 4. return to work: now. 5. Follow up in prn .

## 2019-04-08 ENCOUNTER — Encounter (HOSPITAL_COMMUNITY): Payer: Self-pay | Admitting: Emergency Medicine

## 2019-04-08 ENCOUNTER — Other Ambulatory Visit: Payer: Self-pay

## 2019-04-08 ENCOUNTER — Emergency Department (HOSPITAL_COMMUNITY)
Admission: EM | Admit: 2019-04-08 | Discharge: 2019-04-08 | Disposition: A | Discharge status: Home / Self Care | Payer: Medicaid Other | Attending: Emergency Medicine | Admitting: Emergency Medicine

## 2019-04-08 DIAGNOSIS — N938 Other specified abnormal uterine and vaginal bleeding: Secondary | ICD-10-CM | POA: Insufficient documentation

## 2019-04-08 DIAGNOSIS — F17210 Nicotine dependence, cigarettes, uncomplicated: Secondary | ICD-10-CM | POA: Insufficient documentation

## 2019-04-08 DIAGNOSIS — J45909 Unspecified asthma, uncomplicated: Secondary | ICD-10-CM | POA: Insufficient documentation

## 2019-04-08 DIAGNOSIS — Z79899 Other long term (current) drug therapy: Secondary | ICD-10-CM | POA: Insufficient documentation

## 2019-04-08 LAB — COMPREHENSIVE METABOLIC PANEL
ALT: 14 U/L (ref 0–44)
AST: 15 U/L (ref 15–41)
Albumin: 4.1 g/dL (ref 3.5–5.0)
Alkaline Phosphatase: 53 U/L (ref 38–126)
Anion gap: 8 (ref 5–15)
BUN: 11 mg/dL (ref 6–20)
CO2: 22 mmol/L (ref 22–32)
Calcium: 9.2 mg/dL (ref 8.9–10.3)
Chloride: 110 mmol/L (ref 98–111)
Creatinine, Ser: 0.53 mg/dL (ref 0.44–1.00)
GFR calc Af Amer: >60 mL/min (ref >60)
GFR calc non Af Amer: >60 mL/min (ref >60)
Glucose, Bld: 101 mg/dL — ABNORMAL HIGH (ref 70–99)
Potassium: 3.9 mmol/L (ref 3.5–5.1)
Sodium: 140 mmol/L (ref 135–145)
Total Bilirubin: 0.4 mg/dL (ref 0.3–1.2)
Total Protein: 7.2 g/dL (ref 6.5–8.1)

## 2019-04-08 LAB — URINALYSIS, ROUTINE W REFLEX MICROSCOPIC
Bacteria, UA: NONE SEEN
Bilirubin Urine: NEGATIVE
Glucose, UA: NEGATIVE mg/dL
Ketones, ur: NEGATIVE mg/dL
Leukocytes,Ua: NEGATIVE
Nitrite: NEGATIVE
Protein, ur: NEGATIVE mg/dL
Specific Gravity, Urine: 1.009 (ref 1.005–1.030)
pH: 5 (ref 5.0–8.0)

## 2019-04-08 LAB — WET PREP, GENITAL
Clue Cells Wet Prep HPF POC: NONE SEEN
Sperm: NONE SEEN
Trich, Wet Prep: NONE SEEN
Yeast Wet Prep HPF POC: NONE SEEN

## 2019-04-08 LAB — CBC WITH DIFFERENTIAL/PLATELET
Abs Immature Granulocytes: 0.03 10*3/uL (ref 0.00–0.07)
Basophils Absolute: 0.1 10*3/uL (ref 0.0–0.1)
Basophils Relative: 1 %
Eosinophils Absolute: 0.1 10*3/uL (ref 0.0–0.5)
Eosinophils Relative: 1 %
HCT: 48 % — ABNORMAL HIGH (ref 36.0–46.0)
Hemoglobin: 16.1 g/dL — ABNORMAL HIGH (ref 12.0–15.0)
Immature Granulocytes: 0 %
Lymphocytes Relative: 26 %
Lymphs Abs: 2.1 10*3/uL (ref 0.7–4.0)
MCH: 30.3 pg (ref 26.0–34.0)
MCHC: 33.5 g/dL (ref 30.0–36.0)
MCV: 90.2 fL (ref 80.0–100.0)
Monocytes Absolute: 0.6 10*3/uL (ref 0.1–1.0)
Monocytes Relative: 7 %
Neutro Abs: 5.1 10*3/uL (ref 1.7–7.7)
Neutrophils Relative %: 65 %
Platelets: 216 10*3/uL (ref 150–400)
RBC: 5.32 MIL/uL — ABNORMAL HIGH (ref 3.87–5.11)
RDW: 12.8 % (ref 11.5–15.5)
WBC: 8 10*3/uL (ref 4.0–10.5)
nRBC: 0 % (ref 0.0–0.2)

## 2019-04-08 LAB — POC URINE PREG, ED: Preg Test, Ur: NEGATIVE

## 2019-04-08 MED ORDER — KETOROLAC TROMETHAMINE 30 MG/ML IJ SOLN
30.0000 mg | Freq: Once | INTRAMUSCULAR | Status: AC
  Administered 2019-04-08: 30 mg via INTRAMUSCULAR
  Filled 2019-04-08: qty 1

## 2019-04-08 MED ORDER — IBUPROFEN 800 MG PO TABS: 800.0000 mg | ORAL_TABLET | Freq: Three times a day (TID) | ORAL | 0 refills | Status: DC | PRN

## 2019-04-08 MED ORDER — TRAMADOL HCL 50 MG PO TABS: 50.0000 mg | ORAL_TABLET | Freq: Four times a day (QID) | ORAL | 0 refills | Status: DC | PRN

## 2019-04-08 NOTE — ED Notes (Signed)
Lab at bedside

## 2019-04-08 NOTE — ED Provider Notes (Signed)
[facility] EMERGENCY DEPARTMENT Provider Note   CSN: [id number] Arrival date & time: 04/08/19  0830     History   Chief Complaint Chief Complaint  Patient presents with  . Vaginal Bleeding    HPI Patricia Williamson is a 33 y.o. female with a history of significant for bilateral salpingectomy on 02/28/19 , Dr. Glo Herring at which time her Nexplanon was removed, presenting with abnormal vaginal bleeding. She reports heavy vaginal bleeding along with cramping starting this am. She reports never having real periods, instead would have very abrupt vaginal bleeding in the past, lasting several hours than resolving.  She was told that after she had tubes removed, she would no longer have periods. She has had no fevers, denies n/v, abdominal pain, dysuria but has noticed increased urinary frequency. She does report having constipation since her surgery. Her last bm was yesterday evening.  She has had no treatment prior to arrival.      The history is provided by the patient.    Past Medical History:  Diagnosis Date  . Asthma   . Carpal tunnel syndrome   . Gall stones   . Hx of constipation   . Nexplanon insertion 10/11/2013   Inserted left arm 10/11/13 remove 5/27 /18    Patient Active Problem List   Diagnosis Date Noted  . Encounter for Nexplanon removal 02/28/2019  . Breakthrough bleeding on Nexplanon 05/06/2016  . Bloody stools 07/25/2015  . Encounter for female sterilization procedure 10/11/2013  . Abnormal uterine bleeding (AUB) 08/03/2013  . Asthma exacerbation 02/22/2013  . Bronchitis with asthma, acute 02/22/2013    Past Surgical History:  Procedure Laterality Date  . CHOLECYSTECTOMY N/A 04/06/2014   Procedure: LAPAROSCOPIC CHOLECYSTECTOMY;  Surgeon: Jamesetta So, MD;  Location: [facility];  Service: General;  Laterality: N/A;  . CYSTOSCOPY W/ URETERAL STENT PLACEMENT    . EXCISION OF SKIN TAG Right 02/28/2019   Procedure: EXCISION OF GLUTEAL SKIN TAG;  Surgeon: Jonnie Kind, MD;  Location: [facility];  Service: Gynecology;  Laterality: Right;  . LAPAROSCOPIC BILATERAL SALPINGECTOMY Bilateral 02/28/2019   Procedure: LAPAROSCOPIC BILATERAL SALPINGECTOMY;  Surgeon: Jonnie Kind, MD;  Location: [facility];  Service: Gynecology;  Laterality: Bilateral;  . NORPLANT REMOVAL Left 02/28/2019   Procedure: REMOVAL OF NORPLANT;  Surgeon: Jonnie Kind, MD;  Location: [facility];  Service: Gynecology;  Laterality: Left;  . TONSILECTOMY, ADENOIDECTOMY, BILATERAL MYRINGOTOMY AND TUBES     age 69     OB History    Gravida  2   Para  2   Term  1   Preterm  1   AB      Living  2     SAB      TAB      Ectopic      Multiple      Live Births  2            Home Medications    Prior to Admission medications   Medication Sig Start Date End Date Taking? Authorizing Provider  albuterol (VENTOLIN HFA) 108 (90 Base) MCG/ACT inhaler Inhale 1 puff into the lungs every 6 (six) hours as needed for wheezing or shortness of breath.   Yes [provider]  HYDROcodone-acetaminophen (NORCO/VICODIN) 5-325 MG tablet Take 1 tablet by mouth every 6 (six) hours as needed for moderate pain. May take with ibuprofen Patient not taking: Reported on 04/08/2019 02/28/19   Jonnie Kind, MD  ibuprofen (ADVIL) 800 MG tablet  Take 1 tablet (800 mg total) by mouth every 8 (eight) hours as needed for cramping. 04/08/19   Burgess Amor, PA-C  traMADol (ULTRAM) 50 MG tablet Take 1 tablet (50 mg total) by mouth every 6 (six) hours as needed for moderate pain. 04/08/19   Burgess Amor, PA-C    Family History Family History  Problem Relation Age of Onset  . Hypertension Mother   . Diabetes Mother   . Hyperlipidemia Mother   . Hypertension Brother   . Diabetes Maternal Grandmother   . Heart disease Maternal Grandmother   . Other Paternal Aunt        benign breast lump    Social History Social History   Tobacco Use  . Smoking status: Current Every Day Smoker     Packs/day: 1.00    Years: 4.00    Pack years: 4.00    Types: Cigarettes  . Smokeless tobacco: Never Used  Substance Use Topics  . Alcohol use: No    Frequency: Never  . Drug use: No     Allergies   Vinegar [acetic acid] and Sulfa antibiotics   Review of Systems Review of Systems  Constitutional: Negative for fever.  HENT: Negative for congestion and sore throat.   Eyes: Negative.   Respiratory: Negative for chest tightness and shortness of breath.   Cardiovascular: Negative for chest pain.  Gastrointestinal: Negative for abdominal pain and nausea.  Genitourinary: Positive for frequency, menstrual problem, pelvic pain and vaginal bleeding. Negative for dysuria and vaginal discharge.  Musculoskeletal: Negative for arthralgias, joint swelling and neck pain.  Skin: Negative.  Negative for rash and wound.  Neurological: Negative for dizziness, weakness, light-headedness, numbness and headaches.  Psychiatric/Behavioral: Negative.      Physical Exam Updated Vital Signs BP 126/60   Pulse 63   Temp 98.5 F (36.9 C) (Oral)   Resp 18   Ht 5\' 1"  (1.549 m)   Wt 89.8 kg   SpO2 96%   BMI 37.41 kg/m   Physical Exam Vitals signs and nursing note reviewed. Exam conducted with a chaperone present.  Constitutional:      Appearance: She is well-developed.  HENT:     Head: Normocephalic and atraumatic.  Eyes:     Conjunctiva/sclera: Conjunctivae normal.  Neck:     Musculoskeletal: Normal range of motion.  Cardiovascular:     Rate and Rhythm: Normal rate and regular rhythm.     Heart sounds: Normal heart sounds.  Pulmonary:     Effort: Pulmonary effort is normal.     Breath sounds: Normal breath sounds. No wheezing.  Abdominal:     General: Bowel sounds are normal. There is no distension.     Palpations: Abdomen is soft.     Tenderness: There is no abdominal tenderness. There is no guarding.  Genitourinary:    Cervix: No cervical motion tenderness.     Uterus: Tender.       Adnexa: Right adnexa normal and left adnexa normal.       Right: No mass or tenderness.         Left: No mass or tenderness.       Comments: Mild midline tenderness. Uterus soft, midline.  No adnexal tenderness. No cmt. Blood in vagina c/w menses. Musculoskeletal: Normal range of motion.  Skin:    General: Skin is warm and dry.  Neurological:     Mental Status: She is alert.      ED Treatments / Results  Labs (all labs ordered are  listed, but only abnormal results are displayed) Labs Reviewed  WET PREP, GENITAL - Abnormal; Notable for the following components:      Result Value   WBC, Wet Prep HPF POC FEW (*)    All other components within normal limits  URINALYSIS, ROUTINE W REFLEX MICROSCOPIC - Abnormal; Notable for the following components:   Hgb urine dipstick SMALL (*)    All other components within normal limits  CBC WITH DIFFERENTIAL/PLATELET - Abnormal; Notable for the following components:   RBC 5.32 (*)    Hemoglobin 16.1 (*)    HCT 48.0 (*)    All other components within normal limits  COMPREHENSIVE METABOLIC PANEL - Abnormal; Notable for the following components:   Glucose, Bld 101 (*)    All other components within normal limits  POC URINE PREG, ED  GC/CHLAMYDIA PROBE AMP ([place]) NOT AT [facility]    EKG None  Radiology No results found.  Procedures Procedures (including critical care time)  Medications Ordered in ED Medications  ketorolac (TORADOL) 30 MG/ML injection 30 mg (30 mg Intramuscular Given 04/08/19 1018)     Initial Impression / Assessment and Plan / ED Course  I have reviewed the triage vital signs and the nursing notes.  Pertinent labs & imaging results that were available during my care of the patient were reviewed by me and considered in my medical decision making (see chart for details).        Labs reviewed and discussed with pt.  She has no lab findings of concern, no pregnancy (which was her biggest concern today). No  anemia, UA negative except from rbc's suspected vaginal contamination.  No adnexal tenderness. VSS.  Pt prescribed ibuprofen, tramadol, discussed heat tx which may help relieve menstrual cramping. Prn f/u with gyn as needed.   Final Clinical Impressions(s) / ED Diagnoses   Final diagnoses:  DUB (dysfunctional uterine bleeding)    ED Discharge Orders         Ordered    ibuprofen (ADVIL) 800 MG tablet  Every 8 hours PRN     04/08/19 1106    traMADol (ULTRAM) 50 MG tablet  Every 6 hours PRN     04/08/19 1106           Burgess Amordol, Tavion Senkbeil, PA-C 04/08/19 1213    Linwood DibblesKnapp, Jon, MD 04/08/19 1526

## 2019-04-08 NOTE — Discharge Instructions (Addendum)
Your lab tests today are stable and you are not pregnant.  I recommend high dose ibuprofen for assistance with your menstrual cramping which has been prescribed for you.  I have also prescribed a few tramadol tablets if needed for additional pain relief - this will make you drowsy - do not drive within 4 hours of taking this medicine. This medicine can cause constipation - continue using a stool softener if you decide to take this medicine.  A heating pad applied to your lower abdomen also can be soothing.

## 2019-04-08 NOTE — ED Triage Notes (Signed)
Pt c/o sudden onset of heavy vaginal bleeding and pelvic pressure/pain that began this morning. Pt states she had her Fallopian tubes removed 10/13. Pt called OB/GYN ([facility]) and sent pt here for further evaluation. Pt was told that she was not supposed to have periods/bleeding after tube removal. Pt states she has been through 2 pads this morning.

## 2019-04-10 ENCOUNTER — Ambulatory Visit (INDEPENDENT_AMBULATORY_CARE_PROVIDER_SITE_OTHER): Payer: Self-pay | Admitting: Obstetrics and Gynecology

## 2019-04-10 ENCOUNTER — Encounter: Payer: Self-pay | Admitting: Obstetrics and Gynecology

## 2019-04-10 ENCOUNTER — Other Ambulatory Visit: Payer: Self-pay

## 2019-04-10 ENCOUNTER — Telehealth: Payer: Self-pay | Admitting: Obstetrics and Gynecology

## 2019-04-10 VITALS — BP 109/65 | HR 72 | Ht 61.0 in | Wt 192.6 lb

## 2019-04-10 DIAGNOSIS — N939 Abnormal uterine and vaginal bleeding, unspecified: Secondary | ICD-10-CM

## 2019-04-10 MED ORDER — MEGESTROL ACETATE 40 MG PO TABS: 40.0000 mg | ORAL_TABLET | Freq: Three times a day (TID) | ORAL | 2 refills | Status: DC

## 2019-04-10 NOTE — Progress Notes (Signed)
Patient ID: [name], female   DOB: Jul 13, 1985, 33 y.o.   MRN: [id number]    [facility] Visit  @DATE @            Patient name: Patricia Williamson MRN [id number]  Date of birth: 03-02-1986  CC & HPI:  Patricia Williamson is a 33 y.o. female presenting today for ER f/u. Dx was DUB. She is 1 month s/p tubal ligation. She had test runs and is unsure source of bleeding. The pain is more upsetting than anything. Bleeding started Friday 04/04/2019.  ROS:  ROS +Vaginal bleeding s/p tubal ligation -fever -chills  Pertinent History Reviewed:   Reviewed: Significant for  Medical         Past Medical History:  Diagnosis Date  . Asthma   . Carpal tunnel syndrome   . Gall stones   . Hx of constipation   . Nexplanon insertion 10/11/2013   Inserted left arm 10/11/13 remove 5/27 /18                              Surgical Hx:    Past Surgical History:  Procedure Laterality Date  . CHOLECYSTECTOMY N/A 04/06/2014   Procedure: LAPAROSCOPIC CHOLECYSTECTOMY;  Surgeon: Jamesetta So, MD;  Location: [facility];  Service: General;  Laterality: N/A;  . CYSTOSCOPY W/ URETERAL STENT PLACEMENT    . EXCISION OF SKIN TAG Right 02/28/2019   Procedure: EXCISION OF GLUTEAL SKIN TAG;  Surgeon: Jonnie Kind, MD;  Location: [facility];  Service: Gynecology;  Laterality: Right;  . LAPAROSCOPIC BILATERAL SALPINGECTOMY Bilateral 02/28/2019   Procedure: LAPAROSCOPIC BILATERAL SALPINGECTOMY;  Surgeon: Jonnie Kind, MD;  Location: [facility];  Service: Gynecology;  Laterality: Bilateral;  . NORPLANT REMOVAL Left 02/28/2019   Procedure: REMOVAL OF NORPLANT;  Surgeon: Jonnie Kind, MD;  Location: [facility];  Service: Gynecology;  Laterality: Left;  . TONSILECTOMY, ADENOIDECTOMY, BILATERAL MYRINGOTOMY AND TUBES     age 38   Medications: Reviewed & Updated - see associated section                       Current Outpatient Medications:  .  albuterol (VENTOLIN HFA) 108 (90 Base) MCG/ACT inhaler, Inhale 1 puff into the  lungs every 6 (six) hours as needed for wheezing or shortness of breath., Disp: , Rfl:  .  HYDROcodone-acetaminophen (NORCO/VICODIN) 5-325 MG tablet, Take 1 tablet by mouth every 6 (six) hours as needed for moderate pain. May take with ibuprofen (Patient not taking: Reported on 04/08/2019), Disp: 15 tablet, Rfl: 0 .  ibuprofen (ADVIL) 800 MG tablet, Take 1 tablet (800 mg total) by mouth every 8 (eight) hours as needed for cramping., Disp: 21 tablet, Rfl: 0 .  traMADol (ULTRAM) 50 MG tablet, Take 1 tablet (50 mg total) by mouth every 6 (six) hours as needed for moderate pain., Disp: 10 tablet, Rfl: 0   Social History: Reviewed -  reports that she has been smoking cigarettes. She has a 4.00 pack-year smoking history. She has never used smokeless tobacco.  Objective Findings:  Vitals: There were no vitals taken for this visit.  PHYSICAL EXAMINATION General appearance - alert, well appearing, and in no distress Mental status - alert, oriented to person, place, and time, normal mood, behavior, speech, dress, motor activity, and thought processes  PELVIC Vagina - appears of menses blood Cervix - normal appearing  Uterus - not examied  Assessment & Plan:   A:  1.  DUB 2. 1 month s/p tubal ligation  P:  1.  Megace 40 mg TID until bleeding stops    By signing my name below, I, Arnette Norris, attest that this documentation has been prepared under the direction and in the presence of Tilda Burrow, MD. Electronically Signed: Arnette Norris Medical Scribe. 04/10/19. 1:55 PM.  I personally performed the services described in this documentation, which was SCRIBED in my presence. The recorded information has been reviewed and considered accurate. It has been edited as necessary during review. Tilda Burrow, MD

## 2019-04-10 NOTE — Telephone Encounter (Signed)
Patient called stating that she would like a call back from either a nurse or Dr. Glo Herring. Pt states that she would like to explain what is going on to one of them. Please contact pt

## 2019-04-11 LAB — GC/CHLAMYDIA PROBE AMP (~~LOC~~) NOT AT ARMC
Chlamydia: NEGATIVE
Neisseria Gonorrhea: NEGATIVE

## 2019-12-18 ENCOUNTER — Encounter (HOSPITAL_COMMUNITY): Payer: Self-pay

## 2019-12-18 ENCOUNTER — Other Ambulatory Visit: Payer: Self-pay

## 2019-12-18 ENCOUNTER — Emergency Department (HOSPITAL_COMMUNITY): Payer: Self-pay

## 2019-12-18 DIAGNOSIS — R079 Chest pain, unspecified: Secondary | ICD-10-CM | POA: Insufficient documentation

## 2019-12-18 DIAGNOSIS — Z5321 Procedure and treatment not carried out due to patient leaving prior to being seen by health care provider: Secondary | ICD-10-CM | POA: Insufficient documentation

## 2019-12-18 DIAGNOSIS — Z7982 Long term (current) use of aspirin: Secondary | ICD-10-CM | POA: Insufficient documentation

## 2019-12-18 DIAGNOSIS — M79602 Pain in left arm: Secondary | ICD-10-CM | POA: Insufficient documentation

## 2019-12-18 LAB — CBC
HCT: 47.1 % — ABNORMAL HIGH (ref 36.0–46.0)
Hemoglobin: 16 g/dL — ABNORMAL HIGH (ref 12.0–15.0)
MCH: 30.1 pg (ref 26.0–34.0)
MCHC: 34 g/dL (ref 30.0–36.0)
MCV: 88.7 fL (ref 80.0–100.0)
Platelets: 271 10*3/uL (ref 150–400)
RBC: 5.31 MIL/uL — ABNORMAL HIGH (ref 3.87–5.11)
RDW: 13 % (ref 11.5–15.5)
WBC: 15.1 10*3/uL — ABNORMAL HIGH (ref 4.0–10.5)
nRBC: 0 % (ref 0.0–0.2)

## 2019-12-18 LAB — BASIC METABOLIC PANEL
Anion gap: 10 (ref 5–15)
BUN: 9 mg/dL (ref 6–20)
CO2: 21 mmol/L — ABNORMAL LOW (ref 22–32)
Calcium: 9.1 mg/dL (ref 8.9–10.3)
Chloride: 108 mmol/L (ref 98–111)
Creatinine, Ser: 0.62 mg/dL (ref 0.44–1.00)
GFR calc Af Amer: >60 mL/min (ref >60)
GFR calc non Af Amer: >60 mL/min (ref >60)
Glucose, Bld: 103 mg/dL — ABNORMAL HIGH (ref 70–99)
Potassium: 3.5 mmol/L (ref 3.5–5.1)
Sodium: 139 mmol/L (ref 135–145)

## 2019-12-18 LAB — TROPONIN I (HIGH SENSITIVITY): Troponin I (High Sensitivity): 2 ng/L (ref <18)

## 2019-12-18 NOTE — ED Triage Notes (Signed)
Pt POV from home. compaints of  330 pm chest pain. Took 2 aspirin. Eased off and went to bed. And now her left arm and chest hurts.

## 2019-12-19 ENCOUNTER — Emergency Department (HOSPITAL_COMMUNITY)
Admission: EM | Admit: 2019-12-19 | Discharge: 2019-12-19 | Disposition: A | Discharge status: Home / Self Care | Payer: Self-pay | Attending: Emergency Medicine | Admitting: Emergency Medicine

## 2020-06-26 ENCOUNTER — Other Ambulatory Visit (HOSPITAL_COMMUNITY)
Admission: RE | Admit: 2020-06-26 | Discharge: 2020-06-26 | Disposition: A | Discharge status: Home / Self Care | Payer: Medicaid Other | Source: Ambulatory Visit | Attending: Obstetrics & Gynecology | Admitting: Obstetrics & Gynecology

## 2020-06-26 ENCOUNTER — Telehealth: Payer: Self-pay | Admitting: Women's Health

## 2020-06-26 ENCOUNTER — Encounter: Payer: Self-pay | Admitting: *Deleted

## 2020-06-26 ENCOUNTER — Other Ambulatory Visit: Payer: Self-pay

## 2020-06-26 ENCOUNTER — Other Ambulatory Visit (INDEPENDENT_AMBULATORY_CARE_PROVIDER_SITE_OTHER): Payer: Self-pay | Admitting: *Deleted

## 2020-06-26 DIAGNOSIS — Z113 Encounter for screening for infections with a predominantly sexual mode of transmission: Secondary | ICD-10-CM | POA: Insufficient documentation

## 2020-06-26 NOTE — Progress Notes (Signed)
Chart reviewed for nurse visit. Agree with plan of care.  Adline Potter, NP 06/26/2020 3:52 PM

## 2020-06-26 NOTE — Progress Notes (Signed)
   NURSE VISIT- VAGINITIS/STD  SUBJECTIVE:  Patricia Williamson is a 35 y.o. [id number] GYN patientfemale here for a vaginal swab for vaginitis screening, STD screen.  She reports the following symptoms: stomach tenderness, pain in back for 1 week. Denies abnormal vaginal bleeding, significant pelvic pain, fever, or UTI symptoms.  OBJECTIVE:  There were no vitals taken for this visit.  Appears well, in no apparent distress  ASSESSMENT: Vaginal swab for vaginitis screening  PLAN: Self-collected vaginal probe for Gonorrhea, Chlamydia, Trichomonas, Bacterial Vaginosis, Yeast sent to lab Treatment: to be determined once results are received Follow-up as needed if symptoms persist/worsen, or new symptoms develop  Malachy Mood  06/26/2020 2:27 PM

## 2020-06-26 NOTE — Telephone Encounter (Signed)
Patient called stating that she has been having issues lately after intercourse. Patient states that there is tenderness and sometimes there is bleeding and pain. Patient states that she has family planning medication but she has had a tubal done so she is self pay. Please contact pt

## 2020-06-28 LAB — CERVICOVAGINAL ANCILLARY ONLY
Bacterial Vaginitis (gardnerella): POSITIVE — AB
Candida Glabrata: NEGATIVE
Candida Vaginitis: NEGATIVE
Chlamydia: NEGATIVE
Comment: NEGATIVE
Comment: NEGATIVE
Comment: NEGATIVE
Comment: NEGATIVE
Comment: NEGATIVE
Comment: NORMAL
Neisseria Gonorrhea: NEGATIVE
Trichomonas: NEGATIVE

## 2020-07-01 ENCOUNTER — Telehealth: Payer: Self-pay

## 2020-07-01 MED ORDER — METRONIDAZOLE 500 MG PO TABS: 500.0000 mg | ORAL_TABLET | Freq: Two times a day (BID) | ORAL | 0 refills | Status: DC

## 2020-07-01 NOTE — Telephone Encounter (Signed)
Pt aware swab was + for BV and that med will be sent to pharmacy. Advised no sex or alcohol as long as taking the med. Pt voiced understanding. JSY

## 2020-07-01 NOTE — Telephone Encounter (Signed)
Vaginal swab +BV will rx flagyl 

## 2020-07-01 NOTE — Addendum Note (Signed)
Addended by: Cyril Mourning A on: 07/01/2020 09:56 AM   Modules accepted: Orders

## 2020-07-01 NOTE — Telephone Encounter (Signed)
Pt is wanting someone to call about the results of her labs on 06/26/20.

## 2020-08-06 ENCOUNTER — Telehealth: Payer: Self-pay

## 2020-08-06 NOTE — Telephone Encounter (Signed)
Pt stated that for about 2 weeks she will begin bleeding and then it stops abruptly and is wondering if there is any medication that can be called in or if she needs to come in and be seen

## 2020-08-07 ENCOUNTER — Ambulatory Visit (INDEPENDENT_AMBULATORY_CARE_PROVIDER_SITE_OTHER): Payer: Self-pay | Admitting: Advanced Practice Midwife

## 2020-08-07 ENCOUNTER — Encounter: Payer: Self-pay | Admitting: Advanced Practice Midwife

## 2020-08-07 ENCOUNTER — Other Ambulatory Visit: Payer: Self-pay

## 2020-08-07 VITALS — BP 131/80 | HR 76 | Ht 61.0 in | Wt 182.0 lb

## 2020-08-07 DIAGNOSIS — N926 Irregular menstruation, unspecified: Secondary | ICD-10-CM

## 2020-08-07 MED ORDER — MEGESTROL ACETATE 40 MG PO TABS: 40.0000 mg | ORAL_TABLET | Freq: Three times a day (TID) | ORAL | 2 refills | Status: DC

## 2020-08-07 NOTE — Progress Notes (Signed)
   GYN VISIT Patient name: Patricia Williamson MRN [id number]  Date of birth: May 17, 1986 Chief Complaint:   Menstrual Problem (Irregular bleeding, took megace for this in the past)  History of Present Illness:   Patricia Williamson is a 35 y.o. G72P1102 Caucasian female being seen today for irreg cycles; is just finishing up a cycle lasting 2wks with bad cramps. Had BTL Oct 2020 and has only had a few periods since then. Has taken Megace in the past with good results.    Depression screen [facility] 2/9 07/11/2018 06/23/2018  Decreased Interest 0 0  Down, Depressed, Hopeless 0 0  PHQ - 2 Score 0 0    No LMP recorded. (Menstrual status: Irregular Periods). The current method of family planning is tubal ligation.  Last pap Feb 2020. Results were: NILM w/ HRHPV negative Review of Systems:   Pertinent items are noted in HPI Denies fever/chills, dizziness, headaches, visual disturbances, fatigue, shortness of breath, chest pain, abdominal pain, vomiting, abnormal vaginal discharge/itching/odor/irritation, problems with periods, bowel movements, urination, or intercourse unless otherwise stated above.  Pertinent History Reviewed:  Reviewed past medical,surgical, social, obstetrical and family history.  Reviewed problem list, medications and allergies. Physical Assessment:   Vitals:   08/07/20 1141  BP: 131/80  Pulse: 76  Weight: 182 lb (82.6 kg)  Height: 5\' 1"  (1.549 m)  Body mass index is 34.39 kg/m.       Physical Examination:   General appearance: alert, well appearing, and in no distress  Mental status: alert, oriented to person, place, and time  Skin: warm & dry   Cardiovascular: normal heart rate noted  Respiratory: normal respiratory effort, no distress  Abdomen: soft, non-tender   Pelvic: examination not indicated  Extremities: no edema    No results found for this or any previous visit (from the past 24 hour(s)).  Assessment & Plan:  1) DUB> rx Megace to use prn episodes of bleeding  lasting >7d   Meds:  Meds ordered this encounter  Medications  . megestrol (MEGACE) 40 MG tablet    Sig: Take 1 tablet (40 mg total) by mouth 3 (three) times daily. Til bleeding controlled then once daily x 1 wk.    Dispense:  45 tablet    Refill:  2    Order Specific Question:   Supervising Provider    Answer:   , LUTHER H [[id number]]    No orders of the defined types were placed in this encounter.   Return for Pap & Physical Feb 2023.  Mar 2023 CNM 08/07/2020 12:03 PM

## 2020-08-08 IMAGING — CT CT ABDOMEN AND PELVIS WITH CONTRAST
2 of 4 series · 16 of 46 positions shown, 18 images · IV contrast (omnipaque)
Comparison: None.

CLINICAL DATA: Abdominal pain, primarily right lower quadrant.
Nausea.

EXAM:
CT ABDOMEN AND PELVIS WITH CONTRAST
TECHNIQUE: Multidetector CT imaging of the abdomen and pelvis was performed
using the standard protocol following bolus administration of
intravenous contrast.
CONTRAST:  100mL OMNIPAQUE IOHEXOL 300 MG/ML  SOLN

[Series 2: axial st · axial · 0.74mm/px · z∈[+1103,+1493]mm · 13 of 88 slices shown, 15 images]
[im 5/88  soft-tissue]
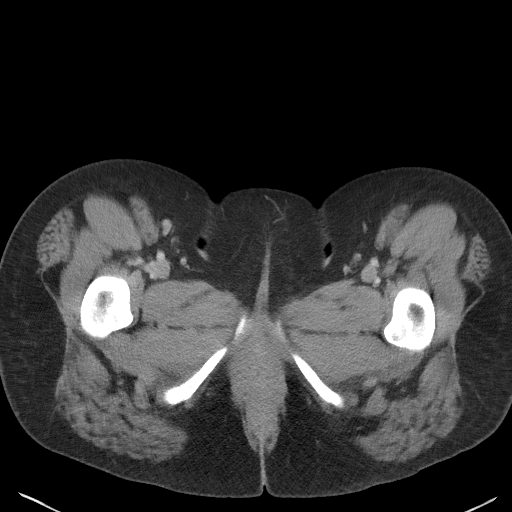
[im 5/88  bone]
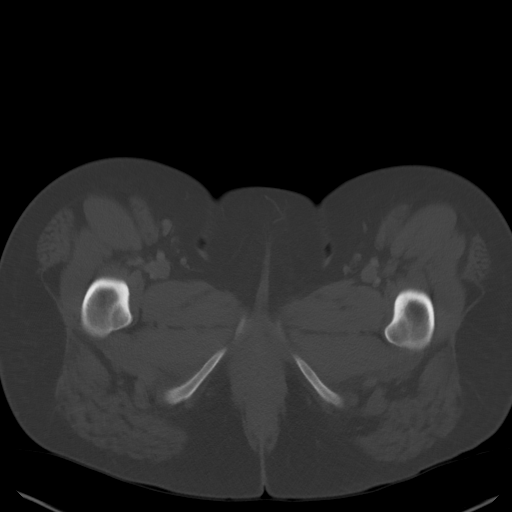
[im 10/88  soft-tissue]
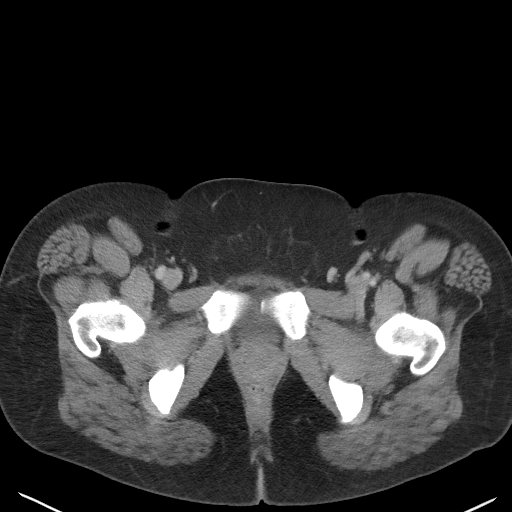
[im 20/88  soft-tissue]
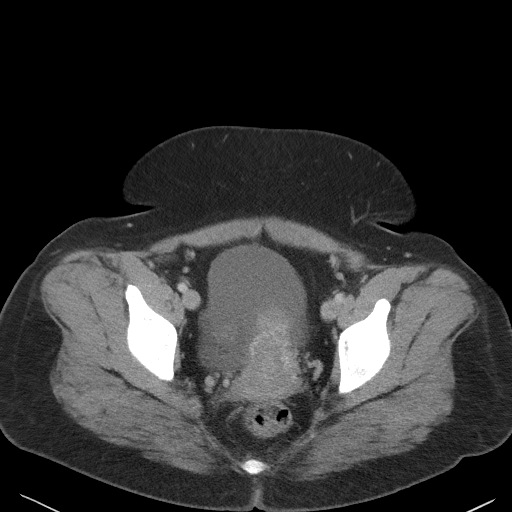
[im 25/88  soft-tissue]
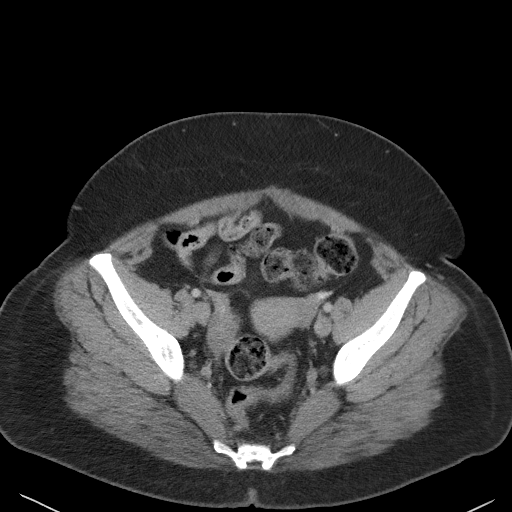
[im 30/88  soft-tissue]
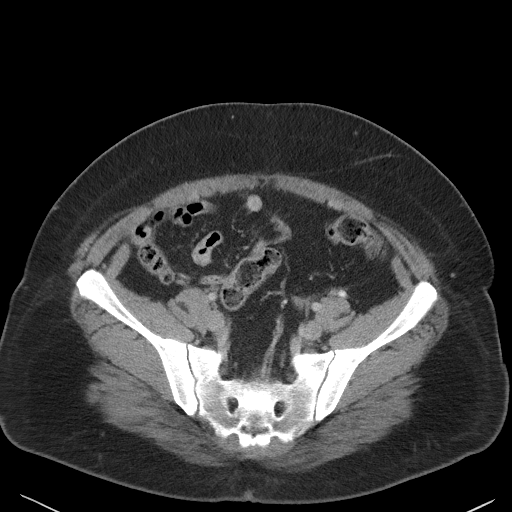
[im 39/88  soft-tissue]
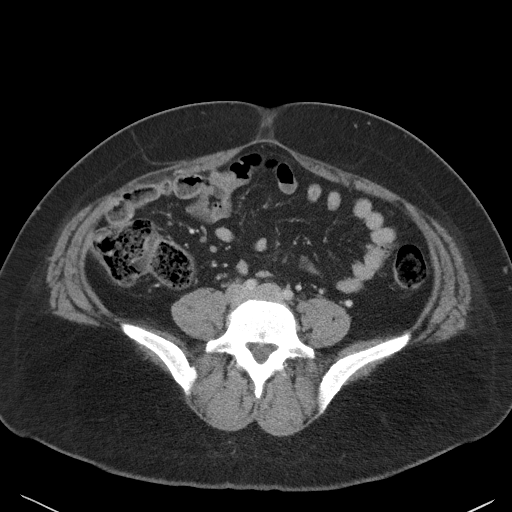
[im 44/88  soft-tissue]
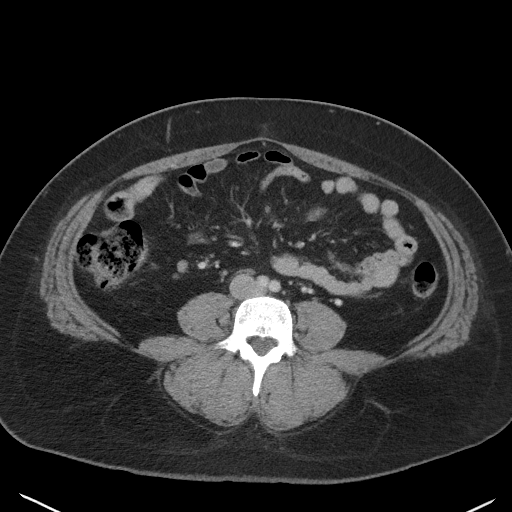
[im 49/88  soft-tissue]
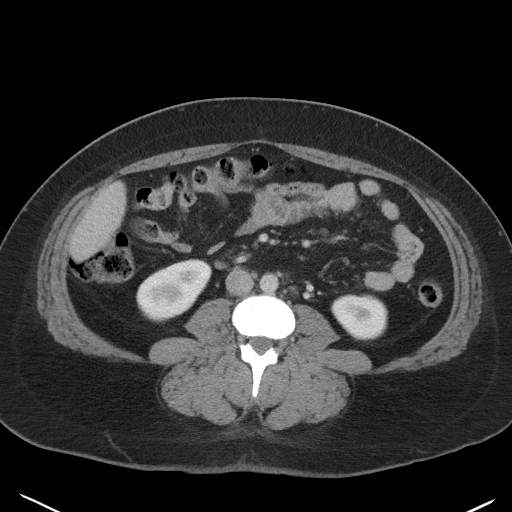
[im 59/88  soft-tissue]
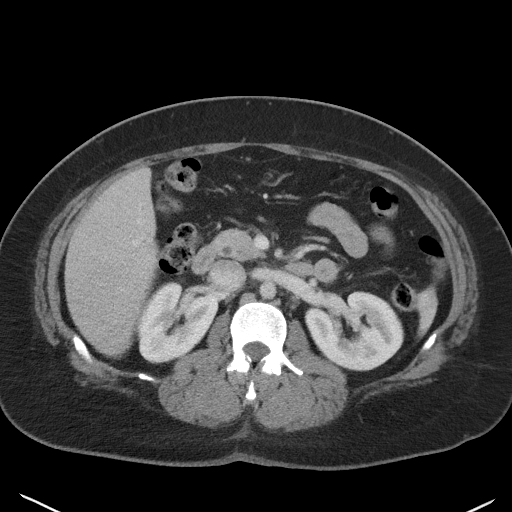
[im 59/88  bone]
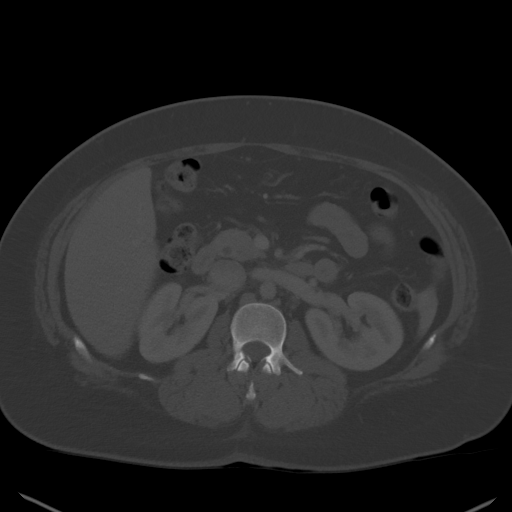
[im 63/88  soft-tissue]
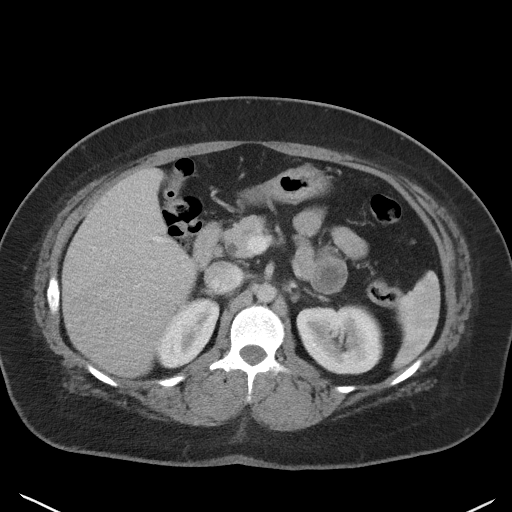
[im 68/88  soft-tissue]
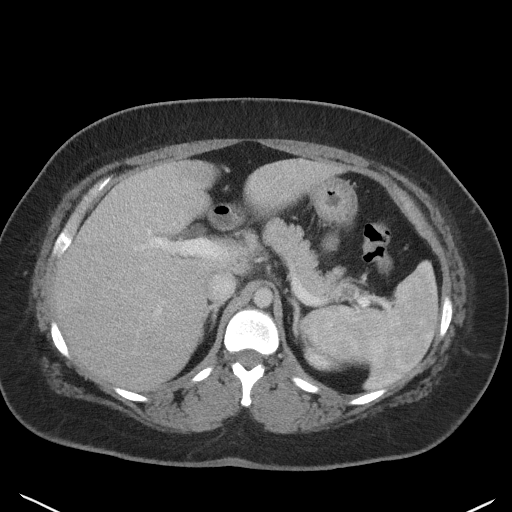
[im 78/88  soft-tissue]
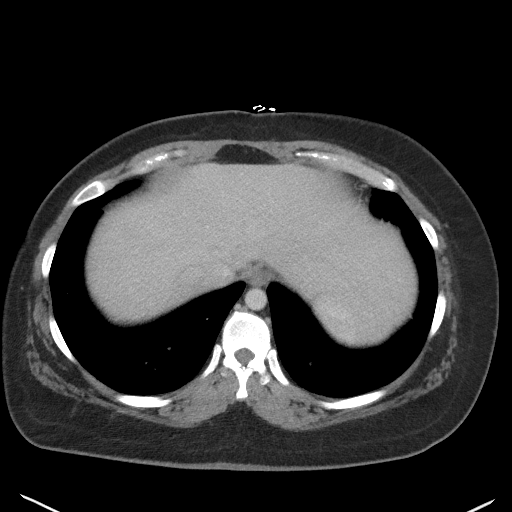
[im 83/88  soft-tissue]
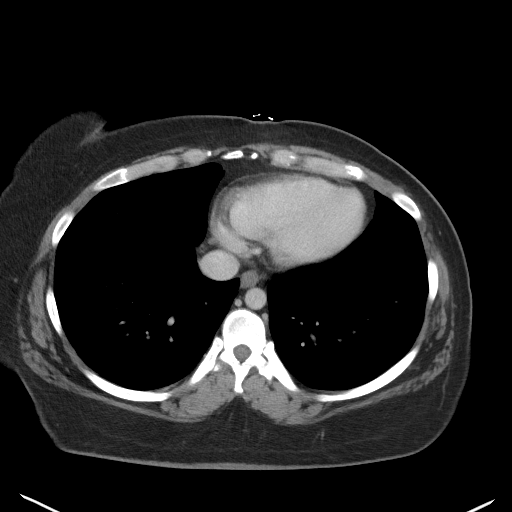

[Series 5: coronal st · coronal · 0.76mm/px · 3 of 97 slices shown]
[im 33/97  soft-tissue]
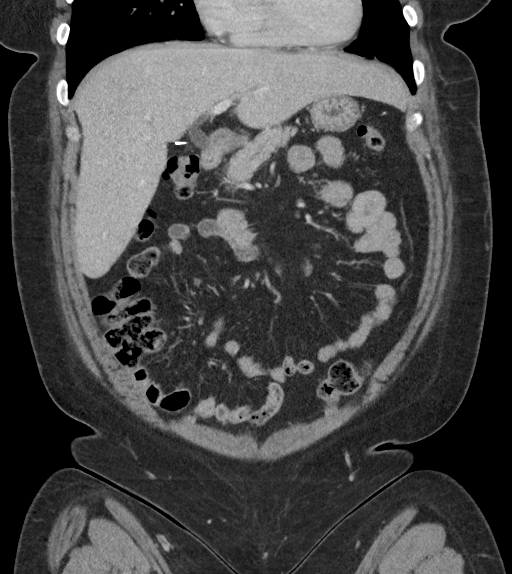
[im 43/97  soft-tissue]
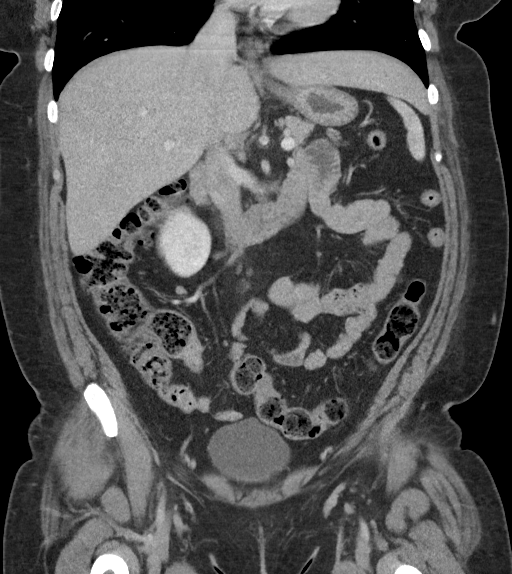
[im 54/97  soft-tissue]
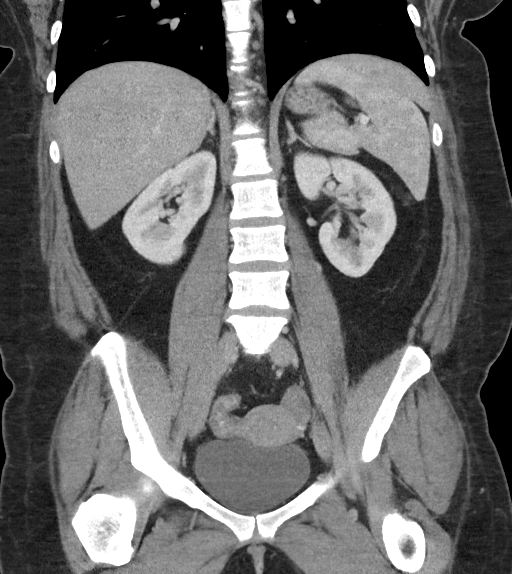

[16 of 46 positions shown; findings below may reference images not displayed]

FINDINGS: Lower chest: There is mild atelectatic change in the lung bases.
There is no edema or consolidation in the lung bases.

Hepatobiliary: There is fatty infiltration near the fissure for the
ligamentum teres. No focal liver lesions are apparent. The
gallbladder is absent. There is no biliary duct dilatation.

Pancreas: There is no pancreatic mass or inflammatory focus.

Spleen: No splenic lesions are evident.

Adrenals/Urinary Tract: Adrenals bilaterally appear normal. Kidneys
bilaterally show no evident mass or hydronephrosis on either side.

Stomach/Bowel: There is no appreciable bowel wall or mesenteric
thickening. No appreciable diverticular disease evident. There is no
appreciable bowel obstruction. Terminal ileum appears unremarkable.
There is no free air or portal venous air.

Vascular/Lymphatic: There is no abdominal aortic aneurysm. No
vascular lesions are evident. There is no evident adenopathy in the
abdomen or pelvis by size criteria. There are several subcentimeter
lymph nodes in the right mid to lower abdomen as well as in the left
retroperitoneal region.

Reproductive: Uterus is anteverted. There is a presumed dominant
follicle arising from the right ovary measuring 2.4 x 2.1 cm. Beyond
this apparent follicle, no pelvic masses are evident.

Other: Appendix appears unremarkable. No evident abscess or ascites
in the abdomen or pelvis. There is a small ventral hernia containing
fat but no bowel.

Musculoskeletal: No blastic or lytic bone lesions. There are no
intramuscular lesions.
IMPRESSION: 1. No evident bowel obstruction. Appendix appears normal. No
diverticulitis. No abscess in the abdomen or pelvis.

2. Subcentimeter lymph nodes in the right mid to lower abdomen. In
the appropriate clinical setting, these lymph nodes may be
indicative of a degree of mesenteric adenitis. No frank adenopathy
by size criteria in the abdomen or pelvis evident on this study.

3. No evident renal or ureteral calculus. No hydronephrosis. Urinary
bladder wall thickness within normal limits.

4.  Gallbladder absent.

## 2021-02-06 ENCOUNTER — Encounter (HOSPITAL_COMMUNITY): Payer: Self-pay | Admitting: Emergency Medicine

## 2021-02-06 ENCOUNTER — Emergency Department (HOSPITAL_COMMUNITY)
Admission: EM | Admit: 2021-02-06 | Discharge: 2021-02-06 | Disposition: A | Discharge status: Home / Self Care | Payer: Medicaid Other | Attending: Emergency Medicine | Admitting: Emergency Medicine

## 2021-02-06 ENCOUNTER — Emergency Department (HOSPITAL_COMMUNITY): Payer: Medicaid Other

## 2021-02-06 ENCOUNTER — Other Ambulatory Visit: Payer: Self-pay

## 2021-02-06 DIAGNOSIS — M79641 Pain in right hand: Secondary | ICD-10-CM

## 2021-02-06 DIAGNOSIS — R52 Pain, unspecified: Secondary | ICD-10-CM

## 2021-02-06 DIAGNOSIS — M779 Enthesopathy, unspecified: Secondary | ICD-10-CM | POA: Insufficient documentation

## 2021-02-06 DIAGNOSIS — M79642 Pain in left hand: Secondary | ICD-10-CM | POA: Insufficient documentation

## 2021-02-06 DIAGNOSIS — F17210 Nicotine dependence, cigarettes, uncomplicated: Secondary | ICD-10-CM | POA: Insufficient documentation

## 2021-02-06 DIAGNOSIS — J45901 Unspecified asthma with (acute) exacerbation: Secondary | ICD-10-CM | POA: Insufficient documentation

## 2021-02-06 DIAGNOSIS — Z716 Tobacco abuse counseling: Secondary | ICD-10-CM | POA: Insufficient documentation

## 2021-02-06 MED ORDER — HYDROCODONE-ACETAMINOPHEN 5-325 MG PO TABS
1.0000 | ORAL_TABLET | Freq: Once | ORAL | Status: AC
  Administered 2021-02-06: 1 via ORAL
  Filled 2021-02-06: qty 1

## 2021-02-06 MED ORDER — IBUPROFEN 800 MG PO TABS: 800.0000 mg | ORAL_TABLET | Freq: Three times a day (TID) | ORAL | 0 refills | Status: DC

## 2021-02-06 MED ORDER — DEXAMETHASONE SODIUM PHOSPHATE 10 MG/ML IJ SOLN
10.0000 mg | Freq: Once | INTRAMUSCULAR | Status: AC
  Administered 2021-02-06: 10 mg
  Filled 2021-02-06: qty 1

## 2021-02-06 MED ORDER — KETOROLAC TROMETHAMINE 30 MG/ML IJ SOLN
30.0000 mg | Freq: Once | INTRAMUSCULAR | Status: AC
  Administered 2021-02-06: 30 mg via INTRAVENOUS
  Filled 2021-02-06: qty 1

## 2021-02-06 NOTE — ED Provider Notes (Signed)
[facility] EMERGENCY DEPARTMENT Provider Note   CSN: [id number] Arrival date & time: 02/06/21  9485     History Chief Complaint  Patient presents with   Hand Pain    Patricia Williamson is a 35 y.o. female.  This is a 35 y.o. female with significant medical history as below, including carpel tunnel syndrome, salpingectomy who presents to the ED with complaint of hand pain b/l  Location: Bilateral hands, forearms Duration: Greater than 2 months Onset: Gradual Timing: Worse in the morning, improved with activity throughout the day Description: Aching, dull Severity: Mild Exacerbating/Alleviating Factors: Worsened with heavy lifting, moving objects, overuse of upper extremities Associated Symptoms:  no shoulder or elbow pain Pertinent Negatives:  no fevers, chills, N/V/D, no cp or dyspnea. No change to bowel/bladder fxn, no abdominal pain. Tolerating PO. No falls    Tried motrin 200mg  which did not help   The history is provided by the patient and a relative. No language interpreter was used.  Hand Pain This is a chronic problem. The current episode started more than 1 week ago. Progression since onset: variable. Pertinent negatives include no chest pain, no abdominal pain, no headaches and no shortness of breath. She has tried acetaminophen for the symptoms.      Past Medical History:  Diagnosis Date   Asthma    Carpal tunnel syndrome    Gall stones    Hx of constipation    Nexplanon insertion 10/11/2013   Inserted left arm 10/11/13 remove 5/27 /18    Patient Active Problem List   Diagnosis Date Noted   Bloody stools 07/25/2015   Encounter for female sterilization procedure 10/11/2013   Abnormal uterine bleeding (AUB) 08/03/2013   Asthma exacerbation 02/22/2013   Bronchitis with asthma, acute 02/22/2013    Past Surgical History:  Procedure Laterality Date   CHOLECYSTECTOMY N/A 04/06/2014   Procedure: LAPAROSCOPIC CHOLECYSTECTOMY;  Surgeon: 04/08/2014, MD;   Location: [facility];  Service: General;  Laterality: N/A;   CYSTOSCOPY W/ URETERAL STENT PLACEMENT     EXCISION OF SKIN TAG Right 02/28/2019   Procedure: EXCISION OF GLUTEAL SKIN TAG;  Surgeon: 03/02/2019, MD;  Location: [facility];  Service: Gynecology;  Laterality: Right;   LAPAROSCOPIC BILATERAL SALPINGECTOMY Bilateral 02/28/2019   Procedure: LAPAROSCOPIC BILATERAL SALPINGECTOMY;  Surgeon: 03/02/2019, MD;  Location: [facility];  Service: Gynecology;  Laterality: Bilateral;   NORPLANT REMOVAL Left 02/28/2019   Procedure: REMOVAL OF NORPLANT;  Surgeon: 03/02/2019, MD;  Location: [facility];  Service: Gynecology;  Laterality: Left;   TONSILECTOMY, ADENOIDECTOMY, BILATERAL MYRINGOTOMY AND TUBES     age 47   TUBAL LIGATION       OB History     Gravida  2   Para  2   Term  1   Preterm  1   AB      Living  2      SAB      IAB      Ectopic      Multiple      Live Births  2           Family History  Problem Relation Age of Onset   Hypertension Mother    Diabetes Mother    Hyperlipidemia Mother    Breast cancer Mother    Hypertension Brother    Diabetes Maternal Grandmother    Heart disease Maternal Grandmother    Other Paternal Aunt  benign breast lump    Social History   Tobacco Use   Smoking status: Every Day    Packs/day: 1.00    Years: 4.00    Pack years: 4.00    Types: Cigarettes   Smokeless tobacco: Never  Vaping Use   Vaping Use: Never used  Substance Use Topics   Alcohol use: No   Drug use: No    Home Medications Prior to Admission medications   Medication Sig Start Date End Date Taking? Authorizing Provider  ibuprofen (ADVIL) 800 MG tablet Take 1 tablet (800 mg total) by mouth 3 (three) times daily. Take with food 02/06/21  Yes Sloan Leiter, DO  megestrol (MEGACE) 40 MG tablet Take 1 tablet (40 mg total) by mouth 3 (three) times daily. Til bleeding controlled then once daily x 1 wk. 08/07/20  Yes Arabella Merles, CNM     Allergies    Vinegar [acetic acid] and Sulfa antibiotics  Review of Systems   Review of Systems  Constitutional:  Negative for chills and fever.  HENT:  Negative for facial swelling and trouble swallowing.   Eyes:  Negative for photophobia and visual disturbance.  Respiratory:  Negative for cough and shortness of breath.   Cardiovascular:  Negative for chest pain and palpitations.  Gastrointestinal:  Negative for abdominal pain, nausea and vomiting.  Endocrine: Negative for polydipsia and polyuria.  Genitourinary:  Negative for difficulty urinating and hematuria.  Musculoskeletal:  Positive for arthralgias. Negative for gait problem and joint swelling.       Hand pain   Skin:  Negative for pallor and rash.  Neurological:  Negative for syncope and headaches.  Psychiatric/Behavioral:  Negative for agitation and confusion.    Physical Exam Updated Vital Signs BP 121/71   Pulse 71   Temp 99.1 F (37.3 C) (Oral)   Resp 20   Ht 5\' 1"  (1.549 m)   Wt 83.9 kg   SpO2 98%   BMI 34.96 kg/m   Physical Exam Vitals and nursing note reviewed.  Constitutional:      General: She is not in acute distress.    Appearance: Normal appearance.  HENT:     Head: Normocephalic and atraumatic.     Right Ear: External ear normal.     Left Ear: External ear normal.     Nose: Nose normal.     Mouth/Throat:     Mouth: Mucous membranes are moist.  Eyes:     General: No scleral icterus.       Right eye: No discharge.        Left eye: No discharge.  Cardiovascular:     Rate and Rhythm: Normal rate and regular rhythm.     Pulses: Normal pulses.     Heart sounds: Normal heart sounds.  Pulmonary:     Effort: Pulmonary effort is normal. No respiratory distress.     Breath sounds: Normal breath sounds.  Abdominal:     General: Abdomen is flat.     Tenderness: There is no abdominal tenderness.  Musculoskeletal:        General: Normal range of motion.     Cervical back: Normal range of  motion.     Right lower leg: No edema.     Left lower leg: No edema.     Comments: Neurovascular intact bilateral upper extremities to radial, ulnar and median nerve distributions.  No evidence of external trauma.  2+ radial pulses symmetric bilateral.  Strength is equal bilateral upper extremities.  Pain with Phalen's testing  Skin:    General: Skin is warm and dry.     Capillary Refill: Capillary refill takes less than 2 seconds.  Neurological:     Mental Status: She is alert.  Psychiatric:        Mood and Affect: Mood normal.        Behavior: Behavior normal.    ED Results / Procedures / Treatments   Labs (all labs ordered are listed, but only abnormal results are displayed) Labs Reviewed - No data to display  EKG None  Radiology DG Hand Complete Left  Result Date: 02/06/2021 CLINICAL DATA:  Bilateral hand pain EXAM: LEFT HAND - COMPLETE 3+ VIEW; RIGHT HAND - COMPLETE 3+ VIEW COMPARISON:  Left ring finger radiographs 06/08/2011 FINDINGS: Left: There is no acute fracture or dislocation. Bony alignment is normal. The joint spaces are preserved. There is no erosive change. The soft tissues are unremarkable. Right: There is no acute fracture or dislocation. Bony alignment is normal. The joint spaces are preserved. There is no erosive change. The soft tissues are unremarkable. IMPRESSION: Normal bilateral hand radiographs. Electronically Signed   By: Lesia Hausen M.D.   On: 02/06/2021 11:07   DG Hand Complete Right  Result Date: 02/06/2021 CLINICAL DATA:  Bilateral hand pain EXAM: LEFT HAND - COMPLETE 3+ VIEW; RIGHT HAND - COMPLETE 3+ VIEW COMPARISON:  Left ring finger radiographs 06/08/2011 FINDINGS: Left: There is no acute fracture or dislocation. Bony alignment is normal. The joint spaces are preserved. There is no erosive change. The soft tissues are unremarkable. Right: There is no acute fracture or dislocation. Bony alignment is normal. The joint spaces are preserved. There is no  erosive change. The soft tissues are unremarkable. IMPRESSION: Normal bilateral hand radiographs. Electronically Signed   By: Lesia Hausen M.D.   On: 02/06/2021 11:07    Procedures Procedures   Medications Ordered in ED Medications  ketorolac (TORADOL) 30 MG/ML injection 30 mg (30 mg Intravenous Given 02/06/21 1040)  dexamethasone (DECADRON) injection 10 mg (10 mg Other Given 02/06/21 1040)  HYDROcodone-acetaminophen (NORCO/VICODIN) 5-325 MG per tablet 1 tablet (1 tablet Oral Given 02/06/21 1040)    ED Course  I have reviewed the triage vital signs and the nursing notes.  Pertinent labs & imaging results that were available during my care of the patient were reviewed by me and considered in my medical decision making (see chart for details).    MDM Rules/Calculators/A&P                         This patient complains of bilateral hand pain; this involves an extensive number of treatment Options and is a complaint that carries with it a high risk of complications and Morbidity.  Vital signs reviewed and are stable.  No external signs of trauma to either affected extremity.  Neurovascularly intact bilateral upper extremities equal and symmetric bilateral.  Serious etiologies considered.   Pain reduced with Phalen's testing.  She does have history of carpal tunnel syndrome.  X-rays reviewed of bilateral hands, is reviewed by myself.  Images are within normal limits.  There is concern for possible tendinitis, musculoskeletal pain to b/l hands.  She reports her symptoms have greatly improved following intervention.  Full range of motion bilateral hands on repeat assessment.  Neurovascular intact bilateral.  Discussed supportive care at home including RICE protocol, NSAIDs.  Close outpatient follow-up with PCP.  Reduce manual movement, rest over the next week.  Work Note provided.  I discussed smoking cessation benefits and risks of ongoing smoking with the patient. Advised her to quit.    The patient improved significantly and was discharged in stable condition. Detailed discussions were had with the patient regarding current findings, and need for close f/u with PCP or on call doctor. The patient has been instructed to return immediately if the symptoms worsen in any way for re-evaluation. Patient verbalized understanding and is in agreement with current care plan. All questions answered prior to discharge.    Final Clinical Impression(s) / ED Diagnoses Final diagnoses:  Tendonitis  Bilateral hand pain  Encounter for smoking cessation counseling    Rx / DC Orders ED Discharge Orders          Ordered    ibuprofen (ADVIL) 800 MG tablet  3 times daily        02/06/21 1223             Sloan Leiter, DO 02/06/21 1237

## 2021-02-06 NOTE — ED Triage Notes (Signed)
Pt states bilat hands throbbing and tingling for a week, denies injury. Denies medical problems.

## 2021-10-01 ENCOUNTER — Emergency Department (HOSPITAL_COMMUNITY): Payer: Self-pay

## 2021-10-01 ENCOUNTER — Encounter (HOSPITAL_COMMUNITY): Payer: Self-pay

## 2021-10-01 ENCOUNTER — Emergency Department (HOSPITAL_COMMUNITY)
Admission: EM | Admit: 2021-10-01 | Discharge: 2021-10-01 | Disposition: A | Discharge status: Home / Self Care | Payer: Self-pay | Attending: Emergency Medicine | Admitting: Emergency Medicine

## 2021-10-01 ENCOUNTER — Other Ambulatory Visit: Payer: Self-pay

## 2021-10-01 DIAGNOSIS — R519 Headache, unspecified: Secondary | ICD-10-CM | POA: Insufficient documentation

## 2021-10-01 LAB — BASIC METABOLIC PANEL
Anion gap: 7 (ref 5–15)
BUN: 9 mg/dL (ref 6–20)
CO2: 24 mmol/L (ref 22–32)
Calcium: 9.3 mg/dL (ref 8.9–10.3)
Chloride: 108 mmol/L (ref 98–111)
Creatinine, Ser: 0.57 mg/dL (ref 0.44–1.00)
GFR, Estimated: >60 mL/min (ref >60)
Glucose, Bld: 96 mg/dL (ref 70–99)
Potassium: 3.8 mmol/L (ref 3.5–5.1)
Sodium: 139 mmol/L (ref 135–145)

## 2021-10-01 LAB — CBC
HCT: 46.4 % — ABNORMAL HIGH (ref 36.0–46.0)
Hemoglobin: 16.2 g/dL — ABNORMAL HIGH (ref 12.0–15.0)
MCH: 31 pg (ref 26.0–34.0)
MCHC: 34.9 g/dL (ref 30.0–36.0)
MCV: 88.9 fL (ref 80.0–100.0)
Platelets: 244 10*3/uL (ref 150–400)
RBC: 5.22 MIL/uL — ABNORMAL HIGH (ref 3.87–5.11)
RDW: 12.6 % (ref 11.5–15.5)
WBC: 9.5 10*3/uL (ref 4.0–10.5)
nRBC: 0 % (ref 0.0–0.2)

## 2021-10-01 MED ORDER — KETOROLAC TROMETHAMINE 30 MG/ML IJ SOLN
30.0000 mg | Freq: Once | INTRAMUSCULAR | Status: AC
  Administered 2021-10-01: 30 mg via INTRAVENOUS
  Filled 2021-10-01: qty 1

## 2021-10-01 MED ORDER — METOCLOPRAMIDE HCL 5 MG/ML IJ SOLN
10.0000 mg | Freq: Once | INTRAMUSCULAR | Status: AC
  Administered 2021-10-01: 10 mg via INTRAVENOUS
  Filled 2021-10-01: qty 2

## 2021-10-01 MED ORDER — DIPHENHYDRAMINE HCL 50 MG/ML IJ SOLN
25.0000 mg | Freq: Once | INTRAMUSCULAR | Status: AC
  Administered 2021-10-01: 25 mg via INTRAVENOUS
  Filled 2021-10-01: qty 1

## 2021-10-01 MED ORDER — DIPHENHYDRAMINE HCL 25 MG PO TABS: 25.0000 mg | ORAL_TABLET | Freq: Three times a day (TID) | ORAL | 0 refills | Status: DC | PRN

## 2021-10-01 MED ORDER — IOHEXOL 300 MG/ML  SOLN
75.0000 mL | Freq: Once | INTRAMUSCULAR | Status: AC | PRN
  Administered 2021-10-01: 75 mL via INTRAVENOUS

## 2021-10-01 MED ORDER — METOCLOPRAMIDE HCL 10 MG PO TABS: 10.0000 mg | ORAL_TABLET | Freq: Three times a day (TID) | ORAL | 0 refills | Status: DC | PRN

## 2021-10-01 MED ORDER — DEXAMETHASONE SODIUM PHOSPHATE 10 MG/ML IJ SOLN
10.0000 mg | Freq: Once | INTRAMUSCULAR | Status: AC
Start: 2021-10-01 — End: 2021-10-01
  Administered 2021-10-01: 10 mg via INTRAVENOUS
  Filled 2021-10-01: qty 1

## 2021-10-01 NOTE — ED Provider Notes (Signed)
?[facility] EMERGENCY DEPARTMENT ?Provider Note ? ? ?CSN: [id number] ?Arrival date & time: 10/01/21  0947 ? ?  ? ?History ? ?Chief Complaint  ?Patient presents with  ? Headache  ? ? ?Patricia Williamson is a 36 y.o. female with a history of migraines presenting to the ED with a headache.  Patient reports that she began having a frontal headache 2 days ago, which was associated with a spike in her blood pressure, and the headache has been persistent for the past 2 days.  She says she does suffer from chronic migraines and these can often last up to 2 weeks, and can sometimes be associated with visual loss or other neurological symptoms, she gets these about once every 46-monthS.  She has not been able to see a neurologist because she has not had insurance.  She ran out of her "migraine medication" although she cannot recall the name of it.  She did take Aleve last night is not helping her pain.  She is also chronically on Megace for irregular menses. ? ?The headache appears to be frontal in nature and that she says radiates around bilaterally around both temples into the back of her neck. ? ?HPI ? ?  ? ?Home Medications ?Prior to Admission medications   ?Medication Sig Start Date End Date Taking? Authorizing Provider  ?diphenhydrAMINE (BENADRYL) 25 MG tablet Take 1 tablet (25 mg total) by mouth every 8 (eight) hours as needed for up to 15 doses. 10/01/21  Yes Naylin Burkle, Kermit Balo, MD  ?metoCLOPramide (REGLAN) 10 MG tablet Take 1 tablet (10 mg total) by mouth every 8 (eight) hours as needed for up to 12 doses for nausea. 10/01/21  Yes Joslyn Ramos, Kermit Balo, MD  ?ibuprofen (ADVIL) 800 MG tablet Take 1 tablet (800 mg total) by mouth 3 (three) times daily. Take with food 02/06/21   Tanda Rockers A, DO  ?megestrol (MEGACE) 40 MG tablet Take 1 tablet (40 mg total) by mouth 3 (three) times daily. Til bleeding controlled then once daily x 1 wk. 08/07/20   Arabella Merles, CNM  ?   ? ?Allergies    ?Vinegar [acetic acid] and Sulfa antibiotics    ? ?Review of Systems   ?Review of Systems ? ?Physical Exam ?Updated Vital Signs ?BP 100/62   Pulse 74   Temp 98.3 ?F (36.8 ?C) (Oral)   Resp 17   Ht 5\' 1"  (1.549 m)   Wt 83 kg   SpO2 95%   BMI 34.58 kg/m?  ?Physical Exam ?Constitutional:   ?   General: She is not in acute distress. ?HENT:  ?   Head: Normocephalic and atraumatic.  ?Eyes:  ?   Conjunctiva/sclera: Conjunctivae normal.  ?   Pupils: Pupils are equal, round, and reactive to light.  ?Cardiovascular:  ?   Rate and Rhythm: Normal rate and regular rhythm.  ?Pulmonary:  ?   Effort: Pulmonary effort is normal. No respiratory distress.  ?Abdominal:  ?   General: There is no distension.  ?   Tenderness: There is no abdominal tenderness.  ?Skin: ?   General: Skin is warm and dry.  ?Neurological:  ?   General: No focal deficit present.  ?   Mental Status: She is alert. Mental status is at baseline.  ?   GCS: GCS eye subscore is 4. GCS verbal subscore is 5. GCS motor subscore is 6.  ?Psychiatric:     ?   Mood and Affect: Mood normal.     ?  Behavior: Behavior normal.  ? ? ?ED Results / Procedures / Treatments   ?Labs ?(all labs ordered are listed, but only abnormal results are displayed) ?Labs Reviewed  ?CBC - Abnormal; Notable for the following components:  ?    Result Value  ? RBC 5.22 (*)   ? Hemoglobin 16.2 (*)   ? HCT 46.4 (*)   ? All other components within normal limits  ?BASIC METABOLIC PANEL  ? ? ?EKG ?None ? ?Radiology ?CT VENOGRAM HEAD ? ?Result Date: 10/01/2021 ?CLINICAL DATA:  Dural venous sinus thrombosis suspected EXAM: CT VENOGRAM HEAD TECHNIQUE: Venographic phase images of the brain were obtained following the administration of intravenous contrast. Multiplanar reformats and maximum intensity projections were generated. RADIATION DOSE REDUCTION: This exam was performed according to the departmental dose-optimization program which includes automated exposure control, adjustment of the mA and/or kV according to patient size and/or use of  iterative reconstruction technique. CONTRAST:  75mL OMNIPAQUE IOHEXOL 300 MG/ML  SOLN COMPARISON:  CT head July 08, 2015. FINDINGS: No evidence of acute large vascular territory infarct, acute hemorrhage, mass lesion, midline shift, or hydrocephalus. No extra-axial fluid collection. Partially empty sella. No evidence of dural venous sinus thrombosis. Superior sagittal sinus, transverse, sigmoid, and straight sinuses are patent. Visualized deep cerebral veins and jugular bulbs are patent. Small/narrowed distal left transverse sinus. IMPRESSION: 1. No evidence of acute intracranial abnormality. 2. No evidence of dural venous sinus thrombosis. 3. Small/narrowed distal left transverse sinus and partially empty sella. These findings are nonspecific and potentially anatomic variants, but can be seen with idiopathic intracranial hypertension. Electronically Signed   By: Feliberto HartsFrederick S Jones M.D.   On: 10/01/2021 13:16   ? ?Procedures ?Procedures  ? ? ?Medications Ordered in ED ?Medications  ?dexamethasone (DECADRON) injection 10 mg (10 mg Intravenous Given 10/01/21 1153)  ?diphenhydrAMINE (BENADRYL) injection 25 mg (25 mg Intravenous Given 10/01/21 1153)  ?ketorolac (TORADOL) 30 MG/ML injection 30 mg (30 mg Intravenous Given 10/01/21 1154)  ?metoCLOPramide (REGLAN) injection 10 mg (10 mg Intravenous Given 10/01/21 1154)  ?iohexol (OMNIPAQUE) 300 MG/ML solution 75 mL (75 mLs Intravenous Contrast Given 10/01/21 1259)  ? ? ?ED Course/ Medical Decision Making/ A&P ?Clinical Course as of 10/01/21 1653  ?Wed Oct 01, 2021  ?1429 Headache is somewhat improved.  Discussed the patient's work-up with her.  No sinus thrombosis noted on CT scan.  There are findings that could be consistent with IIH, she does have the age and body habitus that could also be consistent with this condition.  She also describes intermittent headaches with blurred vision that of been ongoing for some time.  I think is reasonable to place a referral to neurology  and to start her on Diamox at this time, although she does express some concerns about being able to afford both medications and a specialty consultation, she says she should be able to apply for temporary Medicaid.  I explained that this condition really does need a work-up by a specialist, and it is also possible that she is simply suffering from complex migraines, which also needs the attention of a neurologist.  She verbalized understanding.  I do think she is reasonably safe and stable for discharge at this time. [MT]  ?1430 She has no active visual loss at this time and no symptoms of intracranial emergency [MT]  ?1437 Correction, the patient has sulfa allergies and therefore will not be a candidate for Diamox, as there is concern about cross-reactivity. [MT]  ?  ?Clinical Course User Index ?[MT]  Terald Sleeper, MD  ? ?                        ?Medical Decision Making ?Amount and/or Complexity of Data Reviewed ?Labs: ordered. ?Radiology: ordered. ? ?Risk ?OTC drugs. ?Prescription drug management. ? ? ?This patient presents to the Emergency Department with complaint of headache.  This involves an extensive number of treatment options, and is a complaint that carries with it a high risk of complications and morbidity.  The differential diagnosis for headache includes tension type headache vs occipital headache vs migraine vs sinusitis vs intracranial bleed vs other ? ?Patient is also on Megace which does increase her risk of clotting, given the description in the picture and of her headache pain, I have ordered a CT venogram to rule out dural venous thrombosis. ? ?I ordered, reviewed, and interpreted labs, including BMP and CBC.  There were no immediate, life-threatening emergencies found in this labwork.   ?I ordered medication IV medications for headache and/or nausea ?I ordered imaging studies which included CT venogram ?I independently visualized and interpreted imaging which showed no acute  thrombosis ? ?After the interventions stated above, I reevaluated the patient and found that the patient remained clinically stable. ? ?Based on the patient's clinical exam, vital signs, risk factors, and ED testing, I felt

## 2021-10-01 NOTE — ED Triage Notes (Signed)
Patient with complaints of headache for 2 days with history of migraines.  ?

## 2021-10-01 NOTE — Discharge Instructions (Addendum)
We talked about the "possibility" that you may have another cause of her chronic migraines, which could include complex migraine versus idiopathic intracranial hypertension.  I was clear that we are not diagnosing this in the ER.  It is very important that you follow-up and see a specialist for this condition.  If you do experiencing worsening headache with loss of vision or blurred vision, you should return to the emergency room. ? ?I also prescribed you Benadryl and Reglan which are 2 medications and take the other, particularly at night, which can often help with migraines.  These are not long-term medications.  They are intended for use only for the next few days as needed.  You can continue taking ibuprofen and Motrin as needed as well. ?

## 2021-10-10 ENCOUNTER — Other Ambulatory Visit: Payer: Self-pay

## 2021-10-10 ENCOUNTER — Ambulatory Visit
Admission: EM | Admit: 2021-10-10 | Discharge: 2021-10-10 | Disposition: A | Discharge status: Home / Self Care | Payer: Medicaid Other | Attending: Nurse Practitioner | Admitting: Nurse Practitioner

## 2021-10-10 ENCOUNTER — Encounter: Payer: Self-pay | Admitting: Nurse Practitioner

## 2021-10-10 ENCOUNTER — Encounter (HOSPITAL_COMMUNITY): Payer: Self-pay | Admitting: *Deleted

## 2021-10-10 ENCOUNTER — Emergency Department (HOSPITAL_COMMUNITY)
Admission: EM | Admit: 2021-10-10 | Discharge: 2021-10-10 | Disposition: A | Discharge status: Home / Self Care | Payer: Medicaid Other | Attending: Emergency Medicine | Admitting: Emergency Medicine

## 2021-10-10 DIAGNOSIS — R067 Sneezing: Secondary | ICD-10-CM | POA: Insufficient documentation

## 2021-10-10 DIAGNOSIS — R519 Headache, unspecified: Secondary | ICD-10-CM | POA: Insufficient documentation

## 2021-10-10 DIAGNOSIS — J069 Acute upper respiratory infection, unspecified: Secondary | ICD-10-CM

## 2021-10-10 DIAGNOSIS — R059 Cough, unspecified: Secondary | ICD-10-CM | POA: Insufficient documentation

## 2021-10-10 DIAGNOSIS — Z5321 Procedure and treatment not carried out due to patient leaving prior to being seen by health care provider: Secondary | ICD-10-CM | POA: Insufficient documentation

## 2021-10-10 DIAGNOSIS — L299 Pruritus, unspecified: Secondary | ICD-10-CM | POA: Insufficient documentation

## 2021-10-10 MED ORDER — PROMETHAZINE-DM 6.25-15 MG/5ML PO SYRP: 5.0000 mL | ORAL_SOLUTION | Freq: Four times a day (QID) | ORAL | 0 refills | Status: DC | PRN

## 2021-10-10 MED ORDER — CETIRIZINE HCL 10 MG PO TABS: 10.0000 mg | ORAL_TABLET | Freq: Every day | ORAL | 0 refills | Status: DC

## 2021-10-10 MED ORDER — ALBUTEROL SULFATE HFA 108 (90 BASE) MCG/ACT IN AERS: 2.0000 | INHALATION_SPRAY | Freq: Four times a day (QID) | RESPIRATORY_TRACT | 0 refills | Status: DC | PRN

## 2021-10-10 MED ORDER — FLUTICASONE PROPIONATE 50 MCG/ACT NA SUSP: 2.0000 | Freq: Every day | NASAL | 0 refills | Status: DC

## 2021-10-10 NOTE — ED Provider Notes (Signed)
[facility]    CSN: [id number] Arrival date & time: 10/10/21  1313      History   Chief Complaint Chief Complaint  Patient presents with   Cough   sneezing    HPI Patricia Williamson is a 36 y.o. female.   Patient complains of symptoms of a URI. Symptoms include congestion, cough, fever, plugged sensation in both ears, and sore throat. Onset of symptoms was 1 days ago, gradually worsening since that time.  Cough is nonproductive.  Patient reports that she is wheezing and has some shortness of breath.  She also c/o sneezing, itching in ears and watery eyes for the past 1 day .  Patient denies ear pain, and abdominal symptoms.  She is drinking moderate amounts of fluids.  She took a COVID test at work yesterday which was negative.  Patient has been taking ibuprofen, Mucinex, and an over-the-counter cough medication for her symptoms.  Patient reports history of asthma and seasonal allergies.      The history is provided by the patient.   Past Medical History:  Diagnosis Date   Asthma    Carpal tunnel syndrome    Gall stones    Hx of constipation    Nexplanon insertion 10/11/2013   Inserted left arm 10/11/13 remove 5/27 /18    Patient Active Problem List   Diagnosis Date Noted   Bloody stools 07/25/2015   Encounter for female sterilization procedure 10/11/2013   Abnormal uterine bleeding (AUB) 08/03/2013   Asthma exacerbation 02/22/2013   Bronchitis with asthma, acute 02/22/2013    Past Surgical History:  Procedure Laterality Date   CHOLECYSTECTOMY N/A 04/06/2014   Procedure: LAPAROSCOPIC CHOLECYSTECTOMY;  Surgeon: Dalia HeadingMark A Jenkins, MD;  Location: [facility];  Service: General;  Laterality: N/A;   CYSTOSCOPY W/ URETERAL STENT PLACEMENT     EXCISION OF SKIN TAG Right 02/28/2019   Procedure: EXCISION OF GLUTEAL SKIN TAG;  Surgeon: Tilda BurrowFerguson, John V, MD;  Location: [facility];  Service: Gynecology;  Laterality: Right;   LAPAROSCOPIC BILATERAL SALPINGECTOMY Bilateral  02/28/2019   Procedure: LAPAROSCOPIC BILATERAL SALPINGECTOMY;  Surgeon: Tilda BurrowFerguson, John V, MD;  Location: [facility];  Service: Gynecology;  Laterality: Bilateral;   NORPLANT REMOVAL Left 02/28/2019   Procedure: REMOVAL OF NORPLANT;  Surgeon: Tilda BurrowFerguson, John V, MD;  Location: [facility];  Service: Gynecology;  Laterality: Left;   TONSILECTOMY, ADENOIDECTOMY, BILATERAL MYRINGOTOMY AND TUBES     age 89   TUBAL LIGATION      OB History     Gravida  2   Para  2   Term  1   Preterm  1   AB      Living  2      SAB      IAB      Ectopic      Multiple      Live Births  2            Home Medications    Prior to Admission medications   Medication Sig Start Date End Date Taking? Authorizing Provider  albuterol (VENTOLIN HFA) 108 (90 Base) MCG/ACT inhaler Inhale 2 puffs into the lungs every 6 (six) hours as needed for wheezing or shortness of breath. 10/10/21  Yes Kasean Denherder-Warren, Sadie Haberhristie J, NP  cetirizine (ZYRTEC) 10 MG tablet Take 1 tablet (10 mg total) by mouth daily. 10/10/21  Yes Yazleemar Strassner-Warren, Sadie Haberhristie J, NP  fluticasone (FLONASE) 50 MCG/ACT nasal spray Place 2 sprays into both nostrils daily. 10/10/21  Yes Pedrohenrique Mcconville-Warren, Lorene Dyhristie  J, NP  promethazine-dextromethorphan (PROMETHAZINE-DM) 6.25-15 MG/5ML syrup Take 5 mLs by mouth 4 (four) times daily as needed for cough. 10/10/21  Yes Tifini Reeder-Warren, Sadie Haber, NP  diphenhydrAMINE (BENADRYL) 25 MG tablet Take 1 tablet (25 mg total) by mouth every 8 (eight) hours as needed for up to 15 doses. 10/01/21   Terald Sleeper, MD  ibuprofen (ADVIL) 800 MG tablet Take 1 tablet (800 mg total) by mouth 3 (three) times daily. Take with food 02/06/21   Sloan Leiter, DO  megestrol (MEGACE) 40 MG tablet Take 1 tablet (40 mg total) by mouth 3 (three) times daily. Til bleeding controlled then once daily x 1 wk. 08/07/20   Arabella Merles, CNM  metoCLOPramide (REGLAN) 10 MG tablet Take 1 tablet (10 mg total) by mouth every 8 (eight) hours as needed for up to 12  doses for nausea. 10/01/21   Terald Sleeper, MD    Family History Family History  Problem Relation Age of Onset   Hypertension Mother    Diabetes Mother    Hyperlipidemia Mother    Breast cancer Mother    Hypertension Brother    Diabetes Maternal Grandmother    Heart disease Maternal Grandmother    Other Paternal Aunt        benign breast lump    Social History Social History   Tobacco Use   Smoking status: Every Day    Packs/day: 1.00    Years: 4.00    Pack years: 4.00    Types: Cigarettes   Smokeless tobacco: Never  Vaping Use   Vaping Use: Never used  Substance Use Topics   Alcohol use: No   Drug use: No     Allergies   Vinegar [acetic acid] and Sulfa antibiotics   Review of Systems Review of Systems PER HPI  Physical Exam Triage Vital Signs ED Triage Vitals [10/10/21 1403]  Enc Vitals Group     BP 96/65     Pulse Rate 94     Resp 19     Temp 99.2 F (37.3 C)     Temp src      SpO2 95 %     Weight      Height      Head Circumference      Peak Flow      Pain Score 0     Pain Loc      Pain Edu?      Excl. in GC?    No data found.  Updated Vital Signs BP 96/65   Pulse 94   Temp 99.2 F (37.3 C)   Resp 19   SpO2 95%   Visual Acuity Right Eye Distance:   Left Eye Distance:   Bilateral Distance:    Right Eye Near:   Left Eye Near:    Bilateral Near:     Physical Exam Vitals and nursing note reviewed.  Constitutional:      Appearance: Normal appearance.  HENT:     Head: Normocephalic and atraumatic.     Right Ear: Tympanic membrane, ear canal and external ear normal.     Left Ear: Tympanic membrane, ear canal and external ear normal.     Nose: Congestion present.     Right Turbinates: Enlarged and swollen.     Left Turbinates: Enlarged and swollen.     Right Sinus: No maxillary sinus tenderness or frontal sinus tenderness.     Left Sinus: No maxillary sinus tenderness or frontal sinus tenderness.  Mouth/Throat:      Mouth: Mucous membranes are moist.  Eyes:     Extraocular Movements: Extraocular movements intact.     Conjunctiva/sclera: Conjunctivae normal.     Pupils: Pupils are equal, round, and reactive to light.  Cardiovascular:     Rate and Rhythm: Normal rate and regular rhythm.     Pulses: Normal pulses.     Heart sounds: Normal heart sounds.  Pulmonary:     Effort: Pulmonary effort is normal.     Breath sounds: Normal breath sounds.  Abdominal:     General: Bowel sounds are normal.     Palpations: Abdomen is soft.  Musculoskeletal:     Cervical back: Normal range of motion.  Lymphadenopathy:     Cervical: No cervical adenopathy.  Skin:    General: Skin is warm and dry.     Capillary Refill: Capillary refill takes less than 2 seconds.  Neurological:     General: No focal deficit present.     Mental Status: She is alert and oriented to person, place, and time.  Psychiatric:        Mood and Affect: Mood normal.        Behavior: Behavior normal.     UC Treatments / Results  Labs (all labs ordered are listed, but only abnormal results are displayed) Labs Reviewed  COVID-19, FLU A+B NAA    EKG   Radiology No results found.  Procedures Procedures (including critical care time)  Medications Ordered in UC Medications - No data to display  Initial Impression / Assessment and Plan / UC Course  I have reviewed the triage vital signs and the nursing notes.  Pertinent labs & imaging results that were available during my care of the patient were reviewed by me and considered in my medical decision making (see chart for details).  Patient presents with upper respiratory symptoms x1 day.  Patient states symptoms have worsened since the onset.  She complains of fever, chills, cough, nasal congestion, runny nose, sneezing, and bilateral ear fullness.  Her vital signs and exam are reassuring at this time. COVID and flu testing pending.  Symptomatic treatment was provided along with  over-the-counter fever reducers, supportive care.  Strict return precautions were provided, patient's  advised to follow-up as needed.  Final Clinical Impressions(s) / UC Diagnoses   Final diagnoses:  Acute upper respiratory infection     Discharge Instructions      Take medication as prescribed. A COVID/flu test was performed today.  If you have access to MyChart, you will be able to see the results there.  If your results are positive, you will also be contacted. As discussed, your symptoms appear to be viral at this time.  Will provide symptomatic treatment. Recommend increasing fluids and getting plenty of rest. May continue ibuprofen as needed for pain or fever. Recommend using a humidifier during sleep to help with cough. May also use normal saline nasal spray to help with nasal congestion throughout the day. If symptoms worsen or do not improve within the next 7 to 10 days, follow-up in our clinic.     ED Prescriptions     Medication Sig Dispense Auth. Provider   promethazine-dextromethorphan (PROMETHAZINE-DM) 6.25-15 MG/5ML syrup Take 5 mLs by mouth 4 (four) times daily as needed for cough. 140 mL Annaleia Pence-Warren, Sadie Haber, NP   fluticasone (FLONASE) 50 MCG/ACT nasal spray Place 2 sprays into both nostrils daily. 16 g Nirvi Boehler-Warren, Sadie Haber, NP   cetirizine (ZYRTEC) 10 MG  tablet Take 1 tablet (10 mg total) by mouth daily. 30 tablet Jenner Rosier-Warren, Sadie Haber, NP   albuterol (VENTOLIN HFA) 108 (90 Base) MCG/ACT inhaler Inhale 2 puffs into the lungs every 6 (six) hours as needed for wheezing or shortness of breath. 8 g Elmo Rio-Warren, Sadie Haber, NP      PDMP not reviewed this encounter.   Abran Cantor, NP 10/10/21 1421

## 2021-10-10 NOTE — ED Triage Notes (Signed)
Pt presents with c/o cough and sneezing that began yesterday

## 2021-10-10 NOTE — Discharge Instructions (Addendum)
Take medication as prescribed. A COVID/flu test was performed today.  If you have access to MyChart, you will be able to see the results there.  If your results are positive, you will also be contacted. As discussed, your symptoms appear to be viral at this time.  Will provide symptomatic treatment. Recommend increasing fluids and getting plenty of rest. May continue ibuprofen as needed for pain or fever. Recommend using a humidifier during sleep to help with cough. May also use normal saline nasal spray to help with nasal congestion throughout the day. If symptoms worsen or do not improve within the next 7 to 10 days, follow-up in our clinic.

## 2021-10-10 NOTE — ED Triage Notes (Signed)
Pt c/o cough that started yesterday; pt states she has been sneezing and her ears feel "itchy" and c/o headache

## 2021-10-11 LAB — COVID-19, FLU A+B NAA
Influenza A, NAA: NOT DETECTED
Influenza B, NAA: NOT DETECTED
SARS-CoV-2, NAA: NOT DETECTED

## 2021-11-03 ENCOUNTER — Ambulatory Visit: Payer: Medicaid Other | Admitting: Adult Health

## 2021-11-24 ENCOUNTER — Other Ambulatory Visit: Payer: Medicaid Other | Admitting: Adult Health

## 2021-12-12 ENCOUNTER — Encounter: Payer: Self-pay | Admitting: Adult Health

## 2021-12-12 ENCOUNTER — Encounter (INDEPENDENT_AMBULATORY_CARE_PROVIDER_SITE_OTHER): Payer: Self-pay | Admitting: Adult Health

## 2021-12-12 VITALS — BP 123/81 | HR 73 | Ht 61.5 in | Wt 183.5 lb

## 2021-12-12 DIAGNOSIS — Z0000 Encounter for general adult medical examination without abnormal findings: Secondary | ICD-10-CM

## 2021-12-15 NOTE — Progress Notes (Signed)
This encounter was created in error - please disregard.

## 2022-04-22 ENCOUNTER — Other Ambulatory Visit (HOSPITAL_COMMUNITY)
Admission: RE | Admit: 2022-04-22 | Discharge: 2022-04-22 | Disposition: A | Discharge status: Home / Self Care | Payer: Medicaid Other | Source: Ambulatory Visit | Attending: Adult Health | Admitting: Adult Health

## 2022-04-22 ENCOUNTER — Encounter: Payer: Self-pay | Admitting: Adult Health

## 2022-04-22 ENCOUNTER — Ambulatory Visit (INDEPENDENT_AMBULATORY_CARE_PROVIDER_SITE_OTHER): Payer: Medicaid Other | Admitting: Adult Health

## 2022-04-22 VITALS — BP 132/73 | HR 76 | Ht 61.0 in | Wt 191.0 lb

## 2022-04-22 DIAGNOSIS — Z124 Encounter for screening for malignant neoplasm of cervix: Secondary | ICD-10-CM | POA: Insufficient documentation

## 2022-04-22 DIAGNOSIS — N920 Excessive and frequent menstruation with regular cycle: Secondary | ICD-10-CM | POA: Diagnosis not present

## 2022-04-22 DIAGNOSIS — R102 Pelvic and perineal pain: Secondary | ICD-10-CM | POA: Diagnosis not present

## 2022-04-22 NOTE — Progress Notes (Signed)
  Subjective:     Patient ID: Patricia Williamson, female   DOB: Mar 07, 1986, 36 y.o.   MRN: [id number]  HPI Patricia Williamson is a 36 year old white female, married, G2P1102 in complaining of pelvic pain after her period, has gotten worse in last 3 months and makes her feel sick Periods are heavy, may change pads every 2-3 hours and heavy for 3-5 days and lasts about 3-7 days. Periods are regular. She needs a pap.   Review of Systems +pelvic pain after her period, has gotten worse in last 3 months and makes her feel sick Periods are heavy, may change pads every 2-3 hours and heavy for 3-5 days and lasts about 3-7 days. Periods are regular. Constipated at times   Denies any problems with urination,  or pain with sex   Reviewed past medical,surgical, social and family history. Reviewed medications and allergies.  Objective:   Physical Exam BP 132/73 (BP Location: Left Arm, Patient Position: Sitting, Cuff Size: Normal)   Pulse 76   Ht 5\' 1"  (1.549 m)   Wt 191 lb (86.6 kg)   LMP 04/13/2022 (Exact Date)   BMI 36.09 kg/m  Skin warm and dry.Pelvic: external genitalia is normal in appearance no lesions, vagina: pink,urethra has no lesions or masses noted, cervix:smooth and bulbous,pap with GC/CHL and HR HPV genotyping performed, uterus: normal size, shape and contour, mildly tender, no masses felt, adnexa: no masses, + tenderness noted. Bladder is non tender and no masses felt.      Upstream - 04/22/22 1431       Pregnancy Intention Screening   Does the patient want to become pregnant in the next year? No    Does the patient's partner want to become pregnant in the next year? No    Would the patient like to discuss contraceptive options today? No      Contraception Wrap Up   Current Method Female Sterilization    End Method Female Sterilization    Contraception Counseling Provided No            Examination chaperoned by 14/06/23 LPN  Assessment:     1. Pelvic pain +pelvic pain after  her period, has gotten worse in last 3 months and makes her feel sick Will get pelvic Patricia Williamson 12/12/at 12:30 pm at [facility] to assess uterus and ovaries  - [facility] PELVIC COMPLETE WITH TRANSVAGINAL; Future  2. Menorrhagia with regular cycle Periods are heavy, may change pads every 2-3 hours and heavy for 3-5 days and lasts about 3-7 days. Periods are regular Will get pelvic US to assess uterus and ovaries  - US PELVIC COMPLETE WITH TRANSVAGINAL; Future  3. Routine Papanicolaou smear Pap sent Pap in 3 years if normal  - Cytology - PAP( Patricia Williamson)     Plan:    Will talk when [place] results back  Follow up TBD

## 2022-04-27 LAB — CYTOLOGY - PAP
Chlamydia: NEGATIVE
Comment: NEGATIVE
Comment: NEGATIVE
Comment: NORMAL
Diagnosis: NEGATIVE
High risk HPV: NEGATIVE
Neisseria Gonorrhea: NEGATIVE

## 2022-04-28 ENCOUNTER — Ambulatory Visit (HOSPITAL_COMMUNITY)
Admission: RE | Admit: 2022-04-28 | Discharge: 2022-04-28 | Disposition: A | Discharge status: Home / Self Care | Payer: Medicaid Other | Source: Ambulatory Visit | Attending: Adult Health | Admitting: Adult Health

## 2022-04-28 DIAGNOSIS — R102 Pelvic and perineal pain: Secondary | ICD-10-CM | POA: Insufficient documentation

## 2022-04-28 DIAGNOSIS — N920 Excessive and frequent menstruation with regular cycle: Secondary | ICD-10-CM | POA: Insufficient documentation

## 2022-04-30 NOTE — Telephone Encounter (Signed)
Pt to call in am to get appt with Dr Nelda Marseille or Dr Elonda Husky to alk about endometrial ablation, she is not interested in IUD or POPs.

## 2022-05-05 ENCOUNTER — Ambulatory Visit: Payer: Medicaid Other | Admitting: Family Medicine

## 2022-05-05 ENCOUNTER — Encounter: Payer: Self-pay | Admitting: Family Medicine

## 2022-05-05 VITALS — BP 129/85 | HR 76 | Ht 61.0 in | Wt 193.1 lb

## 2022-05-05 DIAGNOSIS — E559 Vitamin D deficiency, unspecified: Secondary | ICD-10-CM

## 2022-05-05 DIAGNOSIS — E038 Other specified hypothyroidism: Secondary | ICD-10-CM | POA: Diagnosis not present

## 2022-05-05 DIAGNOSIS — G43E01 Chronic migraine with aura, not intractable, with status migrainosus: Secondary | ICD-10-CM | POA: Diagnosis not present

## 2022-05-05 DIAGNOSIS — Z1159 Encounter for screening for other viral diseases: Secondary | ICD-10-CM | POA: Diagnosis not present

## 2022-05-05 DIAGNOSIS — G5603 Carpal tunnel syndrome, bilateral upper limbs: Secondary | ICD-10-CM

## 2022-05-05 DIAGNOSIS — E7849 Other hyperlipidemia: Secondary | ICD-10-CM | POA: Diagnosis not present

## 2022-05-05 DIAGNOSIS — Z114 Encounter for screening for human immunodeficiency virus [HIV]: Secondary | ICD-10-CM

## 2022-05-05 DIAGNOSIS — R7301 Impaired fasting glucose: Secondary | ICD-10-CM | POA: Diagnosis not present

## 2022-05-05 DIAGNOSIS — R110 Nausea: Secondary | ICD-10-CM

## 2022-05-05 MED ORDER — RIBOFLAVIN 400 MG PO CAPS: 1.0000 | ORAL_CAPSULE | Freq: Every day | ORAL | 2 refills | Status: DC

## 2022-05-05 MED ORDER — SUMATRIPTAN SUCCINATE 25 MG PO TABS: 25.0000 mg | ORAL_TABLET | ORAL | 0 refills | Status: DC | PRN

## 2022-05-05 MED ORDER — ONDANSETRON HCL 4 MG PO TABS: 4.0000 mg | ORAL_TABLET | Freq: Three times a day (TID) | ORAL | 0 refills | Status: DC | PRN

## 2022-05-05 NOTE — Assessment & Plan Note (Signed)
She denies history of cardiovascular disease Will start patient on triptans for abortive treatment and riboflavin for prophylactic treatment Referral placed to neurology

## 2022-05-05 NOTE — Progress Notes (Signed)
New Patient Office Visit  Subjective:  Patient ID: Patricia Williamson, female    DOB: 24-Nov-1985  Age: 36 y.o. MRN: [id number]  CC:  Chief Complaint  Patient presents with   New Patient (Initial Visit)    Establishing care, hasn't been to doctor since 2015. Pt reports frequent headaches, has been to ED various times for this problem, last migraine was 2 days ago(05/03/2022) the often happen every 2-3 days.    Hand Pain    Pt reports hand pain while she sleeps, onset of this problem has been ongoing since 12 years ago, was diagnosed with carpel tunnel, has worsened since 05/18/2021.     HPI Patricia Williamson is a 36 y.o. female with past medical history of migraine headaches and bilateral carpal tunnel presents for establishing care.  Migraine headaches: History of chronic migraines that often last up to 2 weeks with associated neurological symptoms such as visual loss.  She reports having more than 2 migraines episodes a week.  She was last seen in the ED on 10/01/2021 for migraine headaches and was treated with Zofran and Benadryl, which she reports has had minimal relief of her symptoms.  She notes that the headaches appears occipital and radiates around bilaterally to her frontal.  She complains of nausea with the headaches and reports taking Zofran as needed.  A referral was placed while seen in the ED, but she reports not following up due to her not having medical insurance.  She would like to see a neurologist concerning her chronic migraine headaches.  Bilateral Carpal Tunnel: Chronic.  She complains of bilateral hand pain, swelling, numbness and tingling for 12 years that has worsened since 05/18/2021.  She reports minimal relief of her symptoms with conservative management and use of her hand brace.   Past Medical History:  Diagnosis Date   Asthma    Carpal tunnel syndrome    Gall stones    Hx of constipation    Migraines    Nexplanon insertion 10/11/2013   Inserted left arm 10/11/13  remove 5/27 /18    Past Surgical History:  Procedure Laterality Date   CHOLECYSTECTOMY N/A 04/06/2014   Procedure: LAPAROSCOPIC CHOLECYSTECTOMY;  Surgeon: Jamesetta So, MD;  Location: [facility];  Service: General;  Laterality: N/A;   CYSTOSCOPY W/ URETERAL STENT PLACEMENT     EXCISION OF SKIN TAG Right 02/28/2019   Procedure: EXCISION OF GLUTEAL SKIN TAG;  Surgeon: Jonnie Kind, MD;  Location: [facility];  Service: Gynecology;  Laterality: Right;   LAPAROSCOPIC BILATERAL SALPINGECTOMY Bilateral 02/28/2019   Procedure: LAPAROSCOPIC BILATERAL SALPINGECTOMY;  Surgeon: Jonnie Kind, MD;  Location: [facility];  Service: Gynecology;  Laterality: Bilateral;   NORPLANT REMOVAL Left 02/28/2019   Procedure: REMOVAL OF NORPLANT;  Surgeon: Jonnie Kind, MD;  Location: [facility];  Service: Gynecology;  Laterality: Left;   TONSILECTOMY, ADENOIDECTOMY, BILATERAL MYRINGOTOMY AND TUBES     age 76   TUBAL LIGATION      Family History  Problem Relation Age of Onset   Diabetes Maternal Grandmother    Heart disease Maternal Grandmother    Hypertension Mother    Diabetes Mother    Hyperlipidemia Mother    Breast cancer Mother 81   Hypertension Brother    Other Paternal Aunt        benign breast lump    Social History   Socioeconomic History   Marital status: Married    Spouse name: Not on file   Number  of children: 2   Years of education: Not on file   Highest education level: Not on file  Occupational History   Not on file  Tobacco Use   Smoking status: Every Day    Packs/day: 0.50    Years: 11.00    Total pack years: 5.50    Types: Cigarettes   Smokeless tobacco: Never  Vaping Use   Vaping Use: Never used  Substance and Sexual Activity   Alcohol use: No   Drug use: No   Sexual activity: Yes    Birth control/protection: Surgical    Comment: tubal  Other Topics Concern   Not on file  Social History Narrative   Not on file   Social Determinants of Health   Financial Resource  Strain: Low Risk  (12/12/2021)   Overall Financial Resource Strain (CARDIA)    Difficulty of Paying Living Expenses: Not hard at all  Food Insecurity: No Food Insecurity (12/12/2021)   Hunger Vital Sign    Worried About Running Out of Food in the Last Year: Never true    Ran Out of Food in the Last Year: Never true  Transportation Needs: No Transportation Needs (12/12/2021)   PRAPARE - Hydrologist (Medical): No    Lack of Transportation (Non-Medical): No  Physical Activity: Sufficiently Active (12/12/2021)   Exercise Vital Sign    Days of Exercise per Week: 7 days    Minutes of Exercise per Session: 40 min  Stress: No Stress Concern Present (12/12/2021)   [place]    Feeling of Stress : Only a little  Social Connections: Moderately Isolated (12/12/2021)   Social Connection and Isolation Panel [NHANES]    Frequency of Communication with Friends and Family: More than three times a week    Frequency of Social Gatherings with Friends and Family: Once a week    Attends Religious Services: Never    Marine scientist or Organizations: No    Attends Archivist Meetings: Never    Marital Status: Married  Human resources officer Violence: Not At Risk (12/12/2021)   Humiliation, Afraid, Rape, and Kick questionnaire    Fear of Current or Ex-Partner: No    Emotionally Abused: No    Physically Abused: No    Sexually Abused: No    ROS Review of Systems  Constitutional:  Negative for chills and fever.  Eyes:  Negative for visual disturbance.  Respiratory:  Negative for chest tightness and shortness of breath.   Neurological:  Positive for numbness and headaches. Negative for dizziness.    Objective:   Today's Vitals: BP 129/85   Pulse 76   Ht _0  (1.549 m)   Wt 193 lb 1.9 oz (87.6 kg)   LMP 04/13/2022 (Exact Date)   SpO2 96%   BMI 36.49 kg/m   Physical Exam HENT:     Head:  Normocephalic.     Mouth/Throat:     Mouth: Mucous membranes are moist.  Cardiovascular:     Rate and Rhythm: Normal rate.     Heart sounds: Normal heart sounds.  Pulmonary:     Effort: Pulmonary effort is normal.     Breath sounds: Normal breath sounds.  Musculoskeletal:     Comments: Negative tinel and phalen test  Neurological:     Mental Status: She is alert.      Assessment & Plan:   Carpal tunnel syndrome, bilateral Assessment & Plan: Referral placed  to orthopedic surgery Encouraged to continue conservative management and use of bilateral hand brace  Orders: -     Ambulatory referral to Orthopedic Surgery  Chronic migraine with aura, not intractable, with status migrainosus Assessment & Plan: She denies history of cardiovascular disease Will start patient on triptans for abortive treatment and riboflavin for prophylactic treatment Referral placed to neurology   Chronic migraine with aura and with status migrainosus, not intractable -     Ambulatory referral to Neurology -     SUMAtriptan Succinate; Take 1 tablet (25 mg total) by mouth every 2 (two) hours as needed for migraine. May repeat in 2 hours if headache persists or recurs (max 200 mg per 24-hour period)  Dispense: 30 tablet; Refill: 0 -     Riboflavin; Take 1 capsule (400 mg total) by mouth daily.  Dispense: 30 capsule; Refill: 2  IFG (impaired fasting glucose) -     Hemoglobin A1c  Vitamin D deficiency -     VITAMIN D 25 Hydroxy (Vit-D Deficiency, Fractures)  Need for hepatitis C screening test -     Hepatitis C antibody  Encounter for screening for HIV -     HIV Antibody (routine testing w rflx)  Other specified hypothyroidism -     TSH + free T4  Other hyperlipidemia -     Lipid panel -     CMP14+EGFR -     CBC with Differential/Platelet  Nausea -     Ondansetron HCl; Take 1 tablet (4 mg total) by mouth every 8 (eight) hours as needed for nausea or vomiting.  Dispense: 30 tablet; Refill:  0     Follow-up: Return in about 3 months (around 08/04/2022).   Alvira Monday, FNP

## 2022-05-05 NOTE — Patient Instructions (Addendum)
I appreciate the opportunity to provide care to you today!    Follow up:  3 months  Fasting Labs: please stop by the lab today/ during the week  to get your blood drawn (CBC, CMP, TSH, Lipid profile, HgA1c, Vit D)  Screening: HIV and Hep C   Please pick your medications at the pharmacy  Riboflavin (vitamin B2)- take 400 mg daily to prevent migraine symptoms  Take sumatriptan 25 mg once with fluid, may repeat dose at 2 hours interval (max 200 mg per 24-hour period)  Take sumatriptan at the onset of migraine with a glass of liquid with or without food    Referrals today-  Neurology and orthopedics   Please continue to a heart-healthy diet and increase your physical activities. Try to exercise for at least three times a week.      It was a pleasure to see you and I look forward to continuing to work together on your health and well-being. Please do not hesitate to call the office if you need care or have questions about your care.   Have a wonderful day and week. With Gratitude, Gilmore Laroche MSN, FNP-BC

## 2022-05-05 NOTE — Assessment & Plan Note (Signed)
Referral placed to orthopedic surgery Encouraged to continue conservative management and use of bilateral hand brace

## 2022-05-07 ENCOUNTER — Ambulatory Visit: Payer: Medicaid Other | Admitting: Orthopaedic Surgery

## 2022-05-07 ENCOUNTER — Encounter: Payer: Self-pay | Admitting: Orthopaedic Surgery

## 2022-05-07 VITALS — BP 134/75 | HR 77 | Ht 61.0 in | Wt 193.8 lb

## 2022-05-07 DIAGNOSIS — G5603 Carpal tunnel syndrome, bilateral upper limbs: Secondary | ICD-10-CM | POA: Diagnosis not present

## 2022-05-07 DIAGNOSIS — R200 Anesthesia of skin: Secondary | ICD-10-CM

## 2022-05-07 LAB — CBC WITH DIFFERENTIAL/PLATELET
Basophils Absolute: 0.1 10*3/uL (ref 0.0–0.2)
Basos: 1 %
EOS (ABSOLUTE): 0.1 10*3/uL (ref 0.0–0.4)
Eos: 1 %
Hematocrit: 50 % — ABNORMAL HIGH (ref 34.0–46.6)
Hemoglobin: 16.4 g/dL — ABNORMAL HIGH (ref 11.1–15.9)
Immature Grans (Abs): 0 10*3/uL (ref 0.0–0.1)
Immature Granulocytes: 0 %
Lymphocytes Absolute: 2.6 10*3/uL (ref 0.7–3.1)
Lymphs: 29 %
MCH: 29.8 pg (ref 26.6–33.0)
MCHC: 32.8 g/dL (ref 31.5–35.7)
MCV: 91 fL (ref 79–97)
Monocytes Absolute: 0.5 10*3/uL (ref 0.1–0.9)
Monocytes: 6 %
Neutrophils Absolute: 5.6 10*3/uL (ref 1.4–7.0)
Neutrophils: 63 %
Platelets: 245 10*3/uL (ref 150–450)
RBC: 5.51 x10E6/uL — ABNORMAL HIGH (ref 3.77–5.28)
RDW: 12.2 % (ref 11.7–15.4)
WBC: 9 10*3/uL (ref 3.4–10.8)

## 2022-05-07 LAB — CMP14+EGFR
ALT: 17 IU/L (ref 0–32)
AST: 19 IU/L (ref 0–40)
Albumin/Globulin Ratio: 1.7 (ref 1.2–2.2)
Albumin: 4.7 g/dL (ref 3.9–4.9)
Alkaline Phosphatase: 64 IU/L (ref 44–121)
BUN/Creatinine Ratio: 11 (ref 9–23)
BUN: 6 mg/dL (ref 6–20)
Bilirubin Total: 0.2 mg/dL (ref 0.0–1.2)
CO2: 25 mmol/L (ref 20–29)
Calcium: 9.8 mg/dL (ref 8.7–10.2)
Chloride: 101 mmol/L (ref 96–106)
Creatinine, Ser: 0.55 mg/dL — ABNORMAL LOW (ref 0.57–1.00)
Globulin, Total: 2.7 g/dL (ref 1.5–4.5)
Glucose: 82 mg/dL (ref 70–99)
Potassium: 4.5 mmol/L (ref 3.5–5.2)
Sodium: 140 mmol/L (ref 134–144)
Total Protein: 7.4 g/dL (ref 6.0–8.5)
eGFR: 123 mL/min/{1.73_m2} (ref >59)

## 2022-05-07 LAB — LIPID PANEL
Chol/HDL Ratio: 7.3 ratio — ABNORMAL HIGH (ref 0.0–4.4)
Cholesterol, Total: 262 mg/dL — ABNORMAL HIGH (ref 100–199)
HDL: 36 mg/dL — ABNORMAL LOW (ref >39)
LDL Chol Calc (NIH): 187 mg/dL — ABNORMAL HIGH (ref 0–99)
Triglycerides: 204 mg/dL — ABNORMAL HIGH (ref 0–149)
VLDL Cholesterol Cal: 39 mg/dL (ref 5–40)

## 2022-05-07 LAB — TSH+FREE T4
Free T4: 1.18 ng/dL (ref 0.82–1.77)
TSH: 1.59 u[IU]/mL (ref 0.450–4.500)

## 2022-05-07 LAB — HEMOGLOBIN A1C
Est. average glucose Bld gHb Est-mCnc: 114 mg/dL
Hgb A1c MFr Bld: 5.6 % (ref 4.8–5.6)

## 2022-05-07 LAB — HIV ANTIBODY (ROUTINE TESTING W REFLEX): HIV Screen 4th Generation wRfx: NONREACTIVE

## 2022-05-07 LAB — VITAMIN D 25 HYDROXY (VIT D DEFICIENCY, FRACTURES): Vit D, 25-Hydroxy: 11 ng/mL — ABNORMAL LOW (ref 30.0–100.0)

## 2022-05-07 LAB — HEPATITIS C ANTIBODY: Hep C Virus Ab: NONREACTIVE

## 2022-05-07 NOTE — Progress Notes (Signed)
Subjective:    Patient ID: Patricia Williamson, female    DOB: 03-25-1986, 36 y.o.   MRN: [id number]  HPI She has had increasing numbness and nocturnal pain in both hands over a long time, more than ten years, which has been getting worse recently.  She has just got insurance and wants to have something done for it.  She has no trauma.  She has tried splints, tylenol and Advil with no help.  She has no redness.   Review of Systems  Constitutional:  Positive for activity change.  Respiratory:  Positive for shortness of breath.   Musculoskeletal:  Positive for arthralgias and myalgias.  Neurological:  Positive for headaches.  All other systems reviewed and are negative. For Review of Systems, all other systems reviewed and are negative.  The following is a summary of the past history medically, past history surgically, known current medicines, social history and family history.  This information is gathered electronically by the computer from prior information and documentation.  I review this each visit and have found including this information at this point in the chart is beneficial and informative.   Past Medical History:  Diagnosis Date   Asthma    Carpal tunnel syndrome    Gall stones    Hx of constipation    Migraines    Nexplanon insertion 10/11/2013   Inserted left arm 10/11/13 remove 5/27 /18    Past Surgical History:  Procedure Laterality Date   CHOLECYSTECTOMY N/A 04/06/2014   Procedure: LAPAROSCOPIC CHOLECYSTECTOMY;  Surgeon: Jamesetta So, MD;  Location: [facility];  Service: General;  Laterality: N/A;   CYSTOSCOPY W/ URETERAL STENT PLACEMENT     EXCISION OF SKIN TAG Right 02/28/2019   Procedure: EXCISION OF GLUTEAL SKIN TAG;  Surgeon: Jonnie Kind, MD;  Location: [facility];  Service: Gynecology;  Laterality: Right;   LAPAROSCOPIC BILATERAL SALPINGECTOMY Bilateral 02/28/2019   Procedure: LAPAROSCOPIC BILATERAL SALPINGECTOMY;  Surgeon: Jonnie Kind, MD;  Location: [facility];  Service: Gynecology;  Laterality: Bilateral;   NORPLANT REMOVAL Left 02/28/2019   Procedure: REMOVAL OF NORPLANT;  Surgeon: Jonnie Kind, MD;  Location: [facility];  Service: Gynecology;  Laterality: Left;   TONSILECTOMY, ADENOIDECTOMY, BILATERAL MYRINGOTOMY AND TUBES     age 79   TUBAL LIGATION      Current Outpatient Medications on File Prior to Visit  Medication Sig Dispense Refill   cromolyn (OPTICROM) 4 % ophthalmic solution SMARTSIG:In Eye(s)     ondansetron (ZOFRAN) 4 MG tablet Take 1 tablet (4 mg total) by mouth every 8 (eight) hours as needed for nausea or vomiting. 30 tablet 0   Riboflavin 400 MG CAPS Take 1 capsule (400 mg total) by mouth daily. 30 capsule 2   SUMAtriptan (IMITREX) 25 MG tablet Take 1 tablet (25 mg total) by mouth every 2 (two) hours as needed for migraine. May repeat in 2 hours if headache persists or recurs (max 200 mg per 24-hour period) 30 tablet 0   XIIDRA 5 % SOLN      No current facility-administered medications on file prior to visit.    Social History   Socioeconomic History   Marital status: Married    Spouse name: Not on file   Number of children: 2   Years of education: Not on file   Highest education level: Not on file  Occupational History   Not on file  Tobacco Use   Smoking status: Every Day    Packs/day: 0.50  Years: 11.00    Total pack years: 5.50    Types: Cigarettes   Smokeless tobacco: Never  Vaping Use   Vaping Use: Never used  Substance and Sexual Activity   Alcohol use: No   Drug use: No   Sexual activity: Yes    Birth control/protection: Surgical    Comment: tubal  Other Topics Concern   Not on file  Social History Narrative   Not on file   Social Determinants of Health   Financial Resource Strain: Low Risk  (12/12/2021)   Overall Financial Resource Strain (CARDIA)    Difficulty of Paying Living Expenses: Not hard at all  Food Insecurity: No Food Insecurity (12/12/2021)   Hunger Vital Sign    Worried  About Running Out of Food in the Last Year: Never true    Ran Out of Food in the Last Year: Never true  Transportation Needs: No Transportation Needs (12/12/2021)   PRAPARE - Hydrologist (Medical): No    Lack of Transportation (Non-Medical): No  Physical Activity: Sufficiently Active (12/12/2021)   Exercise Vital Sign    Days of Exercise per Week: 7 days    Minutes of Exercise per Session: 40 min  Stress: No Stress Concern Present (12/12/2021)   [place]    Feeling of Stress : Only a little  Social Connections: Moderately Isolated (12/12/2021)   Social Connection and Isolation Panel [NHANES]    Frequency of Communication with Friends and Family: More than three times a week    Frequency of Social Gatherings with Friends and Family: Once a week    Attends Religious Services: Never    Marine scientist or Organizations: No    Attends Archivist Meetings: Never    Marital Status: Married  Human resources officer Violence: Not At Risk (12/12/2021)   Humiliation, Afraid, Rape, and Kick questionnaire    Fear of Current or Ex-Partner: No    Emotionally Abused: No    Physically Abused: No    Sexually Abused: No    Family History  Problem Relation Age of Onset   Diabetes Maternal Grandmother    Heart disease Maternal Grandmother    Hypertension Mother    Diabetes Mother    Hyperlipidemia Mother    Breast cancer Mother 18   Hypertension Brother    Other Paternal Aunt        benign breast lump    BP 134/75   Pulse 77   Ht 5\' 1"  (1.549 m)   Wt 193 lb 12.8 oz (87.9 kg)   LMP 04/13/2022 (Exact Date)   BMI 36.62 kg/m   Body mass index is 36.62 kg/m.      Objective:   Physical Exam Vitals and nursing note reviewed. Exam conducted with a chaperone present.  Constitutional:      Appearance: She is well-developed.  HENT:     Head: Normocephalic and atraumatic.  Eyes:      Conjunctiva/sclera: Conjunctivae normal.     Pupils: Pupils are equal, round, and reactive to light.  Cardiovascular:     Rate and Rhythm: Normal rate and regular rhythm.  Pulmonary:     Effort: Pulmonary effort is normal.  Abdominal:     Palpations: Abdomen is soft.  Musculoskeletal:       Hands:     Cervical back: Normal range of motion and neck supple.  Skin:    General: Skin is warm and dry.  Neurological:     Mental Status: She is alert and oriented to person, place, and time.     Cranial Nerves: No cranial nerve deficit.     Motor: No abnormal muscle tone.     Coordination: Coordination normal.     Deep Tendon Reflexes: Reflexes are normal and symmetric. Reflexes normal.  Psychiatric:        Behavior: Behavior normal.        Thought Content: Thought content normal.        Judgment: Judgment normal.           Assessment & Plan:   Encounter Diagnoses  Name Primary?   Numbness and tingling in both hands Yes   Carpal tunnel syndrome, bilateral    I will get nerve conduction studies done.  I have explained what carpal tunnel is and that surgery will be the treatment.  Return in two weeks.  Call if any problem.  Precautions discussed.  Electronically Signed Darreld Mclean, MD 12/21/20238:18 AM

## 2022-05-12 ENCOUNTER — Other Ambulatory Visit: Payer: Self-pay | Admitting: Family Medicine

## 2022-05-12 DIAGNOSIS — E559 Vitamin D deficiency, unspecified: Secondary | ICD-10-CM

## 2022-05-12 MED ORDER — VITAMIN D (ERGOCALCIFEROL) 1.25 MG (50000 UNIT) PO CAPS: 50000.0000 [IU] | ORAL_CAPSULE | ORAL | 2 refills | Status: DC

## 2022-05-21 ENCOUNTER — Other Ambulatory Visit: Payer: Self-pay | Admitting: Adult Health

## 2022-05-21 MED ORDER — MEGESTROL ACETATE 40 MG PO TABS: ORAL_TABLET | ORAL | 0 refills | Status: DC

## 2022-05-21 NOTE — Progress Notes (Signed)
Rx megace??  

## 2022-05-22 ENCOUNTER — Ambulatory Visit: Payer: Medicaid Other | Admitting: Obstetrics & Gynecology

## 2022-05-25 ENCOUNTER — Other Ambulatory Visit: Payer: Self-pay

## 2022-05-25 ENCOUNTER — Emergency Department (HOSPITAL_COMMUNITY): Payer: Medicaid Other

## 2022-05-25 ENCOUNTER — Encounter (HOSPITAL_COMMUNITY): Payer: Self-pay

## 2022-05-25 ENCOUNTER — Emergency Department (HOSPITAL_COMMUNITY)
Admission: EM | Admit: 2022-05-25 | Discharge: 2022-05-25 | Disposition: A | Discharge status: Home / Self Care | Payer: Medicaid Other | Attending: Student | Admitting: Student

## 2022-05-25 DIAGNOSIS — J111 Influenza due to unidentified influenza virus with other respiratory manifestations: Secondary | ICD-10-CM

## 2022-05-25 DIAGNOSIS — Z20822 Contact with and (suspected) exposure to covid-19: Secondary | ICD-10-CM | POA: Diagnosis not present

## 2022-05-25 DIAGNOSIS — J45909 Unspecified asthma, uncomplicated: Secondary | ICD-10-CM | POA: Diagnosis not present

## 2022-05-25 DIAGNOSIS — R0602 Shortness of breath: Secondary | ICD-10-CM | POA: Diagnosis not present

## 2022-05-25 DIAGNOSIS — F17210 Nicotine dependence, cigarettes, uncomplicated: Secondary | ICD-10-CM | POA: Diagnosis not present

## 2022-05-25 DIAGNOSIS — R059 Cough, unspecified: Secondary | ICD-10-CM | POA: Diagnosis not present

## 2022-05-25 DIAGNOSIS — J101 Influenza due to other identified influenza virus with other respiratory manifestations: Secondary | ICD-10-CM | POA: Diagnosis not present

## 2022-05-25 LAB — RESP PANEL BY RT-PCR (RSV, FLU A&B, COVID)  RVPGX2
Influenza A by PCR: NEGATIVE
Influenza B by PCR: POSITIVE — AB
Resp Syncytial Virus by PCR: NEGATIVE
SARS Coronavirus 2 by RT PCR: NEGATIVE

## 2022-05-25 MED ORDER — IPRATROPIUM-ALBUTEROL 0.5-2.5 (3) MG/3ML IN SOLN
3.0000 mL | Freq: Once | RESPIRATORY_TRACT | Status: AC
Start: 2022-05-25 — End: 2022-05-25
  Administered 2022-05-25: 3 mL via RESPIRATORY_TRACT
  Filled 2022-05-25: qty 3

## 2022-05-25 MED ORDER — PREDNISONE 50 MG PO TABS
60.0000 mg | ORAL_TABLET | Freq: Once | ORAL | Status: AC
  Administered 2022-05-25: 60 mg via ORAL
  Filled 2022-05-25: qty 1

## 2022-05-25 MED ORDER — PREDNISONE 10 MG PO TABS: 40.0000 mg | ORAL_TABLET | Freq: Every day | ORAL | 0 refills | Status: AC

## 2022-05-25 MED ORDER — ALBUTEROL SULFATE HFA 108 (90 BASE) MCG/ACT IN AERS
1.0000 | INHALATION_SPRAY | Freq: Once | RESPIRATORY_TRACT | Status: AC
  Administered 2022-05-25: 1 via RESPIRATORY_TRACT
  Filled 2022-05-25: qty 6.7

## 2022-05-25 NOTE — ED Provider Notes (Signed)
[facility] EMERGENCY DEPARTMENT Provider Note  CSN: [id number] Arrival date & time: 05/25/22 4098  Chief Complaint(s) Cough  HPI Patricia Williamson is a 37 y.o. female with PMH asthma, migraines, carpal tunnel who presents emergency department for evaluation of cough and shortness of breath.  Patient states symptoms have been present for the last 72 hours and initially improved with over-the-counter medicines like Mucinex and Robitussin but appear to have worsened significant last night.  She states that shortness of breath is worse after a large coughing fit.  She states she does not have an inhaler and she is currently still smoking.  Denies chest pain, abdominal pain, nausea, vomiting or other systemic symptoms.   Past Medical History Past Medical History:  Diagnosis Date   Asthma    Carpal tunnel syndrome    Gall stones    Hx of constipation    Migraines    Nexplanon insertion 10/11/2013   Inserted left arm 10/11/13 remove 5/27 /18   Patient Active Problem List   Diagnosis Date Noted   Carpal tunnel syndrome, bilateral 05/05/2022   Chronic migraine with aura, not intractable, with status migrainosus 05/05/2022   Menorrhagia with regular cycle 04/22/2022   Pelvic pain 04/22/2022   Routine Papanicolaou smear 04/22/2022   Bloody stools 07/25/2015   Encounter for female sterilization procedure 10/11/2013   Abnormal uterine bleeding (AUB) 08/03/2013   Asthma exacerbation 02/22/2013   Bronchitis with asthma, acute 02/22/2013   Home Medication(s) Prior to Admission medications   Medication Sig Start Date End Date Taking? Authorizing Provider  cromolyn (OPTICROM) 4 % ophthalmic solution SMARTSIG:In Eye(s) 05/01/22   [provider]  megestrol (MEGACE) 40 MG tablet Take 3 x 5 days then 2 x 5 days then 1 daily til bleeding stops 05/21/22   Adline Potter, NP  ondansetron (ZOFRAN) 4 MG tablet Take 1 tablet (4 mg total) by mouth every 8 (eight) hours as needed for nausea or  vomiting. 05/05/22   Gilmore Laroche, FNP  Riboflavin 400 MG CAPS Take 1 capsule (400 mg total) by mouth daily. 05/05/22   Gilmore Laroche, FNP  SUMAtriptan (IMITREX) 25 MG tablet Take 1 tablet (25 mg total) by mouth every 2 (two) hours as needed for migraine. May repeat in 2 hours if headache persists or recurs (max 200 mg per 24-hour period) 05/05/22   Gilmore Laroche, FNP  Vitamin D, Ergocalciferol, (DRISDOL) 1.25 MG (50000 UNIT) CAPS capsule Take 1 capsule (50,000 Units total) by mouth every 7 (seven) days. 05/12/22   Gilmore Laroche, FNP  XIIDRA 5 % SOLN  05/01/22   [provider]                                                                                                                                    Past Surgical History Past Surgical History:  Procedure Laterality Date   CHOLECYSTECTOMY N/A 04/06/2014   Procedure: LAPAROSCOPIC CHOLECYSTECTOMY;  Surgeon:  Dalia Heading, MD;  Location: [facility];  Service: General;  Laterality: N/A;   CYSTOSCOPY W/ URETERAL STENT PLACEMENT     EXCISION OF SKIN TAG Right 02/28/2019   Procedure: EXCISION OF GLUTEAL SKIN TAG;  Surgeon: Tilda Burrow, MD;  Location: [facility];  Service: Gynecology;  Laterality: Right;   LAPAROSCOPIC BILATERAL SALPINGECTOMY Bilateral 02/28/2019   Procedure: LAPAROSCOPIC BILATERAL SALPINGECTOMY;  Surgeon: Tilda Burrow, MD;  Location: [facility];  Service: Gynecology;  Laterality: Bilateral;   NORPLANT REMOVAL Left 02/28/2019   Procedure: REMOVAL OF NORPLANT;  Surgeon: Tilda Burrow, MD;  Location: [facility];  Service: Gynecology;  Laterality: Left;   TONSILECTOMY, ADENOIDECTOMY, BILATERAL MYRINGOTOMY AND TUBES     age 37   TUBAL LIGATION     Family History Family History  Problem Relation Age of Onset   Diabetes Maternal Grandmother    Heart disease Maternal Grandmother    Hypertension Mother    Diabetes Mother    Hyperlipidemia Mother    Breast cancer Mother 29   Hypertension Brother    Other Paternal  Aunt        benign breast lump    Social History Social History   Tobacco Use   Smoking status: Every Day    Packs/day: 0.50    Years: 11.00    Total pack years: 5.50    Types: Cigarettes   Smokeless tobacco: Never  Vaping Use   Vaping Use: Never used  Substance Use Topics   Alcohol use: No   Drug use: No   Allergies Vinegar [acetic acid] and Sulfa antibiotics  Review of Systems Review of Systems  Constitutional:  Positive for chills and fatigue.  Respiratory:  Positive for cough and shortness of breath.     Physical Exam Vital Signs  I have reviewed the triage vital signs BP (!) 142/95   Pulse 85   Temp 98.3 F (36.8 C) (Oral)   Resp 18   Ht 5\' 1"  (1.549 m)   Wt 87.1 kg   SpO2 99%   BMI 36.28 kg/m   Physical Exam Vitals and nursing note reviewed.  Constitutional:      General: She is not in acute distress.    Appearance: She is well-developed.  HENT:     Head: Normocephalic and atraumatic.  Eyes:     Conjunctiva/sclera: Conjunctivae normal.  Cardiovascular:     Rate and Rhythm: Normal rate and regular rhythm.     Heart sounds: No murmur heard. Pulmonary:     Effort: Pulmonary effort is normal. No respiratory distress.     Breath sounds: Wheezing present.  Abdominal:     Palpations: Abdomen is soft.     Tenderness: There is no abdominal tenderness.  Musculoskeletal:        General: No swelling.     Cervical back: Neck supple.  Skin:    General: Skin is warm and dry.     Capillary Refill: Capillary refill takes less than 2 seconds.  Neurological:     Mental Status: She is alert.  Psychiatric:        Mood and Affect: Mood normal.     ED Results and Treatments Labs (all labs ordered are listed, but only abnormal results are displayed) Labs Reviewed  RESP PANEL BY RT-PCR (RSV, FLU A&B, COVID)  RVPGX2  Radiology No results  found.  Pertinent labs & imaging results that were available during my care of the patient were reviewed by me and considered in my medical decision making (see MDM for details).  Medications Ordered in ED Medications  ipratropium-albuterol (DUONEB) 0.5-2.5 (3) MG/3ML nebulizer solution 3 mL (has no administration in time range)  predniSONE (DELTASONE) tablet 60 mg (has no administration in time range)                                                                                                                                     Procedures Procedures  (including critical care time)  Medical Decision Making / ED Course   This patient presents to the ED for concern of cough, this involves an extensive number of treatment options, and is a complaint that carries with it a high risk of complications and morbidity.  The differential diagnosis includes influenza, COVID-19, RSV, pneumonia, asthma exacerbation  MDM: Patient seen emerged part for evaluation of multiple complaints as described above.  Physical exam with faint wheezing but no respiratory distress or accessory muscle use.  Chest x-ray unremarkable.  Patient is positive for influenza.  She was given a breathing treatment and prednisone and on reevaluation her symptoms have improved.  Suspect influenza exacerbating underlying asthma and patient was given an albuterol inhaler prior to discharge.  Patient discharged with 4 more days of prednisone and will follow-up outpatient with PCP with return precautions of which she voiced understanding.   Additional history obtained:  -External records from outside source obtained and reviewed including: Chart review including previous notes, labs, imaging, consultation notes   Lab Tests: -I ordered, reviewed, and interpreted labs.   The pertinent results include:   Labs Reviewed  RESP PANEL BY RT-PCR (RSV, FLU A&B, COVID)  RVPGX2         Imaging Studies ordered: I ordered imaging  studies including chest x-ray I independently visualized and interpreted imaging. I agree with the radiologist interpretation   Medicines ordered and prescription drug management: Meds ordered this encounter  Medications   ipratropium-albuterol (DUONEB) 0.5-2.5 (3) MG/3ML nebulizer solution 3 mL   predniSONE (DELTASONE) tablet 60 mg    -I have reviewed the patients home medicines and have made adjustments as needed  Critical interventions none    Cardiac Monitoring: The patient was maintained on a cardiac monitor.  I personally viewed and interpreted the cardiac monitored which showed an underlying rhythm of: NSR, no ST elevations or depressions  Social Determinants of Health:  Factors impacting patients care include: Does not have an albuterol inhaler at home   Reevaluation: After the interventions noted above, I reevaluated the patient and found that they have :improved  Co morbidities that complicate the patient evaluation  Past Medical History:  Diagnosis Date   Asthma    Carpal tunnel syndrome    Gall stones    Hx of constipation  Migraines    Nexplanon insertion 10/11/2013   Inserted left arm 10/11/13 remove 5/27 /18      Dispostion: I considered admission for this patient, but she currently does not meet inpatient criteria for admission and is safe for discharge with outpatient follow-up     Final Clinical Impression(s) / ED Diagnoses Final diagnoses:  None     @PCDICTATION @    , MD 05/25/22 [phone]

## 2022-05-25 NOTE — ED Triage Notes (Signed)
Patient reports runny nose, non-productive cough since Friday night. States that OTC meds are not helping her. Denies pain.

## 2022-05-28 ENCOUNTER — Ambulatory Visit: Payer: Medicaid Other | Admitting: Obstetrics & Gynecology

## 2022-05-28 ENCOUNTER — Encounter: Payer: Self-pay | Admitting: Obstetrics & Gynecology

## 2022-05-28 VITALS — BP 135/84 | HR 94 | Ht 61.0 in | Wt 193.0 lb

## 2022-05-28 DIAGNOSIS — N946 Dysmenorrhea, unspecified: Secondary | ICD-10-CM

## 2022-05-28 DIAGNOSIS — N921 Excessive and frequent menstruation with irregular cycle: Secondary | ICD-10-CM

## 2022-05-28 DIAGNOSIS — G43E01 Chronic migraine with aura, not intractable, with status migrainosus: Secondary | ICD-10-CM | POA: Diagnosis not present

## 2022-05-28 DIAGNOSIS — Z9851 Tubal ligation status: Secondary | ICD-10-CM

## 2022-05-28 MED ORDER — MEGESTROL ACETATE 40 MG PO TABS: 80.0000 mg | ORAL_TABLET | Freq: Two times a day (BID) | ORAL | 4 refills | Status: AC

## 2022-05-28 MED ORDER — IBUPROFEN 600 MG PO TABS: 600.0000 mg | ORAL_TABLET | Freq: Four times a day (QID) | ORAL | 6 refills | Status: DC | PRN

## 2022-05-28 NOTE — Progress Notes (Signed)
GYN VISIT Patient name: Patricia Williamson MRN [id number]  Date of birth: 1985/08/24 Chief Complaint:   Follow-up (Discuss ablation)  History of Present Illness:   Patricia Williamson is a 37 y.o. [phone] female being seen today for follow up regarding  HMB: This has been ongoing issue for the past several years s/p tubal ligation (2020) In the past, she has been on megace and then it improved for quite some time.  Then 2022 noted return of her heavy periods.  Menses are heavy- changing pad every 2-3hrs.  Notes heaviness for 3-5 days and additionally has passage of small clots.  Period lasted for 2 weeks, which is not typically normal for her.  Notes considerable pelvic pain that is along lower pelvis and radiates up.    As a teenager she has tried OCPs, Nexplanon, depot and IUD and with each method noted considerable side effects from pain to weight gain.  Pelvic US 04/28/22: 8.9 x 3.5 x 4.5 cm = volume: 73 mL. Anteverted. Normal morphology without mass.  Normal ovaries bilaterally  Lab work completed- no evidence of anemia  She denies pain outside of her period.  Denies intermenstrual bleeding.  Notes no other acute complaints.     05/05/2022    9:57 AM 12/12/2021   12:13 PM 07/11/2018    9:07 AM 06/23/2018    1:59 PM  Depression screen PHQ 2/9  Decreased Interest 0 0 0 0  Down, Depressed, Hopeless 0 0 0 0  PHQ - 2 Score 0 0 0 0  Altered sleeping 3 3    Tired, decreased energy 1 3    Change in appetite 1 3    Feeling bad or failure about yourself  0 0    Trouble concentrating 0 0    Moving slowly or fidgety/restless 0 0    Suicidal thoughts 0 0    PHQ-9 Score 5 9    Difficult doing work/chores Not difficult at all        Review of Systems:   Pertinent items are noted in HPI Denies fever/chills, dizziness, headaches, visual disturbances, fatigue, shortness of breath, chest pain, vomiting, no problems with bowel movements, urination, or intercourse unless otherwise stated above.   Pertinent History Reviewed:  Reviewed past medical,surgical, social, obstetrical and family history.  Reviewed problem list, medications and allergies. Physical Assessment:   Vitals:   05/28/22 1150  BP: 135/84  Pulse: 94  Weight: 193 lb (87.5 kg)  Height: 5\' 1"  (1.549 m)  Body mass index is 36.47 kg/m.       Physical Examination:   General appearance: alert, well appearing, and in no distress  Psych: mood appropriate, normal affect  Skin: warm & dry   Cardiovascular: normal heart rate noted  Respiratory: normal respiratory effort, no distress  Abdomen: soft, non-tender- no reproducible pain, no rebound, no guarding  Pelvic: examination not indicated  Extremities: no edema   Chaperone: N/A    Assessment & Plan:  1) HMB/AUB -reviewed prior US and reassured pt of normal findings -due to h/o migraines with aura- estrogen contraindicated -reviewed options from TXA, other meds, LARCs or surgical intervention such as ablation or hysterectomy.  Each option risk/benefit discussed -after much discussion she would prefer to avoid surgery and other medication like IUDs, which she noted side effects -plan to continue with Megace prn  2) Dysmenorhea -reviewed conservative therapy including heating pack -also plan for NSAIDs with food  Meds ordered this encounter  Medications   megestrol (  MEGACE) 40 MG tablet    Sig: Take 2 tablets (80 mg total) by mouth 2 (two) times daily.    Dispense:  360 tablet    Refill:  4   ibuprofen (ADVIL) 600 MG tablet    Sig: Take 1 tablet (600 mg total) by mouth every 6 (six) hours as needed for moderate pain or cramping.    Dispense:  30 tablet    Refill:  6      Return in about 1 year (around 05/29/2023) for Annual (due in December).   Janyth Pupa, DO Attending [place], [facility] for [employer], [place]

## 2022-05-29 ENCOUNTER — Ambulatory Visit (INDEPENDENT_AMBULATORY_CARE_PROVIDER_SITE_OTHER): Payer: Medicaid Other | Admitting: Physical Medicine and Rehabilitation

## 2022-05-29 DIAGNOSIS — R202 Paresthesia of skin: Secondary | ICD-10-CM

## 2022-05-29 NOTE — Progress Notes (Signed)
Functional Pain Scale - descriptive words and definitions  Mild (2)   Noticeable when not distracted/no impact on ADL's/sleep only slightly affected and able to   use both passive and active distraction for comfort. Mild range order  Average Pain  varies with activities  Right handed. Pain, Numbness and tingling in both hands. Radiation goes from fingertips to the shoulders Hard to grasp things and has been dropping things. Has swelling in both hands

## 2022-05-31 DIAGNOSIS — H5213 Myopia, bilateral: Secondary | ICD-10-CM | POA: Diagnosis not present

## 2022-06-03 NOTE — Progress Notes (Signed)
Patricia Williamson - 37 y.o. female MRN [id number]  Date of birth: 27-Sep-1985  Office Visit Note: Visit Date: 05/29/2022 PCP: Alvira Monday, FNP Referred by: Sanjuana Kava, MD  Subjective: Chief Complaint  Patient presents with   Right Hand - Pain, Numbness, Weakness   Left Hand - Numbness, Pain, Weakness   HPI:  Patricia Williamson is a 37 y.o. female who comes in today at the request of Dr. Sanjuana Kava for evaluation and management of chronic, worsening and severe pain, numbness and tingling in the Bilateral upper extremities.  Patient is Right hand dominant. She has nocturnal complaints. No prior electrodiagnostics. No frank radicular complaints.   ROS Otherwise per HPI.  Assessment & Plan: Visit Diagnoses:    ICD-10-CM   1. 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   There is no significant electrodiagnostic evidence of any other focal nerve entrapment, brachial plexopathy or cervical radiculopathy  Recommendations: 1.  Follow-up with referring physician. 2.  Continue current management of symptoms. 3.  Suggest surgical evaluation.  Meds & Orders: No orders of the defined types were placed in this encounter.   Orders Placed This Encounter  Procedures   NCV with EMG (electromyography)    Follow-up: Return in about 2 weeks (around 06/12/2022) for Sanjuana Kava, MD.   Procedures: No procedures performed  EMG & NCV Findings: Evaluation of the left median motor and the right median motor nerves showed prolonged distal onset latency (L6.4, R5.5 ms), reduced amplitude (L3.6, R3.2 mV), and decreased conduction velocity (Elbow-Wrist, L46, R44 m/s).  The left median (across palm) sensory nerve showed no response (Palm) and prolonged distal peak latency  (4.7 ms).  The right median (across palm) sensory nerve showed no response (Palm), prolonged distal peak latency (5.4 ms), and reduced amplitude (5.6 V).  All remaining nerves (as indicated in the following tables) were within normal limits.  Left vs. Right side comparison data for the median motor nerve indicates abnormal L-R latency difference (0.9 ms).  The ulnar motor nerve indicates abnormal L-R velocity difference (A Elbow-B Elbow, 44 m/s).  All remaining left vs. right side differences were within normal limits.    Needle evaluation of the right abductor pollicis brevis and the left abductor pollicis brevis muscles showed increased insertional activity and slightly increased spontaneous activity.  All remaining muscles (as indicated in the following table) showed no evidence of electrical instability.    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   There is no significant electrodiagnostic evidence of any other focal nerve entrapment, brachial plexopathy or cervical radiculopathy  Recommendations: 1.  Follow-up with referring physician. 2.  Continue current management of symptoms. 3.  Suggest surgical evaluation.  ___________________________ Laurence Spates FAAPMR Board Certified, American Board of Physical Medicine and Rehabilitation    Nerve Conduction Studies Anti Sensory Summary Table   Stim Site NR Peak (ms) Norm Peak (ms) P-T Amp (V) Norm P-T Amp Site1 Site2 Delta-P (ms) Dist (cm) Vel (m/s) Norm Vel (m/s)  Left Median Acr Palm Anti Sensory (2nd Digit)  30.4C  Wrist    *4.7 <3.6 15.3 >10 Wrist Palm  0.0    Palm *NR  <2.0  Right Median Acr Palm Anti Sensory (2nd Digit)  29.4C  Wrist    *5.4 <3.6 *5.6 >10 Wrist Palm  0.0    Palm *NR  <2.0          Left Radial Anti Sensory (Base 1st Digit)  31C  Wrist    2.0  <3.1 33.1  Wrist Base 1st Digit 2.0 0.0    Right Radial Anti Sensory (Base 1st Digit)  30.4C  Wrist    2.0 <3.1 47.5  Wrist Base 1st Digit 2.0 0.0    Left Ulnar Anti Sensory (5th Digit)  31.1C  Wrist    3.0 <3.7 26.8 >15.0 Wrist 5th Digit 3.0 14.0 47 >38  Right Ulnar Anti Sensory (5th Digit)  30.2C  Wrist    2.8 <3.7 26.0 >15.0 Wrist 5th Digit 2.8 14.0 50 >38   Motor Summary Table   Stim Site NR Onset (ms) Norm Onset (ms) O-P Amp (mV) Norm O-P Amp Site1 Site2 Delta-0 (ms) Dist (cm) Vel (m/s) Norm Vel (m/s)  Left Median Motor (Abd Poll Brev)  31.4C  Wrist    *6.4 <4.2 *3.6 >5 Elbow Wrist 3.8 17.5 *46 >50  Elbow    10.2  3.6         Right Median Motor (Abd Poll Brev)  30.6C  Wrist    *5.5 <4.2 *3.2 >5 Elbow Wrist 4.0 17.5 *44 >50  Elbow    9.5  2.7         Left Ulnar Motor (Abd Dig Min)  31.7C  Wrist    2.4 <4.2 10.0 >3 B Elbow Wrist 3.1 17.0 55 >53  B Elbow    5.5  9.3  A Elbow B Elbow 1.5 10.0 67 >53  A Elbow    7.0  8.6         Right Ulnar Motor (Abd Dig Min)  30.8C  Wrist    2.7 <4.2 9.3 >3 B Elbow Wrist 3.0 18.0 60 >53  B Elbow    5.7  8.3  A Elbow B Elbow 0.9 10.0 111 >53  A Elbow    6.6  8.4          EMG   Side Muscle Nerve Root Ins Act Fibs Psw Amp Dur Poly Recrt Int Dennie Bible Comment  Right Abd Poll Brev Median C8-T1 *Incr *1+ *1+ Nml Nml 0 Nml Nml   Right 1stDorInt Ulnar C8-T1 Nml Nml Nml Nml Nml 0 Nml Nml   Left Abd Poll Brev Median C8-T1 *Incr *1+ *1+ Nml Nml 0 Nml Nml     Nerve Conduction Studies Anti Sensory Left/Right Comparison   Stim Site L Lat (ms) R Lat (ms) L-R Lat (ms) L Amp (V) R Amp (V) L-R Amp (%) Site1 Site2 L Vel (m/s) R Vel (m/s) L-R Vel (m/s)  Median Acr Palm Anti Sensory (2nd Digit)  30.4C  Wrist *4.7 *5.4 0.7 15.3 *5.6 63.4 Wrist Palm     Palm             Radial Anti Sensory (Base 1st Digit)  31C  Wrist 2.0 2.0 0.0 33.1 47.5 30.3 Wrist Base 1st Digit     Ulnar Anti Sensory (5th Digit)  31.1C  Wrist 3.0 2.8 0.2 26.8 26.0 3.0 Wrist 5th Digit  47 50 3   Motor Left/Right Comparison   Stim Site L Lat (ms) R Lat (ms) L-R Lat (ms) L Amp (mV) R Amp (mV) L-R Amp (%) Site1 Site2 L Vel (m/s) R Vel (m/s) L-R Vel (  m/s)  Median Motor (Abd Poll Brev)  31.4C  Wrist *6.4 *5.5 *0.9 *3.6 *3.2 11.1 Elbow Wrist *46 *44 2  Elbow 10.2 9.5 0.7 3.6 2.7 25.0       Ulnar Motor (Abd Dig Min)  31.7C  Wrist 2.4 2.7 0.3 10.0 9.3 7.0 B Elbow Wrist 55 60 5  B Elbow 5.5 5.7 0.2 9.3 8.3 10.8 A Elbow B Elbow 67 111 *44  A Elbow 7.0 6.6 0.4 8.6 8.4 2.3          Waveforms:                      Clinical History: No specialty comments available.     Objective:  VS:  HT:    WT:   BMI:     BP:   HR: bpm  TEMP: ( )  RESP:  Physical Exam Musculoskeletal:        General: No swelling, tenderness or deformity.     Comments: Inspection reveals no atrophy of the bilateral APB or FDI or hand intrinsics. There is no swelling, color changes, allodynia or dystrophic changes. There is 5 out of 5 strength in the bilateral wrist extension, finger abduction and long finger flexion. There is intact sensation to light touch in all dermatomal and peripheral nerve distributions. There is a negative Froment's test bilaterally. There is a negative Tinel's test at the bilateral wrist and elbow. There is a positive Phalen's test bilaterally. There is a negative Hoffmann's test bilaterally.  Skin:    General: Skin is warm and dry.     Findings: No erythema or rash.  Neurological:     General: No focal deficit present.     Mental Status: She is alert and oriented to person, place, and time.     Motor: No weakness or abnormal muscle tone.     Coordination: Coordination normal.  Psychiatric:        Mood and Affect: Mood normal.        Behavior: Behavior normal.      Imaging: No results found.

## 2022-06-03 NOTE — Procedures (Signed)
EMG & NCV Findings: Evaluation of the left median motor and the right median motor nerves showed prolonged distal onset latency (L6.4, R5.5 ms), reduced amplitude (L3.6, R3.2 mV), and decreased conduction velocity (Elbow-Wrist, L46, R44 m/s).  The left median (across palm) sensory nerve showed no response (Palm) and prolonged distal peak latency (4.7 ms).  The right median (across palm) sensory nerve showed no response (Palm), prolonged distal peak latency (5.4 ms), and reduced amplitude (5.6 V).  All remaining nerves (as indicated in the following tables) were within normal limits.  Left vs. Right side comparison data for the median motor nerve indicates abnormal L-R latency difference (0.9 ms).  The ulnar motor nerve indicates abnormal L-R velocity difference (A Elbow-B Elbow, 44 m/s).  All remaining left vs. right side differences were within normal limits.    Needle evaluation of the right abductor pollicis brevis and the left abductor pollicis brevis muscles showed increased insertional activity and slightly increased spontaneous activity.  All remaining muscles (as indicated in the following table) showed no evidence of electrical instability.    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   There is no significant electrodiagnostic evidence of any other focal nerve entrapment, brachial plexopathy or cervical radiculopathy  Recommendations: 1.  Follow-up with referring physician. 2.  Continue current management of symptoms. 3.  Suggest surgical evaluation.  ___________________________ Laurence Spates FAAPMR Board Certified, American Board of Physical Medicine and Rehabilitation    Nerve Conduction Studies Anti Sensory Summary Table   Stim Site NR Peak (ms) Norm Peak (ms) P-T Amp (V) Norm P-T Amp Site1 Site2 Delta-P (ms) Dist (cm)  Vel (m/s) Norm Vel (m/s)  Left Median Acr Palm Anti Sensory (2nd Digit)  30.4C  Wrist    *4.7 <3.6 15.3 >10 Wrist Palm  0.0    Palm *NR  <2.0          Right Median Acr Palm Anti Sensory (2nd Digit)  29.4C  Wrist    *5.4 <3.6 *5.6 >10 Wrist Palm  0.0    Palm *NR  <2.0          Left Radial Anti Sensory (Base 1st Digit)  31C  Wrist    2.0 <3.1 33.1  Wrist Base 1st Digit 2.0 0.0    Right Radial Anti Sensory (Base 1st Digit)  30.4C  Wrist    2.0 <3.1 47.5  Wrist Base 1st Digit 2.0 0.0    Left Ulnar Anti Sensory (5th Digit)  31.1C  Wrist    3.0 <3.7 26.8 >15.0 Wrist 5th Digit 3.0 14.0 47 >38  Right Ulnar Anti Sensory (5th Digit)  30.2C  Wrist    2.8 <3.7 26.0 >15.0 Wrist 5th Digit 2.8 14.0 50 >38   Motor Summary Table   Stim Site NR Onset (ms) Norm Onset (ms) O-P Amp (mV) Norm O-P Amp Site1 Site2 Delta-0 (ms) Dist (cm) Vel (m/s) Norm Vel (m/s)  Left Median Motor (Abd Poll Brev)  31.4C  Wrist    *6.4 <4.2 *3.6 >5 Elbow Wrist 3.8 17.5 *46 >50  Elbow    10.2  3.6         Right Median Motor (Abd Poll Brev)  30.6C  Wrist    *5.5 <4.2 *3.2 >5 Elbow Wrist 4.0 17.5 *44 >50  Elbow    9.5  2.7         Left Ulnar Motor (  Abd Dig Min)  31.7C  Wrist    2.4 <4.2 10.0 >3 B Elbow Wrist 3.1 17.0 55 >53  B Elbow    5.5  9.3  A Elbow B Elbow 1.5 10.0 67 >53  A Elbow    7.0  8.6         Right Ulnar Motor (Abd Dig Min)  30.8C  Wrist    2.7 <4.2 9.3 >3 B Elbow Wrist 3.0 18.0 60 >53  B Elbow    5.7  8.3  A Elbow B Elbow 0.9 10.0 111 >53  A Elbow    6.6  8.4          EMG   Side Muscle Nerve Root Ins Act Fibs Psw Amp Dur Poly Recrt Int Fraser Din Comment  Right Abd Poll Brev Median C8-T1 *Incr *1+ *1+ Nml Nml 0 Nml Nml   Right 1stDorInt Ulnar C8-T1 Nml Nml Nml Nml Nml 0 Nml Nml   Left Abd Poll Brev Median C8-T1 *Incr *1+ *1+ Nml Nml 0 Nml Nml     Nerve Conduction Studies Anti Sensory Left/Right Comparison   Stim Site L Lat (ms) R Lat (ms) L-R Lat (ms) L Amp (V) R Amp (V) L-R Amp (%) Site1 Site2 L  Vel (m/s) R Vel (m/s) L-R Vel (m/s)  Median Acr Palm Anti Sensory (2nd Digit)  30.4C  Wrist *4.7 *5.4 0.7 15.3 *5.6 63.4 Wrist Palm     Palm             Radial Anti Sensory (Base 1st Digit)  31C  Wrist 2.0 2.0 0.0 33.1 47.5 30.3 Wrist Base 1st Digit     Ulnar Anti Sensory (5th Digit)  31.1C  Wrist 3.0 2.8 0.2 26.8 26.0 3.0 Wrist 5th Digit 47 50 3   Motor Left/Right Comparison   Stim Site L Lat (ms) R Lat (ms) L-R Lat (ms) L Amp (mV) R Amp (mV) L-R Amp (%) Site1 Site2 L Vel (m/s) R Vel (m/s) L-R Vel (m/s)  Median Motor (Abd Poll Brev)  31.4C  Wrist *6.4 *5.5 *0.9 *3.6 *3.2 11.1 Elbow Wrist *46 *44 2  Elbow 10.2 9.5 0.7 3.6 2.7 25.0       Ulnar Motor (Abd Dig Min)  31.7C  Wrist 2.4 2.7 0.3 10.0 9.3 7.0 B Elbow Wrist 55 60 5  B Elbow 5.5 5.7 0.2 9.3 8.3 10.8 A Elbow B Elbow 67 111 *44  A Elbow 7.0 6.6 0.4 8.6 8.4 2.3          Waveforms:

## 2022-06-04 ENCOUNTER — Encounter: Payer: Self-pay | Admitting: Orthopaedic Surgery

## 2022-06-04 ENCOUNTER — Ambulatory Visit: Payer: Medicaid Other | Admitting: Orthopaedic Surgery

## 2022-06-04 VITALS — BP 136/84 | HR 71 | Ht 61.0 in | Wt 193.0 lb

## 2022-06-04 DIAGNOSIS — G5603 Carpal tunnel syndrome, bilateral upper limbs: Secondary | ICD-10-CM

## 2022-06-04 NOTE — Patient Instructions (Addendum)
 You will be scheduled to see Dr.Harrison or Dr.Cairns, who is in our office, to discuss surgery for CTS. They will discuss the next steps regarding surgery. You can have your RIGHT hand done first because that is the most painful one.   My name is  and I assist [place]. If you need anything before your next appointment, please do not hesitate to call the office at [phone] and ask to leave a message for me. I will respond within 24-48 business hours.

## 2022-06-04 NOTE — Progress Notes (Signed)
My hands are worse.  She had EMGs showing severe bilateral carpal tunnel syndrome.  She will need surgery.  I have discussed the procedure with her.  I will have Dr. Aline Brochure or Dr. Amedeo Kinsman see her.  She will need to let her work know.  She says they will release her from work then rehire her later.  She has positive Tinel and Phalen signs of both hands, decreased sensation of median nerve bilaterally.  Encounter Diagnosis  Name Primary?   Carpal tunnel syndrome, bilateral Yes   To see Dr. Lemmie Evens or Dr. Loletha Grayer.  Call if any problem.  Precautions discussed.  Electronically Signed Sanjuana Kava, MD 1/18/20248:56 AM

## 2022-06-05 ENCOUNTER — Ambulatory Visit: Payer: Medicaid Other | Admitting: Family Medicine

## 2022-06-05 ENCOUNTER — Encounter: Payer: Self-pay | Admitting: Family Medicine

## 2022-06-05 VITALS — BP 106/70 | HR 77 | Ht 61.0 in | Wt 196.1 lb

## 2022-06-05 DIAGNOSIS — R1310 Dysphagia, unspecified: Secondary | ICD-10-CM | POA: Diagnosis not present

## 2022-06-05 DIAGNOSIS — L040 Acute lymphadenitis of face, head and neck: Secondary | ICD-10-CM | POA: Diagnosis not present

## 2022-06-05 DIAGNOSIS — J029 Acute pharyngitis, unspecified: Secondary | ICD-10-CM

## 2022-06-05 LAB — POCT RAPID STREP A

## 2022-06-05 MED ORDER — PENICILLIN V POTASSIUM 500 MG PO TABS: 500.0000 mg | ORAL_TABLET | Freq: Three times a day (TID) | ORAL | 0 refills | Status: DC

## 2022-06-05 MED ORDER — PANTOPRAZOLE SODIUM 20 MG PO TBEC: 20.0000 mg | DELAYED_RELEASE_TABLET | Freq: Every day | ORAL | 0 refills | Status: DC

## 2022-06-05 NOTE — Assessment & Plan Note (Signed)
1 year h/o solid dysphagia and choking on food, refer GI and breath test, trial of protonix, h/o Jerrye Bushy

## 2022-06-05 NOTE — Patient Instructions (Signed)
F/U with PCP as before, call if you need to be seen sooner  You are treated for sore throat and cervical adenitis, penicillin is prescribed Use ibuprofen alternating with tylenol 325 mg for pain for the next 3 days as needed, after that pain should be much less  I have prescribed one month only of medication for reflux, and I have also referred you to a Gastroenterologist   Pleae avoid caffeine products  Thanks for choosing [facility], we consider it a privelige to serve you.

## 2022-06-05 NOTE — Assessment & Plan Note (Addendum)
Pen v x 10 days 

## 2022-06-07 DIAGNOSIS — J029 Acute pharyngitis, unspecified: Secondary | ICD-10-CM | POA: Insufficient documentation

## 2022-06-07 NOTE — Progress Notes (Signed)
   Patricia Williamson     MRN: [id number]      DOB: 1986-03-01   HPI C/o  a 10 day h/o jaw and neck pain, no fever or chills, denies sinus pressure or drainage, no productive cough C/o painful swallowing and food sticking, choking on solids for past year  ROS  Denies chest pains, palpitations and leg swelling Denies abdominal pain, nausea, vomiting,diarrhea or constipation.   Denies dysuria, frequency, hesitancy or incontinence. Denies joint pain, swelling and limitation in mobility. Denies headaches, seizures, numbness, or tingling. C/o  depression, and mild anxiety  Denies skin break down or rash.   PE  BP 106/70 (BP Location: Right Arm, Patient Position: Sitting, Cuff Size: Large)   Pulse 77   Ht 5\' 1"  (1.549 m)   Wt 196 lb 1.3 oz (88.9 kg)   SpO2 94%   BMI 37.05 kg/m   Patient alert and oriented and in no cardiopulmonary distress.  HEENT: No facial asymmetry, EOMI,     Neck supple .Tender  anterior cervical nodes, erythema of oropharynx, no exudate  Chest: Clear to auscultation bilaterally.  CVS: S1, S2 no murmurs, no S3.Regular rate.  ABD: Soft non tender.   Ext: No edema  MS: Adequate ROM spine, shoulders, hips and knees.  Skin: Intact, no ulcerations or rash noted.  Psych: Good eye contact, normal affect. Memory intact not anxious or depressed appearing.  CNS: CN 2-12 intact, power,  normal throughout.no focal deficits noted.   Assessment & Plan  Dysphagia 1 year h/o solid dysphagia and choking on food, refer GI and breath test, trial of protonix, h/o Gerd  Acute cervical adenitis Pen v x 10 days  Sore throat Rapid strep positiv , penicillin prescribed

## 2022-06-07 NOTE — Assessment & Plan Note (Signed)
Rapid strep positiv , penicillin prescribed

## 2022-06-08 ENCOUNTER — Encounter: Payer: Self-pay | Admitting: Gastroenterology

## 2022-06-09 ENCOUNTER — Encounter: Payer: Self-pay | Admitting: Orthopedic Surgery

## 2022-06-09 ENCOUNTER — Ambulatory Visit: Payer: Medicaid Other | Admitting: Orthopedic Surgery

## 2022-06-09 VITALS — Ht 61.0 in | Wt 199.0 lb

## 2022-06-09 DIAGNOSIS — G5603 Carpal tunnel syndrome, bilateral upper limbs: Secondary | ICD-10-CM

## 2022-06-09 NOTE — Progress Notes (Signed)
New Patient Visit  Assessment: Patricia Williamson is a 37 y.o. female with the following: 1. Carpal tunnel syndrome, bilateral  Plan: Patricia Williamson has chronic numbness, tingling and shooting pains in both hands.  Right is currently worse than left.  EMG demonstrates severe carpal tunnel syndrome bilaterally.  She is interested in surgery.  We discussed the procedure in detail.  All questions were answered.  She is interested in proceeding with right carpal tunnel release, and we will schedule surgery for June 22, 2022.  Risks and benefits of the surgery, including, but not limited to infection, bleeding, persistent pain, need for further surgery, recurrence of symptoms, incomplete resolution of symptoms and more severe complications associated with anesthesia were discussed with the patient.  The patient has elected to proceed.   Follow-up: Return for After surgery; DOS 06/22/22, f/u 2 weeks postop.  Subjective:  Chief Complaint  Patient presents with   Wrist Pain    Bil wrist pain R> L for years getting worse and now c/o dropping items as light as a 1/2 gallon of tea and scissors.     History of Present Illness: Patricia Williamson is a 37 y.o. female who presents for evaluation of bilateral hand pain.  She states she has had shooting pain, numbness, tingling and swelling in both hands for several years.  Symptoms in the right hand are worse than the left.  She notes that the whole hand goes numb.  She notes that she had swelling in the hands periodically.  She has tried braces, without resolution.  No history of trauma to either wrist.  She will occasionally drop items.  She has had EMG studies completed.  Review of Systems: No fevers or chills + numbness or tingling No chest pain No shortness of breath No bowel or bladder dysfunction No GI distress No headaches   Medical History:  Past Medical History:  Diagnosis Date   Asthma    Carpal tunnel syndrome    Gall stones    Hx  of constipation    Migraines    Nexplanon insertion 10/11/2013   Inserted left arm 10/11/13 remove 5/27 /18    Past Surgical History:  Procedure Laterality Date   CHOLECYSTECTOMY N/A 04/06/2014   Procedure: LAPAROSCOPIC CHOLECYSTECTOMY;  Surgeon: Jamesetta So, MD;  Location: [facility];  Service: General;  Laterality: N/A;   CYSTOSCOPY W/ URETERAL STENT PLACEMENT     EXCISION OF SKIN TAG Right 02/28/2019   Procedure: EXCISION OF GLUTEAL SKIN TAG;  Surgeon: Jonnie Kind, MD;  Location: [facility];  Service: Gynecology;  Laterality: Right;   LAPAROSCOPIC BILATERAL SALPINGECTOMY Bilateral 02/28/2019   Procedure: LAPAROSCOPIC BILATERAL SALPINGECTOMY;  Surgeon: Jonnie Kind, MD;  Location: [facility];  Service: Gynecology;  Laterality: Bilateral;   NORPLANT REMOVAL Left 02/28/2019   Procedure: REMOVAL OF NORPLANT;  Surgeon: Jonnie Kind, MD;  Location: [facility];  Service: Gynecology;  Laterality: Left;   TONSILECTOMY, ADENOIDECTOMY, BILATERAL MYRINGOTOMY AND TUBES     age 31   TUBAL LIGATION      Family History  Problem Relation Age of Onset   Diabetes Maternal Grandmother    Heart disease Maternal Grandmother    Hypertension Mother    Diabetes Mother    Hyperlipidemia Mother    Breast cancer Mother 26   Hypertension Brother    Other Paternal Aunt        benign breast lump   Social History   Tobacco Use   Smoking status:  Every Day    Packs/day: 0.50    Years: 11.00    Total pack years: 5.50    Types: Cigarettes   Smokeless tobacco: Never  Vaping Use   Vaping Use: Never used  Substance Use Topics   Alcohol use: No   Drug use: No    Allergies  Allergen Reactions   Vinegar [Acetic Acid] Hives and Itching   Sulfa Antibiotics Rash    Current Meds  Medication Sig   cromolyn (OPTICROM) 4 % ophthalmic solution SMARTSIG:In Eye(s)   ibuprofen (ADVIL) 600 MG tablet Take 1 tablet (600 mg total) by mouth every 6 (six) hours as needed for moderate pain or cramping.   megestrol  (MEGACE) 40 MG tablet Take 2 tablets (80 mg total) by mouth 2 (two) times daily.   ondansetron (ZOFRAN) 4 MG tablet Take 1 tablet (4 mg total) by mouth every 8 (eight) hours as needed for nausea or vomiting.   pantoprazole (PROTONIX) 20 MG tablet Take 1 tablet (20 mg total) by mouth daily.   SUMAtriptan (IMITREX) 25 MG tablet Take 1 tablet (25 mg total) by mouth every 2 (two) hours as needed for migraine. May repeat in 2 hours if headache persists or recurs (max 200 mg per 24-hour period)   Vitamin D, Ergocalciferol, (DRISDOL) 1.25 MG (50000 UNIT) CAPS capsule Take 1 capsule (50,000 Units total) by mouth every 7 (seven) days.   XIIDRA 5 % SOLN     Objective: Ht 5' 1" (1.549 m)   Wt 199 lb (90.3 kg)   BMI 37.60 kg/m   Physical Exam:  General: Alert and oriented. and No acute distress. Gait: Normal gait.  Bilateral hands without deformity.  No obvious atrophy within the thenar eminence.  Weak grip strength due to sensation issues.  Some numbness is appreciated in the median nerve distribution bilaterally.  Negative Tinel's bilaterally.  Positive Phalen's.  Positive Durkan's.  IMAGING: No new imaging obtained today  Bilateral upper extremity EMG demonstrates severe median nerve compression.  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    New Medications:  No orders of the defined types were placed in this encounter.     Aliyanah Rozas A Dameka Younker, MD  06/09/2022 9:09 AM   

## 2022-06-09 NOTE — H&P (View-Only) (Signed)
New Patient Visit  Assessment: Patricia Williamson is a 37 y.o. female with the following: 1. Carpal tunnel syndrome, bilateral  Plan: NEFTALY INZUNZA has chronic numbness, tingling and shooting pains in both hands.  Right is currently worse than left.  EMG demonstrates severe carpal tunnel syndrome bilaterally.  She is interested in surgery.  We discussed the procedure in detail.  All questions were answered.  She is interested in proceeding with right carpal tunnel release, and we will schedule surgery for June 22, 2022.  Risks and benefits of the surgery, including, but not limited to infection, bleeding, persistent pain, need for further surgery, recurrence of symptoms, incomplete resolution of symptoms and more severe complications associated with anesthesia were discussed with the patient.  The patient has elected to proceed.   Follow-up: Return for After surgery; DOS 06/22/22, f/u 2 weeks postop.  Subjective:  Chief Complaint  Patient presents with   Wrist Pain    Bil wrist pain R> L for years getting worse and now c/o dropping items as light as a 1/2 gallon of tea and scissors.     History of Present Illness: Patricia Williamson is a 37 y.o. female who presents for evaluation of bilateral hand pain.  She states she has had shooting pain, numbness, tingling and swelling in both hands for several years.  Symptoms in the right hand are worse than the left.  She notes that the whole hand goes numb.  She notes that she had swelling in the hands periodically.  She has tried braces, without resolution.  No history of trauma to either wrist.  She will occasionally drop items.  She has had EMG studies completed.  Review of Systems: No fevers or chills + numbness or tingling No chest pain No shortness of breath No bowel or bladder dysfunction No GI distress No headaches   Medical History:  Past Medical History:  Diagnosis Date   Asthma    Carpal tunnel syndrome    Gall stones    Hx  of constipation    Migraines    Nexplanon insertion 10/11/2013   Inserted left arm 10/11/13 remove 5/27 /18    Past Surgical History:  Procedure Laterality Date   CHOLECYSTECTOMY N/A 04/06/2014   Procedure: LAPAROSCOPIC CHOLECYSTECTOMY;  Surgeon: Jamesetta So, MD;  Location: [facility];  Service: General;  Laterality: N/A;   CYSTOSCOPY W/ URETERAL STENT PLACEMENT     EXCISION OF SKIN TAG Right 02/28/2019   Procedure: EXCISION OF GLUTEAL SKIN TAG;  Surgeon: Jonnie Kind, MD;  Location: [facility];  Service: Gynecology;  Laterality: Right;   LAPAROSCOPIC BILATERAL SALPINGECTOMY Bilateral 02/28/2019   Procedure: LAPAROSCOPIC BILATERAL SALPINGECTOMY;  Surgeon: Jonnie Kind, MD;  Location: [facility];  Service: Gynecology;  Laterality: Bilateral;   NORPLANT REMOVAL Left 02/28/2019   Procedure: REMOVAL OF NORPLANT;  Surgeon: Jonnie Kind, MD;  Location: [facility];  Service: Gynecology;  Laterality: Left;   TONSILECTOMY, ADENOIDECTOMY, BILATERAL MYRINGOTOMY AND TUBES     age 31   TUBAL LIGATION      Family History  Problem Relation Age of Onset   Diabetes Maternal Grandmother    Heart disease Maternal Grandmother    Hypertension Mother    Diabetes Mother    Hyperlipidemia Mother    Breast cancer Mother 26   Hypertension Brother    Other Paternal Aunt        benign breast lump   Social History   Tobacco Use   Smoking status:  Every Day    Packs/day: 0.50    Years: 11.00    Total pack years: 5.50    Types: Cigarettes   Smokeless tobacco: Never  Vaping Use   Vaping Use: Never used  Substance Use Topics   Alcohol use: No   Drug use: No    Allergies  Allergen Reactions   Vinegar [Acetic Acid] Hives and Itching   Sulfa Antibiotics Rash    Current Meds  Medication Sig   cromolyn (OPTICROM) 4 % ophthalmic solution SMARTSIG:In Eye(s)   ibuprofen (ADVIL) 600 MG tablet Take 1 tablet (600 mg total) by mouth every 6 (six) hours as needed for moderate pain or cramping.   megestrol  (MEGACE) 40 MG tablet Take 2 tablets (80 mg total) by mouth 2 (two) times daily.   ondansetron (ZOFRAN) 4 MG tablet Take 1 tablet (4 mg total) by mouth every 8 (eight) hours as needed for nausea or vomiting.   pantoprazole (PROTONIX) 20 MG tablet Take 1 tablet (20 mg total) by mouth daily.   SUMAtriptan (IMITREX) 25 MG tablet Take 1 tablet (25 mg total) by mouth every 2 (two) hours as needed for migraine. May repeat in 2 hours if headache persists or recurs (max 200 mg per 24-hour period)   Vitamin D, Ergocalciferol, (DRISDOL) 1.25 MG (50000 UNIT) CAPS capsule Take 1 capsule (50,000 Units total) by mouth every 7 (seven) days.   XIIDRA 5 % SOLN     Objective: Ht 5\' 1"  (1.549 m)   Wt 199 lb (90.3 kg)   BMI 37.60 kg/m   Physical Exam:  General: Alert and oriented. and No acute distress. Gait: Normal gait.  Bilateral hands without deformity.  No obvious atrophy within the thenar eminence.  Weak grip strength due to sensation issues.  Some numbness is appreciated in the median nerve distribution bilaterally.  Negative Tinel's bilaterally.  Positive Phalen's.  Positive Durkan's.  IMAGING: No new imaging obtained today  Bilateral upper extremity EMG demonstrates severe median nerve compression.  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    New Medications:  No orders of the defined types were placed in this encounter.     [name], MD  06/09/2022 9:09 AM

## 2022-06-09 NOTE — Patient Instructions (Addendum)
Plan for surgery 06/22/22

## 2022-06-11 ENCOUNTER — Other Ambulatory Visit: Payer: Self-pay

## 2022-06-11 DIAGNOSIS — G5603 Carpal tunnel syndrome, bilateral upper limbs: Secondary | ICD-10-CM

## 2022-06-17 NOTE — Patient Instructions (Signed)
Patricia Williamson  06/17/2022     @PREFPERIOPPHARMACY @   Your procedure is scheduled on  06/22/2022.   Report to [facility] at  0600  A.M.   Call this number if you have problems the morning of surgery:  [phone]  If you experience any cold or flu symptoms such as cough, fever, chills, shortness of breath, etc. between now and your scheduled surgery, please notify [phone] at the above number.   Remember:  Do not eat after midnight.   You may drink clear liquids until  0330 am on 06/22/2022.     Clear liquids allowed are:                    Water, Juice (No red color; non-citric and without pulp; diabetics please choose diet or no sugar options), Carbonated beverages (diabetics please choose diet or no sugar options), Clear Tea (No creamer, milk, or cream, including half & half and powdered creamer), Black Coffee Only (No creamer, milk or cream, including half & half and powdered creamer), Plain Jell-O Only (No red color; diabetics please choose no sugar options), Clear Sports drink (No red color; diabetics please choose diet or no sugar options), and Plain Popsicles Only (No red color; diabetics please choose no sugar options)    Take these medicines the morning of surgery with A SIP OF WATER              zofran (if needed), protonix, imitrex.     Do not wear jewelry, make-up or nail polish.  Do not wear lotions, powders, or perfumes, or deodorant.  Do not shave 48 hours prior to surgery.  Men may shave face and neck.  Do not bring valuables to the hospital.  [facility] is not responsible for any belongings or valuables.  Contacts, dentures or bridgework may not be worn into surgery.  Leave your suitcase in the car.  After surgery it may be brought to your room.  For patients admitted to the hospital, discharge time will be determined by your treatment team.  Patients discharged the day of surgery will not be allowed to drive home and must have someone with them for 24  hours.    Special instructions:   DO NOT smoke tobacco or vape for 24 hours before your procedure.  Please read over the following fact sheets that you were given. Coughing and Deep Breathing, Surgical Site Infection Prevention, Anesthesia Post-op Instructions, and Care and Recovery After Surgery      Open Carpal Tunnel Release, Care After This sheet gives you information about how to care for yourself after your procedure. Your health care provider may also give you more specific instructions. If you have problems or questions, contact your health care provider. What can I expect after the procedure? After the procedure, it is common to have: Pain. Swelling. Wrist stiffness. Bruising. Follow these instructions at home: Medicines Take over-the-counter and prescription medicines only as told by your health care provider. Ask your health care provider if the medicine prescribed to you: Requires you to avoid driving or using machinery. Can cause constipation. You may need to take these actions to prevent or treat constipation: Drink enough fluid to keep your urine pale yellow. Take over-the-counter or prescription medicines. Eat foods that are high in fiber, such as beans, whole grains, and fresh fruits and vegetables. Limit foods that are high in fat and processed sugars, such as fried or sweet  foods. Bathing Do not take baths, swim, or use a hot tub until your health care provider approves. Ask your health care provider if you may take showers. Keep your bandage (dressing) dry until your health care provider says it can be removed. Cover it with a watertight covering when you take a bath or a shower. If you have a splint or brace: Wear the splint or brace as told by your health care provider. You may need to wear it for 2-3 weeks. Remove it only as told by your health care provider. Loosen the splint or brace if your fingers tingle, become numb, or turn cold and blue. Keep the splint  or brace clean. If the splint or brace is not waterproof: Do not let it get wet. Cover it with a watertight covering when you take a bath or a shower. Incision care  After the compression bandage has been removed, follow instructions from your health care provider about how to take care of your incision. Make sure you: Wash your hands with soap and water for at least 20 seconds before and after you change your bandage (dressing). If soap and water are not available, use hand sanitizer. Change your dressing as told by your health care provider. Leave stitches (sutures), skin glue, or adhesive strips in place. These skin closures may need to stay in place for 2 weeks or longer. If adhesive strip edges start to loosen and curl up, you may trim the loose edges. Do not remove adhesive strips completely unless your health care provider tells you to do that. Check your incision area every day for signs of infection. Check for: Redness. More swelling or pain. Fluid or blood. Warmth. Pus or a bad smell. Managing pain, stiffness, and swelling  If directed, put ice on the affected area. If you have a removable splint or brace, remove it as told by your health care provider. Put ice in a plastic bag. Place a towel between your skin and the bag. Leave the ice on for 20 minutes, 2-3 times a day. Do not fall asleep with ice pack on your skin. Remove the ice if your skin turns bright red. This is very important. If you cannot feel pain, heat, or cold, you have a greater risk of damage to the area. Move your fingers often to avoid stiffness and to lessen swelling. Raise (elevate) your wrist above the level of your heart while you are sitting or lying down. Activity Do not drive until your health care provider approves. Use your hand carefully. Do not do activities that cause pain. You should be able to do light activities with your hand. Do not lift with your affected hand until your health care provider  approves. Avoid pulling and pushing with the injured arm. Return to your normal activities as told by your health care provider. Ask your health care provider what activities are safe for you. If physical therapy was prescribed, do exercises as told by your therapist. Physical therapy can help you heal faster and regain movement. General instructions Do not use any products that contain nicotine or tobacco, such as cigarettes and e-cigarettes. These can delay incision healing after surgery. If you need help quitting, ask your health care provider. Keep all follow-up visits. This is important. These include visits for physical therapy. Contact a health care provider if: You have redness around your incision. You have more swelling or pain. You have fluid or blood coming from your incision. Your incision feels warm to  the touch. You have pus or a bad smell coming from your incision. You have a fever or chills. You have pain that does not get better with medicine. Your carpal tunnel symptoms do not go away after 2 months. Your carpal tunnel symptoms go away and then come back. Get help right away if: You have pain or numbness that is getting worse. Your fingers or fingertips become very pale or bluish in color. You are not able to move your fingers. Summary It is common to have wrist stiffness and bruising after a carpal tunnel release. Icing and raising (elevating) your wrist may help to lessen swelling and pain. Call your health care provider if you have a fever or notice any signs of infection in your incision area. This information is not intended to replace advice given to you by your health care provider. Make sure you discuss any questions you have with your health care provider. Document Revised: 09/07/2019 Document Reviewed: 09/07/2019 Elsevier Patient Education  [place] After The following information offers guidance on how to care for  yourself after your procedure. Your health care provider may also give you more specific instructions. If you have problems or questions, contact your health care provider. What can I expect after the procedure? After the procedure, it is common to have: Tiredness. Little or no memory about what happened during or after the procedure. Impaired judgment when it comes to making decisions. Nausea or vomiting. Some trouble with balance. Follow these instructions at home: For the time period you were told by your health care provider:  Rest. Do not participate in activities where you could fall or become injured. Do not drive or use machinery. Do not drink alcohol. Do not take sleeping pills or medicines that cause drowsiness. Do not make important decisions or sign legal documents. Do not take care of children on your own. Medicines Take over-the-counter and prescription medicines only as told by your health care provider. If you were prescribed antibiotics, take them as told by your health care provider. Do not stop using the antibiotic even if you start to feel better. Eating and drinking Follow instructions from your health care provider about what you may eat and drink. Drink enough fluid to keep your urine pale yellow. If you vomit: Drink clear fluids slowly and in small amounts as you are able. Clear fluids include water, ice chips, low-calorie sports drinks, and fruit juice that has water added to it (diluted fruit juice). Eat light and bland foods in small amounts as you are able. These foods include bananas, applesauce, rice, lean meats, toast, and crackers. General instructions  Have a responsible adult stay with you for the time you are told. It is important to have someone help care for you until you are awake and alert. If you have sleep apnea, surgery and some medicines can increase your risk for breathing problems. Follow instructions from your health care provider about  wearing your sleep device: When you are sleeping. This includes during daytime naps. While taking prescription pain medicines, sleeping medicines, or medicines that make you drowsy. Do not use any products that contain nicotine or tobacco. These products include cigarettes, chewing tobacco, and vaping devices, such as e-cigarettes. If you need help quitting, ask your health care provider. Contact a health care provider if: You feel nauseous or vomit every time you eat or drink. You feel light-headed. You are still sleepy or having trouble with balance after 24 hours.  You get a rash. You have a fever. You have redness or swelling around the IV site. Get help right away if: You have trouble breathing. You have new confusion after you get home. These symptoms may be an emergency. Get help right away. Call 911. Do not wait to see if the symptoms will go away. Do not drive yourself to the hospital. This information is not intended to replace advice given to you by your health care provider. Make sure you discuss any questions you have with your health care provider. Document Revised: 09/29/2021 Document Reviewed: 09/29/2021 Elsevier Patient Education  2023 [employer] How to Use Chlorhexidine Before Surgery Chlorhexidine gluconate (CHG) is a germ-killing (antiseptic) solution that is used to clean the skin. It can get rid of the bacteria that normally live on the skin and can keep them away for about 24 hours. To clean your skin with CHG, you may be given: A CHG solution to use in the shower or as part of a sponge bath. A prepackaged cloth that contains CHG. Cleaning your skin with CHG may help lower the risk for infection: While you are staying in the intensive care unit of the hospital. If you have a vascular access, such as a central line, to provide short-term or long-term access to your veins. If you have a catheter to drain urine from your bladder. If you are on a ventilator. A  ventilator is a machine that helps you breathe by moving air in and out of your lungs. After surgery. What are the risks? Risks of using CHG include: A skin reaction. Hearing loss, if CHG gets in your ears and you have a perforated eardrum. Eye injury, if CHG gets in your eyes and is not rinsed out. The CHG product catching fire. Make sure that you avoid smoking and flames after applying CHG to your skin. Do not use CHG: If you have a chlorhexidine allergy or have previously reacted to chlorhexidine. On babies younger than 27 months of age. How to use CHG solution Use CHG only as told by your health care provider, and follow the instructions on the label. Use the full amount of CHG as directed. Usually, this is one bottle. During a shower Follow these steps when using CHG solution during a shower (unless your health care provider gives you different instructions): Start the shower. Use your normal soap and shampoo to wash your face and hair. Turn off the shower or move out of the shower stream. Pour the CHG onto a clean washcloth. Do not use any type of brush or rough-edged sponge. Starting at your neck, lather your body down to your toes. Make sure you follow these instructions: If you will be having surgery, pay special attention to the part of your body where you will be having surgery. Scrub this area for at least 1 minute. Do not use CHG on your head or face. If the solution gets into your ears or eyes, rinse them well with water. Avoid your genital area. Avoid any areas of skin that have broken skin, cuts, or scrapes. Scrub your back and under your arms. Make sure to wash skin folds. Let the lather sit on your skin for 1-2 minutes or as long as told by your health care provider. Thoroughly rinse your entire body in the shower. Make sure that all body creases and crevices are rinsed well. Dry off with a clean towel. Do not put any substances on your body afterward--such as powder,  lotion,  or perfume--unless you are told to do so by your health care provider. Only use lotions that are recommended by the manufacturer. Put on clean clothes or pajamas. If it is the night before your surgery, sleep in clean sheets.  During a sponge bath Follow these steps when using CHG solution during a sponge bath (unless your health care provider gives you different instructions): Use your normal soap and shampoo to wash your face and hair. Pour the CHG onto a clean washcloth. Starting at your neck, lather your body down to your toes. Make sure you follow these instructions: If you will be having surgery, pay special attention to the part of your body where you will be having surgery. Scrub this area for at least 1 minute. Do not use CHG on your head or face. If the solution gets into your ears or eyes, rinse them well with water. Avoid your genital area. Avoid any areas of skin that have broken skin, cuts, or scrapes. Scrub your back and under your arms. Make sure to wash skin folds. Let the lather sit on your skin for 1-2 minutes or as long as told by your health care provider. Using a different clean, wet washcloth, thoroughly rinse your entire body. Make sure that all body creases and crevices are rinsed well. Dry off with a clean towel. Do not put any substances on your body afterward--such as powder, lotion, or perfume--unless you are told to do so by your health care provider. Only use lotions that are recommended by the manufacturer. Put on clean clothes or pajamas. If it is the night before your surgery, sleep in clean sheets. How to use CHG prepackaged cloths Only use CHG cloths as told by your health care provider, and follow the instructions on the label. Use the CHG cloth on clean, dry skin. Do not use the CHG cloth on your head or face unless your health care provider tells you to. When washing with the CHG cloth: Avoid your genital area. Avoid any areas of skin that have  broken skin, cuts, or scrapes. Before surgery Follow these steps when using a CHG cloth to clean before surgery (unless your health care provider gives you different instructions): Using the CHG cloth, vigorously scrub the part of your body where you will be having surgery. Scrub using a back-and-forth motion for 3 minutes. The area on your body should be completely wet with CHG when you are done scrubbing. Do not rinse. Discard the cloth and let the area air-dry. Do not put any substances on the area afterward, such as powder, lotion, or perfume. Put on clean clothes or pajamas. If it is the night before your surgery, sleep in clean sheets.  For general bathing Follow these steps when using CHG cloths for general bathing (unless your health care provider gives you different instructions). Use a separate CHG cloth for each area of your body. Make sure you wash between any folds of skin and between your fingers and toes. Wash your body in the following order, switching to a new cloth after each step: The front of your neck, shoulders, and chest. Both of your arms, under your arms, and your hands. Your stomach and groin area, avoiding the genitals. Your right leg and foot. Your left leg and foot. The back of your neck, your back, and your buttocks. Do not rinse. Discard the cloth and let the area air-dry. Do not put any substances on your body afterward--such as powder, lotion, or perfume--unless you  are told to do so by your health care provider. Only use lotions that are recommended by the manufacturer. Put on clean clothes or pajamas. Contact a health care provider if: Your skin gets irritated after scrubbing. You have questions about using your solution or cloth. You swallow any chlorhexidine. Call your local poison control center (1-(909)271-6038 in the [place] Get help right away if: Your eyes itch badly, or they become very red or swollen. Your skin itches badly and is red or  swollen. Your hearing changes. You have trouble seeing. You have swelling or tingling in your mouth or throat. You have trouble breathing. These symptoms may represent a serious problem that is an emergency. Do not wait to see if the symptoms will go away. Get medical help right away. Call your local emergency services (911 in the [place] Do not drive yourself to the hospital. Summary Chlorhexidine gluconate (CHG) is a germ-killing (antiseptic) solution that is used to clean the skin. Cleaning your skin with CHG may help to lower your risk for infection. You may be given CHG to use for bathing. It may be in a bottle or in a prepackaged cloth to use on your skin. Carefully follow your health care provider's instructions and the instructions on the product label. Do not use CHG if you have a chlorhexidine allergy. Contact your health care provider if your skin gets irritated after scrubbing. This information is not intended to replace advice given to you by your health care provider. Make sure you discuss any questions you have with your health care provider. Document Revised: 09/01/2021 Document Reviewed: 07/15/2020 Elsevier Patient Education  [place].

## 2022-06-18 ENCOUNTER — Encounter (HOSPITAL_COMMUNITY)
Admission: RE | Admit: 2022-06-18 | Discharge: 2022-06-18 | Disposition: A | Discharge status: Home / Self Care | Payer: Medicaid Other | Source: Ambulatory Visit | Attending: Orthopedic Surgery | Admitting: Orthopedic Surgery

## 2022-06-18 ENCOUNTER — Encounter (HOSPITAL_COMMUNITY): Payer: Self-pay

## 2022-06-18 VITALS — BP 121/68 | HR 72 | Temp 97.8°F | Resp 18 | Ht 61.0 in | Wt 199.0 lb

## 2022-06-18 DIAGNOSIS — Z01812 Encounter for preprocedural laboratory examination: Secondary | ICD-10-CM | POA: Diagnosis not present

## 2022-06-18 DIAGNOSIS — Z01818 Encounter for other preprocedural examination: Secondary | ICD-10-CM

## 2022-06-18 LAB — POCT PREGNANCY, URINE: Preg Test, Ur: NEGATIVE

## 2022-06-22 ENCOUNTER — Encounter (HOSPITAL_COMMUNITY)
Admission: RE | Disposition: A | Discharge status: Home / Self Care | Payer: Self-pay | Source: Ambulatory Visit | Attending: Orthopedic Surgery

## 2022-06-22 ENCOUNTER — Ambulatory Visit (HOSPITAL_COMMUNITY): Payer: Medicaid Other | Admitting: Certified Registered Nurse Anesthetist

## 2022-06-22 ENCOUNTER — Encounter (HOSPITAL_COMMUNITY): Payer: Self-pay | Admitting: Orthopedic Surgery

## 2022-06-22 ENCOUNTER — Other Ambulatory Visit: Payer: Self-pay

## 2022-06-22 ENCOUNTER — Ambulatory Visit (HOSPITAL_COMMUNITY)
Admission: RE | Admit: 2022-06-22 | Discharge: 2022-06-22 | Disposition: A | Discharge status: Home / Self Care | Payer: Medicaid Other | Source: Ambulatory Visit | Attending: Orthopedic Surgery | Admitting: Orthopedic Surgery

## 2022-06-22 ENCOUNTER — Ambulatory Visit (HOSPITAL_BASED_OUTPATIENT_CLINIC_OR_DEPARTMENT_OTHER): Payer: Medicaid Other | Admitting: Certified Registered Nurse Anesthetist

## 2022-06-22 DIAGNOSIS — J45909 Unspecified asthma, uncomplicated: Secondary | ICD-10-CM | POA: Insufficient documentation

## 2022-06-22 DIAGNOSIS — G5601 Carpal tunnel syndrome, right upper limb: Secondary | ICD-10-CM | POA: Diagnosis not present

## 2022-06-22 DIAGNOSIS — G5603 Carpal tunnel syndrome, bilateral upper limbs: Secondary | ICD-10-CM | POA: Diagnosis present

## 2022-06-22 DIAGNOSIS — F17210 Nicotine dependence, cigarettes, uncomplicated: Secondary | ICD-10-CM | POA: Diagnosis not present

## 2022-06-22 DIAGNOSIS — G43909 Migraine, unspecified, not intractable, without status migrainosus: Secondary | ICD-10-CM | POA: Diagnosis not present

## 2022-06-22 DIAGNOSIS — Z6837 Body mass index (BMI) 37.0-37.9, adult: Secondary | ICD-10-CM

## 2022-06-22 HISTORY — PX: CARPAL TUNNEL RELEASE: SHX101

## 2022-06-22 SURGERY — CARPAL TUNNEL RELEASE
Anesthesia: General | Site: Hand | Laterality: Right

## 2022-06-22 MED ORDER — HYDROCODONE-ACETAMINOPHEN 5-325 MG PO TABS: 1.0000 | ORAL_TABLET | Freq: Four times a day (QID) | ORAL | 0 refills | Status: AC | PRN

## 2022-06-22 MED ORDER — CHLORHEXIDINE GLUCONATE 0.12 % MT SOLN
15.0000 mL | Freq: Once | OROMUCOSAL | Status: AC
  Administered 2022-06-22: 15 mL via OROMUCOSAL

## 2022-06-22 MED ORDER — MIDAZOLAM HCL 5 MG/5ML IJ SOLN
INTRAMUSCULAR | Status: DC | PRN
  Administered 2022-06-22: 2 mg via INTRAVENOUS

## 2022-06-22 MED ORDER — ONDANSETRON HCL 4 MG/2ML IJ SOLN
INTRAMUSCULAR | Status: AC
  Filled 2022-06-22: qty 2

## 2022-06-22 MED ORDER — BUPIVACAINE HCL (PF) 0.5 % IJ SOLN
INTRAMUSCULAR | Status: AC
  Filled 2022-06-22: qty 30

## 2022-06-22 MED ORDER — DEXAMETHASONE SODIUM PHOSPHATE 10 MG/ML IJ SOLN
INTRAMUSCULAR | Status: AC
  Filled 2022-06-22: qty 1

## 2022-06-22 MED ORDER — ORAL CARE MOUTH RINSE: 15.0000 mL | Freq: Once | OROMUCOSAL | Status: AC

## 2022-06-22 MED ORDER — PROPOFOL 500 MG/50ML IV EMUL
INTRAVENOUS | Status: DC | PRN
  Administered 2022-06-22: 75 ug/kg/min via INTRAVENOUS

## 2022-06-22 MED ORDER — BUPIVACAINE HCL (PF) 0.5 % IJ SOLN
INTRAMUSCULAR | Status: DC | PRN
  Administered 2022-06-22: 15 mL

## 2022-06-22 MED ORDER — MIDAZOLAM HCL 2 MG/2ML IJ SOLN
INTRAMUSCULAR | Status: AC
  Filled 2022-06-22: qty 2

## 2022-06-22 MED ORDER — FENTANYL CITRATE (PF) 100 MCG/2ML IJ SOLN
INTRAMUSCULAR | Status: DC | PRN
  Administered 2022-06-22 (×2): 50 ug via INTRAVENOUS

## 2022-06-22 MED ORDER — DEXMEDETOMIDINE HCL IN NACL 80 MCG/20ML IV SOLN
INTRAVENOUS | Status: AC
  Filled 2022-06-22: qty 20

## 2022-06-22 MED ORDER — FENTANYL CITRATE (PF) 100 MCG/2ML IJ SOLN
INTRAMUSCULAR | Status: AC
  Filled 2022-06-22: qty 2

## 2022-06-22 MED ORDER — LIDOCAINE HCL (PF) 2 % IJ SOLN
INTRAMUSCULAR | Status: AC
  Filled 2022-06-22: qty 5

## 2022-06-22 MED ORDER — LIDOCAINE 2% (20 MG/ML) 5 ML SYRINGE
INTRAMUSCULAR | Status: DC | PRN
  Administered 2022-06-22: 50 mg via INTRAVENOUS

## 2022-06-22 MED ORDER — 0.9 % SODIUM CHLORIDE (POUR BTL) OPTIME
TOPICAL | Status: DC | PRN
  Administered 2022-06-22: 1000 mL

## 2022-06-22 MED ORDER — DEXMEDETOMIDINE HCL IN NACL 80 MCG/20ML IV SOLN
INTRAVENOUS | Status: DC | PRN
  Administered 2022-06-22 (×2): 8 ug via BUCCAL

## 2022-06-22 MED ORDER — PROPOFOL 10 MG/ML IV BOLUS
INTRAVENOUS | Status: DC | PRN
  Administered 2022-06-22: 40 mg via INTRAVENOUS
  Administered 2022-06-22: 50 mg via INTRAVENOUS

## 2022-06-22 MED ORDER — PROPOFOL 500 MG/50ML IV EMUL
INTRAVENOUS | Status: AC
  Filled 2022-06-22: qty 50

## 2022-06-22 MED ORDER — PROPOFOL 10 MG/ML IV BOLUS
INTRAVENOUS | Status: AC
  Filled 2022-06-22: qty 20

## 2022-06-22 MED ORDER — CEFAZOLIN SODIUM-DEXTROSE 2-4 GM/100ML-% IV SOLN
2.0000 g | INTRAVENOUS | Status: AC
  Administered 2022-06-22: 2 g via INTRAVENOUS
  Filled 2022-06-22: qty 100

## 2022-06-22 MED ORDER — ONDANSETRON HCL 4 MG PO TABS: 4.0000 mg | ORAL_TABLET | Freq: Three times a day (TID) | ORAL | 0 refills | Status: AC | PRN

## 2022-06-22 MED ORDER — DEXAMETHASONE SODIUM PHOSPHATE 10 MG/ML IJ SOLN
INTRAMUSCULAR | Status: DC | PRN
  Administered 2022-06-22: 5 mg via INTRAVENOUS

## 2022-06-22 MED ORDER — LACTATED RINGERS IV SOLN: INTRAVENOUS | Status: DC

## 2022-06-22 MED ORDER — ONDANSETRON HCL 4 MG/2ML IJ SOLN
INTRAMUSCULAR | Status: DC | PRN
  Administered 2022-06-22: 4 mg via INTRAVENOUS

## 2022-06-22 SURGICAL SUPPLY — 39 items
APL PRP STRL LF DISP 70% ISPRP (MISCELLANEOUS) ×1
BANDAGE ESMARK 4X12 BL STRL LF (DISPOSABLE) ×2 IMPLANT
BLADE SURG 15 STRL LF DISP TIS (BLADE) ×2 IMPLANT
BLADE SURG 15 STRL SS (BLADE) ×1
BNDG CMPR 12X4 ELC STRL LF (DISPOSABLE) ×1
BNDG CMPR 5X2 CHSV 1 LYR STRL (GAUZE/BANDAGES/DRESSINGS) ×1
BNDG COHESIVE 2X5 TAN ST LF (GAUZE/BANDAGES/DRESSINGS) ×2 IMPLANT
BNDG ELASTIC 3X5.8 VLCR STR LF (GAUZE/BANDAGES/DRESSINGS) IMPLANT
BNDG ESMARK 4X12 BLUE STRL LF (DISPOSABLE) ×1
BNDG GAUZE DERMACEA FLUFF 4 (GAUZE/BANDAGES/DRESSINGS) IMPLANT
BNDG GZE DERMACEA 4 6PLY (GAUZE/BANDAGES/DRESSINGS) ×1
CHLORAPREP W/TINT 26 (MISCELLANEOUS) ×2 IMPLANT
CLOTH BEACON ORANGE TIMEOUT ST (SAFETY) ×2 IMPLANT
CORD BIPOLAR FORCEPS 12FT (ELECTRODE) ×2 IMPLANT
COVER LIGHT HANDLE STERIS (MISCELLANEOUS) ×4 IMPLANT
CUFF TOURN SGL QUICK 18X4 (TOURNIQUET CUFF) ×2 IMPLANT
DRAPE HALF SHEET 40X57 (DRAPES) ×2 IMPLANT
GAUZE SPONGE 4X4 12PLY STRL (GAUZE/BANDAGES/DRESSINGS) ×2 IMPLANT
GAUZE XEROFORM 1X8 LF (GAUZE/BANDAGES/DRESSINGS) ×2 IMPLANT
GLOVE BIO SURGEON STRL SZ7 (GLOVE) IMPLANT
GLOVE BIO SURGEON STRL SZ8 (GLOVE) ×6 IMPLANT
GLOVE BIOGEL PI IND STRL 7.0 (GLOVE) ×4 IMPLANT
GLOVE SRG 8 PF TXTR STRL LF DI (GLOVE) ×2 IMPLANT
GLOVE SURG UNDER POLY LF SZ8 (GLOVE) ×1
GOWN STRL REUS W/ TWL XL LVL3 (GOWN DISPOSABLE) ×2 IMPLANT
GOWN STRL REUS W/TWL LRG LVL3 (GOWN DISPOSABLE) ×2 IMPLANT
GOWN STRL REUS W/TWL XL LVL3 (GOWN DISPOSABLE) ×1
KIT TURNOVER KIT A (KITS) ×2 IMPLANT
MANIFOLD NEPTUNE II (INSTRUMENTS) ×2 IMPLANT
NDL HYPO 21X1.5 SAFETY (NEEDLE) ×2 IMPLANT
NEEDLE HYPO 21X1.5 SAFETY (NEEDLE) ×1 IMPLANT
NS IRRIG 1000ML POUR BTL (IV SOLUTION) ×2 IMPLANT
PACK BASIC LIMB (CUSTOM PROCEDURE TRAY) ×2 IMPLANT
PAD ARMBOARD 7.5X6 YLW CONV (MISCELLANEOUS) ×2 IMPLANT
POSITIONER HAND ALUMI XLG (MISCELLANEOUS) ×2 IMPLANT
SET BASIN LINEN APH (SET/KITS/TRAYS/PACK) ×2 IMPLANT
SUT PROLENE NAB BLUE 3-0 30IN (SUTURE) IMPLANT
SYR CONTROL 10ML LL (SYRINGE) ×2 IMPLANT
UNDERPAD 30X36 HEAVY ABSORB (UNDERPADS AND DIAPERS) ×2 IMPLANT

## 2022-06-22 NOTE — Transfer of Care (Signed)
Immediate Anesthesia Transfer of Care Note  Patient: Patricia Williamson  Procedure(s) Performed: CARPAL TUNNEL RELEASE (Right: Hand)  Patient Location: PACU  Anesthesia Type:General  Level of Consciousness: drowsy  Airway & Oxygen Therapy: Patient Spontanous Breathing and Patient connected to face mask oxygen  Post-op Assessment: Report given to RN and Post -op Vital signs reviewed and stable  Post vital signs: Reviewed and stable  Last Vitals:  Vitals Value Taken Time  BP    Temp    Pulse    Resp    SpO2      Last Pain:  Vitals:   06/22/22 0709  TempSrc: Oral  PainSc: 0-No pain      Patients Stated Pain Goal: 5 (09/62/83 6629)  Complications: No notable events documented.

## 2022-06-22 NOTE — Discharge Instructions (Signed)
  Maciel Kegg A. Amedeo Kinsman, MD [facility] [address] [place],  [place]  [address] Phone: [phone] Fax: [phone]    [place] may remove your bandage on postop day 3 and get the hand wet.  Keep incision covered with a band aid until your follow up appointment.  Ok to remove band-aid for hygiene.  No ointments or lotions to be applied to the incision.  Do not submerge the incision for 1 month.  FOLLOW-UP If you develop a Fever (>101.5), Redness or Drainage from the surgical incision site, please call our office to arrange for an evaluation. Please call the office to schedule a follow-up appointment for your incision check if you do not already have one, 10-14 days post-operatively.  IF YOU HAVE ANY QUESTIONS, PLEASE FEEL FREE TO CALL OUR OFFICE.  HELPFUL INFORMATION  You should wean off your narcotic medicines as soon as you are able.  Most patients will be off or using minimal narcotics before their first postop appointment.   You may be more comfortable sleeping in a semi-seated position the first few nights following surgery.  Keep a pillow propped under the elbow and forearm for comfort.  If you have a recliner type of chair it might be beneficial.    We suggest you use the pain medication the first night prior to going to bed, in order to ease any pain when the anesthesia wears off. You should avoid taking pain medications on an empty stomach as it will make you nauseous.  Do not drink alcoholic beverages or take illicit drugs when taking pain medications.  You may return to work/school in the next couple of days when you feel up to it. Desk work and typing is fine.  Pain medication may make you constipated.  Below are a few solutions to try in this order: Decrease the amount of pain medication if you aren't having pain. Drink lots of decaffeinated fluids. Drink prune juice and/or each dried prunes  If the  first 3 don't work start with additional solutions Take Colace - an over-the-counter stool softener Take Senokot - an over-the-counter laxative Take Miralax - a stronger over-the-counter laxative

## 2022-06-22 NOTE — Interval H&P Note (Signed)
History and Physical Interval Note:  06/22/2022 7:18 AM  Patricia Williamson  has presented today for surgery, with the diagnosis of Right carpal tunnel syndrome.  The various methods of treatment have been discussed with the patient and family. After consideration of risks, benefits and other options for treatment, the patient has consented to  Procedure(s): CARPAL TUNNEL RELEASE (Right) as a surgical intervention.  The patient's history has been reviewed, patient examined, no change in status, stable for surgery.  I have reviewed the patient's chart and labs.  Questions were answered to the patient's satisfaction.     [name]

## 2022-06-22 NOTE — Anesthesia Preprocedure Evaluation (Signed)
Anesthesia Evaluation  Patient identified by MRN, date of birth, ID band Patient awake    Reviewed: Allergy & Precautions, H&P , NPO status , Patient's Chart, lab work & pertinent test results, reviewed documented beta blocker date and time   Airway Mallampati: II  TM Distance: >3 FB Neck ROM: full    Dental no notable dental hx.    Pulmonary neg pulmonary ROS, asthma , Current Smoker   Pulmonary exam normal breath sounds clear to auscultation       Cardiovascular Exercise Tolerance: Good negative cardio ROS  Rhythm:regular Rate:Normal     Neuro/Psych  Headaches  Neuromuscular disease negative neurological ROS  negative psych ROS   GI/Hepatic negative GI ROS, Neg liver ROS,,,  Endo/Other    Morbid obesity  Renal/GU negative Renal ROS  negative genitourinary   Musculoskeletal   Abdominal   Peds  Hematology negative hematology ROS (+)   Anesthesia Other Findings   Reproductive/Obstetrics negative OB ROS                             Anesthesia Physical Anesthesia Plan  ASA: 3  Anesthesia Plan: General and General LMA   Post-op Pain Management:    Induction:   PONV Risk Score and Plan: Propofol infusion  Airway Management Planned:   Additional Equipment:   Intra-op Plan:   Post-operative Plan:   Informed Consent: I have reviewed the patients History and Physical, chart, labs and discussed the procedure including the risks, benefits and alternatives for the proposed anesthesia with the patient or authorized representative who has indicated his/her understanding and acceptance.     Dental Advisory Given  Plan Discussed with: CRNA  Anesthesia Plan Comments: (Propofol infusion vs. LMA)       Anesthesia Quick Evaluation

## 2022-06-22 NOTE — Op Note (Signed)
Orthopaedic Surgery Operative Note (CSN: [id number])  Patricia Williamson  [birth year] Date of Surgery: 06/22/2022   Diagnoses:  Right carpal tunnel syndrome  Procedure: Right Open Carpal Tunnel Release   Operative Finding Successful completion of the planned procedure.  Complete release of the transverse carpal ligament.    Post-Op Diagnosis: Same Surgeons:Primary: [name], MD Assistants: None Location: [facility] OR ROOM 4 Anesthesia: Sedation plus regional anesthesia Antibiotics: Ancef 2 g Tourniquet time:  Total Tourniquet Time Documented: Upper Arm (Right) - 26 minutes Total: Upper Arm (Right) - 26 minutes  Estimated Blood Loss: Minimal Complications: None Specimens: None Implants:  None  Indications for Surgery:   Patricia Williamson is a 37 y.o. female with symptoms consistent with carpal tunnel syndrome.  Symptoms have been ongoing, and progressively worsening.  They have tried medications and bracing without improvement in symptoms.  EMG results demonstrate severe right carpal tunnel syndrome.  Risks and benefits of operative and nonoperative management were discussed prior to surgery with the patient and informed consent form was completed.  Specific risks including infection, need for additional surgery, bleeding, recurrent symptoms, incomplete resolution of symptoms, persistent pain and more severe complications associated with anesthesia were discussed.  All questions were answered.  Surgical consent was finalized.    Procedure:   The patient was identified properly. Informed consent was finalized and the surgical site was marked. The patient was taken to the OR where regional anesthesia was induced.  The patient was positioned supine, on a hand table.  The right arm was prepped and draped in the usual sterile fashion.  Timeout was performed before the beginning of the case.  Tourniquet was used for the above duration.  Antibiotics were administered prior to making  incision.  Incision was made in line with the radial border of the ring finger. The carpal tunnel transverse fascia was identified, cleaned, and incised sharply. The common sensory branches were visualized along with the superficial palmar arch and protected.  The median nerve was protected below. Deep retractors were placed underneath the transverse carpal ligament, protecting the nerve. I released the ligament completely, and then released the distal volar forearm fascia. The nerve was identified, and visualized, and protected throughout the case. No masses or abnormalities were identified in ulnar bursa.  The wounds were irrigated copiously, and the wounds injected. Skin closed with interrupted sutures followed by a bulky dressing. Patient  tolerated this well, with no complications.   Post-operative plan:  The patient will be discharged home from the PACU. WBAT on the operative extremity; limit lifting to nothing more than a coffee cup until follow up appointment   DVT prophylaxis not indicated in this ambulatory upper extremity patient without significant risk factors.    Pain control with PRN pain medication preferring oral medicines.   Follow up plan will be scheduled in approximately 7-10 days for incision check

## 2022-06-23 NOTE — Anesthesia Postprocedure Evaluation (Signed)
Anesthesia Post Note  Patient: Patricia Williamson  Procedure(s) Performed: CARPAL TUNNEL RELEASE (Right: Hand)  Patient location during evaluation: Phase II Anesthesia Type: General Level of consciousness: awake Pain management: pain level controlled Vital Signs Assessment: post-procedure vital signs reviewed and stable Respiratory status: spontaneous breathing and respiratory function stable Cardiovascular status: blood pressure returned to baseline and stable Postop Assessment: no headache and no apparent nausea or vomiting Anesthetic complications: no Comments: Late entry   No notable events documented.   Last Vitals:  Vitals:   06/22/22 0845 06/22/22 0900  BP: 113/63 125/69  Pulse: (!) 55 70  Resp: 18 (!) 27  Temp:    SpO2: 100% 96%    Last Pain:  Vitals:   06/22/22 0845  TempSrc:   PainSc: 0-No pain                 Louann Sjogren

## 2022-06-26 ENCOUNTER — Encounter (HOSPITAL_COMMUNITY): Payer: Self-pay | Admitting: Orthopedic Surgery

## 2022-06-29 ENCOUNTER — Encounter: Payer: Self-pay | Admitting: Neurology

## 2022-06-29 ENCOUNTER — Ambulatory Visit: Payer: Medicaid Other | Admitting: Neurology

## 2022-07-07 ENCOUNTER — Ambulatory Visit: Payer: Medicaid Other | Admitting: Gastroenterology

## 2022-07-07 ENCOUNTER — Ambulatory Visit (INDEPENDENT_AMBULATORY_CARE_PROVIDER_SITE_OTHER): Payer: Medicaid Other | Admitting: Orthopedic Surgery

## 2022-07-07 ENCOUNTER — Encounter: Payer: Self-pay | Admitting: Orthopedic Surgery

## 2022-07-07 DIAGNOSIS — G5601 Carpal tunnel syndrome, right upper limb: Secondary | ICD-10-CM

## 2022-07-07 NOTE — Progress Notes (Signed)
Orthopaedic Postop Note  Assessment: Patricia Williamson is a 37 y.o. female s/p right open carpal tunnel release  DOS:  Plan: Patricia Williamson has done well.  Some residual numbness to the tip of the index, long and ring finger Denies burning or shooting pains.  Stiffness and swelling to fingers is expected.  Encouraged to work on range of motion.  OT if limited improvement.  Surgical incision is healing well.  Sutures were removed, and Steri-Strips were placed.  Okay to return to work.  Keep incision covered.  Do not submerge the wound.  Return in 4 weeks.    Follow-up: Return in about 4 weeks (around 08/04/2022). XR at next visit: None  Subjective:  Chief Complaint  Patient presents with   Post-op Follow-up    Right carpal tunnel release 06/22/22 states painful/ sutures removed without difficulty     History of Present Illness: Patricia Williamson is a 37 y.o. female who presents following the above stated procedure.  Surgery was approximately 2 weeks ago.  No issues.  Some pain for the first few days.  Burning and shooting pains have almost completely resolved.  Some numbness in the fingers.   No issues with the incision.  She notes stiffness and swelling in her fingers, as well as her wrist.  Review of Systems: No fevers or chills Some numbness or tingling No Chest Pain No shortness of breath   Objective: There were no vitals taken for this visit.  Physical Exam:  Alert and oriented, no acute distress.  Surgical incision is healing well.  No surrounding erythema or drainage.  Mild tenderness to palpation about surgical site.  Sensation is intact in the median nerve distribution.  Able to make a full fist. No redness. 2+ radial pulse.   IMAGING: I personally ordered and reviewed the following images:  No new imaging obtained today  [name], MD 07/07/2022 2:19 PM

## 2022-07-16 ENCOUNTER — Encounter: Payer: Self-pay | Admitting: Radiology

## 2022-07-17 ENCOUNTER — Other Ambulatory Visit: Payer: Self-pay

## 2022-07-17 DIAGNOSIS — G43E01 Chronic migraine with aura, not intractable, with status migrainosus: Secondary | ICD-10-CM

## 2022-07-17 MED ORDER — SUMATRIPTAN SUCCINATE 25 MG PO TABS: 25.0000 mg | ORAL_TABLET | ORAL | 0 refills | Status: DC | PRN

## 2022-07-23 ENCOUNTER — Encounter: Payer: Self-pay | Admitting: Gastroenterology

## 2022-07-23 ENCOUNTER — Ambulatory Visit (INDEPENDENT_AMBULATORY_CARE_PROVIDER_SITE_OTHER): Payer: Medicaid Other | Admitting: Gastroenterology

## 2022-07-23 ENCOUNTER — Telehealth (INDEPENDENT_AMBULATORY_CARE_PROVIDER_SITE_OTHER): Payer: Self-pay | Admitting: Internal Medicine

## 2022-07-23 VITALS — BP 137/87 | HR 80 | Temp 98.2°F | Ht 61.0 in | Wt 194.8 lb

## 2022-07-23 DIAGNOSIS — K5900 Constipation, unspecified: Secondary | ICD-10-CM | POA: Diagnosis not present

## 2022-07-23 DIAGNOSIS — R1310 Dysphagia, unspecified: Secondary | ICD-10-CM | POA: Diagnosis not present

## 2022-07-23 DIAGNOSIS — K219 Gastro-esophageal reflux disease without esophagitis: Secondary | ICD-10-CM

## 2022-07-23 DIAGNOSIS — K625 Hemorrhage of anus and rectum: Secondary | ICD-10-CM | POA: Diagnosis not present

## 2022-07-23 MED ORDER — PANTOPRAZOLE SODIUM 40 MG PO TBEC: 40.0000 mg | DELAYED_RELEASE_TABLET | Freq: Two times a day (BID) | ORAL | 3 refills | Status: AC

## 2022-07-23 MED ORDER — HYDROCORTISONE (PERIANAL) 2.5 % EX CREA: 1.0000 | TOPICAL_CREAM | Freq: Two times a day (BID) | CUTANEOUS | 1 refills | Status: DC

## 2022-07-23 NOTE — Patient Instructions (Signed)
We are arranging a colonoscopy, upper endoscopy, and dilation by Dr. Abbey Chatters in the near future!  I have sent in pantoprazole (Protonix) to take twice a day, 30 minutes before breakfast and dinner for reflux.   For constipation: start Linzess 290 mcg once daily. Linzess works best when taken once a day every day, on an empty stomach, at least 30 minutes before your first meal of the day.  When Linzess is taken daily as directed:  *Constipation relief is typically felt in about a week *IBS-C patients may begin to experience relief from belly pain and overall abdominal symptoms (pain, discomfort, and bloating) in about 1 week,   with symptoms typically improving over 12 weeks.  Diarrhea may occur in the first 2 weeks -keep taking it.  The diarrhea should go away and you should start having normal, complete, full bowel movements. It may be helpful to start treatment when you can be near the comfort of your own bathroom, such as a weekend.    Please let me know how you do with this so I can send in a prescription or change the agent!  For rectal discomfort: I have sent in Anusol to use per rectum twice per day.   Please complete labs today.   We will see you in 2-3 months!  It was a pleasure to see you today. I want to create trusting relationships with patients and provide genuine, compassionate, and quality care. I truly value your feedback, so please be on the lookout for a survey regarding your visit with me today. I appreciate your time in completing this!    Annitta Needs, PhD, ANP-BC [facility]

## 2022-07-23 NOTE — Telephone Encounter (Signed)
Pt in office earlier needing TCS/EGD/Dilation with Dr.Carver ASA 2. Attempted to contact patient, voicemail not set up at this time. Will send my chart message to patient.

## 2022-07-23 NOTE — Progress Notes (Signed)
Gastroenterology Office Note    Referring Provider: Alvira Monday, [place] Primary Care Physician:  Alvira Monday, FNP  Primary GI: Dr. Abbey Chatters    Chief Complaint   Chief Complaint  Patient presents with   Dysphagia    Referred for dysphagia. Has some burning after eating or drinking anything. Feels like pills get stuck. Has bloating. Nexium did not help in the past. Protonix on med list but patient states pharmacy did not fill.      History of Present Illness   Patricia Williamson is a 37 y.o. female presenting today at the request of Alvira Monday, FNP due to dysphagia.    Notes solid food dysphagia since age 89. Esophageal burning, pill dysphagia. No prior EGD. No prior colonoscopy. Notes blood in stool at times, bleeding several days at a time. Constipation intermittently. Miralax or past 4 years. No prescriptive agents. Rectal bleeding for past year. She states she has seen multiple providers regarding this but nothing offered regarding procedures or more aggressive management of constipation. Intermittent abdominal pain and bloating.    Has taken Nexium and Prilosec.       Past Medical History:  Diagnosis Date   Asthma    Carpal tunnel syndrome    Gall stones    Hx of constipation    Migraines    Nexplanon insertion 10/11/2013   Inserted left arm 10/11/13 remove 5/27 /18    Past Surgical History:  Procedure Laterality Date   CARPAL TUNNEL RELEASE Right 06/22/2022   Procedure: CARPAL TUNNEL RELEASE;  Surgeon: [name], MD;  Location: [facility];  Service: Orthopedics;  Laterality: Right;   CHOLECYSTECTOMY N/A 04/06/2014   Procedure: LAPAROSCOPIC CHOLECYSTECTOMY;  Surgeon: Jamesetta So, MD;  Location: [facility];  Service: General;  Laterality: N/A;   CYSTOSCOPY W/ URETERAL STENT PLACEMENT     EXCISION OF SKIN TAG Right 02/28/2019   Procedure: EXCISION OF GLUTEAL SKIN TAG;  Surgeon: Jonnie Kind, MD;  Location: [facility];  Service: Gynecology;  Laterality: Right;    LAPAROSCOPIC BILATERAL SALPINGECTOMY Bilateral 02/28/2019   Procedure: LAPAROSCOPIC BILATERAL SALPINGECTOMY;  Surgeon: Jonnie Kind, MD;  Location: [facility];  Service: Gynecology;  Laterality: Bilateral;   NORPLANT REMOVAL Left 02/28/2019   Procedure: REMOVAL OF NORPLANT;  Surgeon: Jonnie Kind, MD;  Location: [facility];  Service: Gynecology;  Laterality: Left;   TONSILECTOMY, ADENOIDECTOMY, BILATERAL MYRINGOTOMY AND TUBES     age 70   TONSILLECTOMY     TUBAL LIGATION      Current Outpatient Medications  Medication Sig Dispense Refill   albuterol (VENTOLIN HFA) 108 (90 Base) MCG/ACT inhaler Inhale 1-2 puffs into the lungs every 6 (six) hours as needed for wheezing or shortness of breath.     cromolyn (OPTICROM) 4 % ophthalmic solution Place 1 drop into both eyes in the morning and at bedtime.     ibuprofen (ADVIL) 600 MG tablet Take 1 tablet (600 mg total) by mouth every 6 (six) hours as needed for moderate pain or cramping. 30 tablet 6   megestrol (MEGACE) 40 MG tablet Take 2 tablets (80 mg total) by mouth 2 (two) times daily. 360 tablet 4   SUMAtriptan (IMITREX) 25 MG tablet Take 1 tablet (25 mg total) by mouth every 2 (two) hours as needed for migraine. May repeat in 2 hours if headache persists or recurs (max 200 mg per 24-hour period) 30 tablet 0   Vitamin D, Ergocalciferol, (DRISDOL) 1.25 MG (50000 UNIT) CAPS capsule Take  1 capsule (50,000 Units total) by mouth every 7 (seven) days. 7 capsule 2   XIIDRA 5 % SOLN Place 1 drop into both eyes in the morning and at bedtime.     pantoprazole (PROTONIX) 20 MG tablet Take 1 tablet (20 mg total) by mouth daily. (Patient not taking: Reported on 07/23/2022) 30 tablet 0   penicillin v potassium (VEETID) 500 MG tablet Take 1 tablet (500 mg total) by mouth 3 (three) times daily. (Patient not taking: Reported on 06/15/2022) 30 tablet 0   Riboflavin 400 MG CAPS Take 1 capsule (400 mg total) by mouth daily. (Patient not taking: Reported on 06/05/2022) 30  capsule 2   No current facility-administered medications for this visit.    Allergies as of 07/23/2022 - Review Complete 07/23/2022  Allergen Reaction Noted   Vinegar [acetic acid] Hives and Itching 03/06/2013   Sulfa antibiotics Rash 06/07/2011    Family History  Problem Relation Age of Onset   Diabetes Maternal Grandmother    Heart disease Maternal Grandmother    Hypertension Mother    Diabetes Mother    Hyperlipidemia Mother    Breast cancer Mother 71   Hypertension Brother    Other Paternal Aunt        benign breast lump    Social History   Socioeconomic History   Marital status: Married    Spouse name: Not on file   Number of children: 2   Years of education: Not on file   Highest education level: Not on file  Occupational History   Not on file  Tobacco Use   Smoking status: Every Day    Packs/day: 0.50    Years: 11.00    Total pack years: 5.50    Types: Cigarettes    Passive exposure: Current   Smokeless tobacco: Never  Vaping Use   Vaping Use: Never used  Substance and Sexual Activity   Alcohol use: No   Drug use: No   Sexual activity: Yes    Birth control/protection: Surgical    Comment: tubal  Other Topics Concern   Not on file  Social History Narrative   Not on file   Social Determinants of Health   Financial Resource Strain: Low Risk  (12/12/2021)   Overall Financial Resource Strain (CARDIA)    Difficulty of Paying Living Expenses: Not hard at all  Food Insecurity: No Food Insecurity (12/12/2021)   Hunger Vital Sign    Worried About Running Out of Food in the Last Year: Never true    Ran Out of Food in the Last Year: Never true  Transportation Williamson: No Transportation Williamson (12/12/2021)   PRAPARE - Hydrologist (Medical): No    Lack of Transportation (Non-Medical): No  Physical Activity: Sufficiently Active (12/12/2021)   Exercise Vital Sign    Days of Exercise per Week: 7 days    Minutes of Exercise per  Session: 40 min  Stress: No Stress Concern Present (12/12/2021)   [place]    Feeling of Stress : Only a little  Social Connections: Moderately Isolated (12/12/2021)   Social Connection and Isolation Panel [NHANES]    Frequency of Communication with Friends and Family: More than three times a week    Frequency of Social Gatherings with Friends and Family: Once a week    Attends Religious Services: Never    Marine scientist or Organizations: No    Attends Archivist  Meetings: Never    Marital Status: Married  Human resources officer Violence: Not At Risk (12/12/2021)   Humiliation, Afraid, Rape, and Kick questionnaire    Fear of Current or Ex-Partner: No    Emotionally Abused: No    Physically Abused: No    Sexually Abused: No     Review of Systems   Gen: Denies any fever, chills, fatigue, weight loss, lack of appetite.  CV: Denies chest pain, heart palpitations, peripheral edema, syncope.  Resp: Denies shortness of breath at rest or with exertion. Denies wheezing or cough.  GI: Denies dysphagia or odynophagia. Denies jaundice, hematemesis, fecal incontinence. GU : Denies urinary burning, urinary frequency, urinary hesitancy MS: Denies joint pain, muscle weakness, cramps, or limitation of movement.  Derm: Denies rash, itching, dry skin Psych: Denies depression, anxiety, memory loss, and confusion Heme: Denies bruising, bleeding, and enlarged lymph nodes.   Physical Exam   BP 137/87 (BP Location: Left Arm, Patient Position: Sitting, Cuff Size: Large)   Pulse 80   Temp 98.2 F (36.8 C) (Oral)   Ht 5\' 1"  (1.549 m)   Wt 194 lb 12.8 oz (88.4 kg)   BMI 36.81 kg/m  General:   Alert and oriented. Pleasant and cooperative. Well-nourished and well-developed.  Head:  Normocephalic and atraumatic. Eyes:  Without icterus Ears:  Normal auditory acuity. Lungs:  Clear to auscultation bilaterally.  Heart:  S1, S2  present without murmurs appreciated.  Abdomen:  +BS, soft, mild TTP epigastric and non-distended. No HSM noted. No guarding or rebound. No masses appreciated.  Rectal:  pain on DRE, unable to complete Msk:  Symmetrical without gross deformities. Normal posture. Extremities:  Without edema. Neurologic:  Alert and  oriented x4;  grossly normal neurologically. Skin:  Intact without significant lesions or rashes. Psych:  Alert and cooperative. Normal mood and affect.   Assessment   LONNISHA ALOISE is a 37 y.o. female presenting today at the request of Alvira Monday, FNP due to dysphagia. She is also reporting constipation and rectal bleeding upon further discussion.    Rectal bleeding: in setting of constipation. Start anusol BID. May be a good banding candidate. Recommend colonoscopy.   Constipation: trial of Linzess 290 mcg. Call with update so we may send in prescription. Abdominal pain and bloating suspected r/t constipation. Celiac serologies to be complete.   GERD: previously taken Nexium and Prilosec. Trial pantoprazole BID.   Dysphagia: solid food and pills. EGD/dilation in near future.      PLAN  Proceed with colonoscopy/EGD/dilation by Dr. Abbey Chatters  in near future: the risks, benefits, and alternatives have been discussed with the patient in detail. The patient states understanding and desires to proceed.   Start pantoprazole BID  Samples of Linzess 290 mcg daily. Call with update  Anusol BID  Celiac serologies  Return in 2-3 months     Patricia Needs, PhD, [facility]

## 2022-07-28 DIAGNOSIS — K5900 Constipation, unspecified: Secondary | ICD-10-CM | POA: Diagnosis not present

## 2022-07-29 ENCOUNTER — Other Ambulatory Visit: Payer: Self-pay | Admitting: Gastroenterology

## 2022-07-29 DIAGNOSIS — K5900 Constipation, unspecified: Secondary | ICD-10-CM

## 2022-07-29 LAB — IGA: IgA/Immunoglobulin A, Serum: 157 mg/dL (ref 87–352)

## 2022-07-29 LAB — TISSUE TRANSGLUTAMINASE, IGA: Transglutaminase IgA: <2 U/mL (ref 0–3)

## 2022-07-29 MED ORDER — LINACLOTIDE 290 MCG PO CAPS: 290.0000 ug | ORAL_CAPSULE | Freq: Every day | ORAL | 5 refills | Status: DC

## 2022-07-29 MED ORDER — PEG 3350-KCL-NA BICARB-NACL 420 G PO SOLR: 4000.0000 mL | Freq: Once | ORAL | 0 refills | Status: AC

## 2022-07-29 NOTE — Telephone Encounter (Signed)
Pt contacted and EGD/TCS scheduled. Instructions sent via my chart. Prep sent to pharmacy.

## 2022-07-29 NOTE — Addendum Note (Signed)
Addended by: Vicente Males on: 07/29/2022 03:08 PM   Modules accepted: Orders

## 2022-08-04 ENCOUNTER — Encounter: Payer: Self-pay | Admitting: Orthopedic Surgery

## 2022-08-04 ENCOUNTER — Ambulatory Visit (INDEPENDENT_AMBULATORY_CARE_PROVIDER_SITE_OTHER): Payer: Medicaid Other | Admitting: Orthopedic Surgery

## 2022-08-04 DIAGNOSIS — G5601 Carpal tunnel syndrome, right upper limb: Secondary | ICD-10-CM

## 2022-08-04 NOTE — Progress Notes (Signed)
Orthopaedic Postop Note  Assessment: Patricia Williamson is a 37 y.o. female s/p right open carpal tunnel release  DOS:  06/22/2022  Plan: Patricia Williamson has done well.  She denies numbness and tingling in the right hand.  However, she does report that she has pain in the right wrist.  No specific injury.  She notes that she has been very active, and recently cleaned out a storage unit, which required some heavy lifting.  At this point, I feel as though her pain is associated with an increase in activity.  Perhaps she is overdone it.  I offered her a brace, but she is not interested.  She will continue take medications as needed.  She will follow-up as needed.  If she is interested in pursuing surgery for similar complaints in her left hand, she will contact   Follow-up: Return if symptoms worsen or fail to improve. XR at next visit: None  Subjective:  Chief Complaint  Patient presents with   Routine Post Op    R [facility] DOS 06/22/22    History of Present Illness: Patricia Williamson is a 37 y.o. female who presents following the above stated procedure.  Surgery was approximately 6 weeks ago.  She continues to get better.  She has some tenderness in line with the incision.  No issues with the incision otherwise.  She reports complete resolution of the numbness and tingling.  She has no more burning pains.  However, she is having some pain in the right wrist.  No specific injury.  She reports that she has been very active.  She states that she helped clean out a storage unit yesterday, which included lifting heavy objects.   Review of Systems: No fevers or chills Some numbness & tingling No Chest Pain No shortness of breath   Objective: There were no vitals taken for this visit.  Physical Exam:  Alert and oriented, no acute distress.  Surgical incision is healing well.  Some tenderness to palpation. No surrounding erythema or drainage.  Sensation is intact in the median nerve distribution.   Able to make a full fist. No redness. 2+ radial pulse.  No swelling or deformity of the wrist.  No specific tenderness.  She is comfortable with flexion and extension of the wrist.  IMAGING: I personally ordered and reviewed the following images:  No new imaging obtained today  [name], MD 08/04/2022 1:00 PM

## 2022-08-06 ENCOUNTER — Ambulatory Visit: Payer: Medicaid Other | Admitting: Family Medicine

## 2022-08-06 ENCOUNTER — Encounter: Payer: Self-pay | Admitting: Family Medicine

## 2022-08-06 VITALS — BP 124/81 | HR 79 | Ht 61.0 in | Wt 198.0 lb

## 2022-08-06 DIAGNOSIS — E7849 Other hyperlipidemia: Secondary | ICD-10-CM

## 2022-08-06 DIAGNOSIS — M25361 Other instability, right knee: Secondary | ICD-10-CM | POA: Diagnosis not present

## 2022-08-06 DIAGNOSIS — E038 Other specified hypothyroidism: Secondary | ICD-10-CM | POA: Diagnosis not present

## 2022-08-06 DIAGNOSIS — R7301 Impaired fasting glucose: Secondary | ICD-10-CM | POA: Diagnosis not present

## 2022-08-06 DIAGNOSIS — G43E01 Chronic migraine with aura, not intractable, with status migrainosus: Secondary | ICD-10-CM

## 2022-08-06 DIAGNOSIS — E559 Vitamin D deficiency, unspecified: Secondary | ICD-10-CM | POA: Diagnosis not present

## 2022-08-06 MED ORDER — VITAMIN D (ERGOCALCIFEROL) 1.25 MG (50000 UNIT) PO CAPS: 50000.0000 [IU] | ORAL_CAPSULE | ORAL | 1 refills | Status: DC

## 2022-08-06 NOTE — Assessment & Plan Note (Signed)
Reports increased fatigue and noted that she has been out of her vitamin D supplement for a few weeks Refill sent to the pharmacy

## 2022-08-06 NOTE — Assessment & Plan Note (Signed)
She reports having less frequent episodes of migraines She is stable on  on triptans for abortive treatment and riboflavin for prophylactic treatment

## 2022-08-06 NOTE — Progress Notes (Signed)
Established Patient Office Visit  Subjective:  Patient ID: Patricia Williamson, female    DOB: [birth year]  Age: 37 y.o. MRN: [id number]  CC:  Chief Complaint  Patient presents with   Follow-up    3 month f/u, pt reports recent falls last week(07/29/22) on her stairs and yesterday (08/05/22) she fell in her kitchen.      HPI Patricia Williamson is a 37 y.o. female with past medical history of chronic migraines with aura, GERD, carpal tunnel syndrome bilaterally, and GERD presents for f/u of  chronic medical conditions. For the details of today's visit, please refer to the assessment and plan.     Past Medical History:  Diagnosis Date   Asthma    Carpal tunnel syndrome    Gall stones    GERD (gastroesophageal reflux disease)    Hx of constipation    Migraines    Nexplanon insertion 10/11/2013   Inserted left arm 10/11/13 remove 5/27 /18    Past Surgical History:  Procedure Laterality Date   CARPAL TUNNEL RELEASE Right 06/22/2022   Procedure: CARPAL TUNNEL RELEASE;  Surgeon: [name], MD;  Location: [facility];  Service: Orthopedics;  Laterality: Right;   CHOLECYSTECTOMY N/A 04/06/2014   Procedure: LAPAROSCOPIC CHOLECYSTECTOMY;  Surgeon: Jamesetta So, MD;  Location: [facility];  Service: General;  Laterality: N/A;   CYSTOSCOPY W/ URETERAL STENT PLACEMENT     EXCISION OF SKIN TAG Right 02/28/2019   Procedure: EXCISION OF GLUTEAL SKIN TAG;  Surgeon: Jonnie Kind, MD;  Location: [facility];  Service: Gynecology;  Laterality: Right;   LAPAROSCOPIC BILATERAL SALPINGECTOMY Bilateral 02/28/2019   Procedure: LAPAROSCOPIC BILATERAL SALPINGECTOMY;  Surgeon: Jonnie Kind, MD;  Location: [facility];  Service: Gynecology;  Laterality: Bilateral;   NORPLANT REMOVAL Left 02/28/2019   Procedure: REMOVAL OF NORPLANT;  Surgeon: Jonnie Kind, MD;  Location: [facility];  Service: Gynecology;  Laterality: Left;   TONSILECTOMY, ADENOIDECTOMY, BILATERAL MYRINGOTOMY AND TUBES     age 46   TONSILLECTOMY      TUBAL LIGATION      Family History  Problem Relation Age of Onset   Hypertension Mother    Diabetes Mother    Hyperlipidemia Mother    Breast cancer Mother 99   Cirrhosis Father        liver transplant and subsequent rejection   Hypertension Brother    Diabetes Maternal Grandmother    Heart disease Maternal Grandmother    Other Paternal Aunt        benign breast lump   Colon cancer Neg Hx    Colon polyps Neg Hx     Social History   Socioeconomic History   Marital status: Married    Spouse name: Not on file   Number of children: 2   Years of education: Not on file   Highest education level: Not on file  Occupational History   Not on file  Tobacco Use   Smoking status: Every Day    Packs/day: 0.50    Years: 11.00    Additional pack years: 0.00    Total pack years: 5.50    Types: Cigarettes    Passive exposure: Current   Smokeless tobacco: Never  Vaping Use   Vaping Use: Never used  Substance and Sexual Activity   Alcohol use: No   Drug use: No   Sexual activity: Yes    Birth control/protection: Surgical    Comment: tubal  Other Topics Concern   Not on  file  Social History Narrative   Not on file   Social Determinants of Health   Financial Resource Strain: Low Risk  (12/12/2021)   Overall Financial Resource Strain (CARDIA)    Difficulty of Paying Living Expenses: Not hard at all  Food Insecurity: No Food Insecurity (12/12/2021)   Hunger Vital Sign    Worried About Running Out of Food in the Last Year: Never true    Ran Out of Food in the Last Year: Never true  Transportation Needs: No Transportation Needs (12/12/2021)   PRAPARE - Hydrologist (Medical): No    Lack of Transportation (Non-Medical): No  Physical Activity: Sufficiently Active (12/12/2021)   Exercise Vital Sign    Days of Exercise per Week: 7 days    Minutes of Exercise per Session: 40 min  Stress: No Stress Concern Present (12/12/2021)   [place]    Feeling of Stress : Only a little  Social Connections: Moderately Isolated (12/12/2021)   Social Connection and Isolation Panel [NHANES]    Frequency of Communication with Friends and Family: More than three times a week    Frequency of Social Gatherings with Friends and Family: Once a week    Attends Religious Services: Never    Marine scientist or Organizations: No    Attends Archivist Meetings: Never    Marital Status: Married  Human resources officer Violence: Not At Risk (12/12/2021)   Humiliation, Afraid, Rape, and Kick questionnaire    Fear of Current or Ex-Partner: No    Emotionally Abused: No    Physically Abused: No    Sexually Abused: No    Outpatient Medications Prior to Visit  Medication Sig Dispense Refill   albuterol (VENTOLIN HFA) 108 (90 Base) MCG/ACT inhaler Inhale 1-2 puffs into the lungs every 6 (six) hours as needed for wheezing or shortness of breath.     cromolyn (OPTICROM) 4 % ophthalmic solution Place 1 drop into both eyes in the morning and at bedtime.     hydrocortisone (ANUSOL-HC) 2.5 % rectal cream Place 1 Application rectally 2 (two) times daily. 30 g 1   ibuprofen (ADVIL) 600 MG tablet Take 1 tablet (600 mg total) by mouth every 6 (six) hours as needed for moderate pain or cramping. 30 tablet 6   linaclotide (LINZESS) 290 MCG CAPS capsule Take 1 capsule (290 mcg total) by mouth daily before breakfast. 30 capsule 5   megestrol (MEGACE) 40 MG tablet Take 2 tablets (80 mg total) by mouth 2 (two) times daily. 360 tablet 4   pantoprazole (PROTONIX) 40 MG tablet Take 1 tablet (40 mg total) by mouth 2 (two) times daily before a meal. 60 tablet 3   SUMAtriptan (IMITREX) 25 MG tablet Take 1 tablet (25 mg total) by mouth every 2 (two) hours as needed for migraine. May repeat in 2 hours if headache persists or recurs (max 200 mg per 24-hour period) 30 tablet 0   XIIDRA 5 % SOLN Place 1 drop into both  eyes in the morning and at bedtime.     Vitamin D, Ergocalciferol, (DRISDOL) 1.25 MG (50000 UNIT) CAPS capsule Take 1 capsule (50,000 Units total) by mouth every 7 (seven) days. (Patient not taking: Reported on 08/06/2022) 7 capsule 2   No facility-administered medications prior to visit.    Allergies  Allergen Reactions   Vinegar [Acetic Acid] Hives and Itching   Sulfa Antibiotics Rash  ROS Review of Systems  Constitutional:  Negative for chills and fever.  Eyes:  Negative for visual disturbance.  Respiratory:  Negative for chest tightness and shortness of breath.   Neurological:  Negative for dizziness and headaches.      Objective:    Physical Exam HENT:     Head: Normocephalic.     Mouth/Throat:     Mouth: Mucous membranes are moist.  Cardiovascular:     Rate and Rhythm: Normal rate.     Heart sounds: Normal heart sounds.  Pulmonary:     Effort: Pulmonary effort is normal.     Breath sounds: Normal breath sounds.  Musculoskeletal:     Right knee: No swelling, deformity, erythema, ecchymosis, lacerations, bony tenderness or crepitus. Normal range of motion. No tenderness. Normal alignment.     Left knee: No swelling, deformity, erythema, ecchymosis, lacerations, bony tenderness or crepitus. Normal range of motion. No tenderness. Normal alignment.     Comments: +2 popliteal pulses bilaterally No deformity seen Palpation of the knee with no tenderness Range of motion of the right knee is intact   Neurological:     Mental Status: She is alert.     BP 124/81   Pulse 79   Ht 5\' 1"  (1.549 m)   Wt 198 lb 0.6 oz (89.8 kg)   SpO2 95%   BMI 37.42 kg/m  Wt Readings from Last 3 Encounters:  08/06/22 198 lb 0.6 oz (89.8 kg)  07/23/22 194 lb 12.8 oz (88.4 kg)  06/18/22 199 lb (90.3 kg)    Lab Results  Component Value Date   TSH 1.590 05/05/2022   Lab Results  Component Value Date   WBC 9.0 05/05/2022   HGB 16.4 (H) 05/05/2022   HCT 50.0 (H) 05/05/2022   MCV  91 05/05/2022   PLT 245 05/05/2022   Lab Results  Component Value Date   NA 140 05/05/2022   K 4.5 05/05/2022   CO2 25 05/05/2022   GLUCOSE 82 05/05/2022   BUN 6 05/05/2022   CREATININE 0.55 (L) 05/05/2022   BILITOT 0.2 05/05/2022   ALKPHOS 64 05/05/2022   AST 19 05/05/2022   ALT 17 05/05/2022   PROT 7.4 05/05/2022   ALBUMIN 4.7 05/05/2022   CALCIUM 9.8 05/05/2022   ANIONGAP 7 10/01/2021   EGFR 123 05/05/2022   Lab Results  Component Value Date   CHOL 262 (H) 05/05/2022   Lab Results  Component Value Date   HDL 36 (L) 05/05/2022   Lab Results  Component Value Date   LDLCALC 187 (H) 05/05/2022   Lab Results  Component Value Date   TRIG 204 (H) 05/05/2022   Lab Results  Component Value Date   CHOLHDL 7.3 (H) 05/05/2022   Lab Results  Component Value Date   HGBA1C 5.6 05/05/2022      Assessment & Plan:  Knee instability, right Assessment & Plan: History of a torn ligament in the right knee in her mid 20s She reports buckling of her right knee since January 2024 with recurrent falls She has had 3 falls from the instability of the right knee with the most recent occurring yesterday 08/05/2022 while helping her husband take the washer machine upstairs She complains of knee pain and stiffness in the morning Stiffness usually occurs for 15 minutes before she can ambulate in the morning She denies popping, clicking, and locking sensation Range of motion of the right knee is intact, with no evidence of effusion or inflammation noted Sensation of  the right lower extremity is intact +5 muscle strength in the lower extremities No pain was reported today Will get imaging study of the right knee to assess for ligament tear  Referral placed to physical therapy to strengthen the quadricep and hamstring muscle  Orders: -     DG Knee 1-2 Views Right -     Ambulatory referral to Physical Therapy  Chronic migraine with aura, not intractable, with status  migrainosus Assessment & Plan: She reports having less frequent episodes of migraines She is stable on  on triptans for abortive treatment and riboflavin for prophylactic treatment    Vitamin D deficiency Assessment & Plan: Reports increased fatigue and noted that she has been out of her vitamin D supplement for a few weeks Refill sent to the pharmacy  Orders: -     VITAMIN D 25 Hydroxy (Vit-D Deficiency, Fractures) -     Vitamin D (Ergocalciferol); Take 1 capsule (50,000 Units total) by mouth every 7 (seven) days.  Dispense: 20 capsule; Refill: 1  Other specified hypothyroidism  Other hyperlipidemia -     CBC with Differential/Platelet -     CMP14+EGFR -     Lipid panel  Impaired fasting blood sugar -     Hemoglobin A1c    Follow-up: Return in about 3 months (around 11/06/2022).   Alvira Monday, FNP

## 2022-08-06 NOTE — Patient Instructions (Addendum)
I appreciate the opportunity to provide care to you today!    Follow up:  3 months  Labs: please stop by the lab today/during the week to get your blood drawn (CBC, CMP, TSH, Lipid profile, HgA1c, Vit D)   Please pick up your refill at the pharmacy  Referrals today-  Physical Therapy to strengthen muscles to support her knee such as the quadriceps and hamstrings   Please continue to a heart-healthy diet and increase your physical activities. Try to exercise for 36mins at least five days a week.      It was a pleasure to see you and I look forward to continuing to work together on your health and well-being. Please do not hesitate to call the office if you need care or have questions about your care.   Have a wonderful day and week. With Gratitude, Alvira Monday MSN, FNP-BC

## 2022-08-06 NOTE — Assessment & Plan Note (Signed)
History of a torn ligament in the right knee in her mid 18s She reports buckling of her right knee since January 2024 with recurrent falls She has had 3 falls from the instability of the right knee with the most recent occurring yesterday 08/05/2022 while helping her husband take the washer machine upstairs She complains of knee pain and stiffness in the morning Stiffness usually occurs for 15 minutes before she can ambulate in the morning She denies popping, clicking, and locking sensation Range of motion of the right knee is intact, with no evidence of effusion or inflammation noted Sensation of the right lower extremity is intact +5 muscle strength in the lower extremities No pain was reported today Will get imaging study of the right knee to assess for ligament tear  Referral placed to physical therapy to strengthen the quadricep and hamstring muscle

## 2022-08-07 LAB — LIPID PANEL
Chol/HDL Ratio: 10.3 ratio — ABNORMAL HIGH (ref 0.0–4.4)
Cholesterol, Total: 246 mg/dL — ABNORMAL HIGH (ref 100–199)
HDL: 24 mg/dL — ABNORMAL LOW (ref >39)
LDL Chol Calc (NIH): 169 mg/dL — ABNORMAL HIGH (ref 0–99)
Triglycerides: 276 mg/dL — ABNORMAL HIGH (ref 0–149)
VLDL Cholesterol Cal: 53 mg/dL — ABNORMAL HIGH (ref 5–40)

## 2022-08-07 LAB — CMP14+EGFR
ALT: 16 IU/L (ref 0–32)
AST: 12 IU/L (ref 0–40)
Albumin/Globulin Ratio: 1.6 (ref 1.2–2.2)
Albumin: 4.4 g/dL (ref 3.9–4.9)
Alkaline Phosphatase: 57 IU/L (ref 44–121)
BUN/Creatinine Ratio: 10 (ref 9–23)
BUN: 7 mg/dL (ref 6–20)
Bilirubin Total: <0.2 mg/dL (ref 0.0–1.2)
CO2: 18 mmol/L — ABNORMAL LOW (ref 20–29)
Calcium: 9.5 mg/dL (ref 8.7–10.2)
Chloride: 109 mmol/L — ABNORMAL HIGH (ref 96–106)
Creatinine, Ser: 0.68 mg/dL (ref 0.57–1.00)
Globulin, Total: 2.7 g/dL (ref 1.5–4.5)
Glucose: 88 mg/dL (ref 70–99)
Potassium: 4 mmol/L (ref 3.5–5.2)
Sodium: 143 mmol/L (ref 134–144)
Total Protein: 7.1 g/dL (ref 6.0–8.5)
eGFR: 117 mL/min/{1.73_m2} (ref >59)

## 2022-08-07 LAB — CBC WITH DIFFERENTIAL/PLATELET
Basophils Absolute: 0.1 10*3/uL (ref 0.0–0.2)
Basos: 1 %
EOS (ABSOLUTE): 0.2 10*3/uL (ref 0.0–0.4)
Eos: 2 %
Hematocrit: 49.4 % — ABNORMAL HIGH (ref 34.0–46.6)
Hemoglobin: 16.1 g/dL — ABNORMAL HIGH (ref 11.1–15.9)
Immature Grans (Abs): 0.1 10*3/uL (ref 0.0–0.1)
Immature Granulocytes: 1 %
Lymphocytes Absolute: 3 10*3/uL (ref 0.7–3.1)
Lymphs: 34 %
MCH: 29.9 pg (ref 26.6–33.0)
MCHC: 32.6 g/dL (ref 31.5–35.7)
MCV: 92 fL (ref 79–97)
Monocytes Absolute: 0.6 10*3/uL (ref 0.1–0.9)
Monocytes: 7 %
Neutrophils Absolute: 4.9 10*3/uL (ref 1.4–7.0)
Neutrophils: 55 %
Platelets: 287 10*3/uL (ref 150–450)
RBC: 5.38 x10E6/uL — ABNORMAL HIGH (ref 3.77–5.28)
RDW: 12.7 % (ref 11.7–15.4)
WBC: 8.9 10*3/uL (ref 3.4–10.8)

## 2022-08-07 LAB — VITAMIN D 25 HYDROXY (VIT D DEFICIENCY, FRACTURES): Vit D, 25-Hydroxy: 17.4 ng/mL — ABNORMAL LOW (ref 30.0–100.0)

## 2022-08-07 LAB — HEMOGLOBIN A1C
Est. average glucose Bld gHb Est-mCnc: 123 mg/dL
Hgb A1c MFr Bld: 5.9 % — ABNORMAL HIGH (ref 4.8–5.6)

## 2022-08-10 ENCOUNTER — Other Ambulatory Visit: Payer: Self-pay | Admitting: Family Medicine

## 2022-08-10 DIAGNOSIS — E7849 Other hyperlipidemia: Secondary | ICD-10-CM

## 2022-08-10 MED ORDER — ROSUVASTATIN CALCIUM 10 MG PO TABS: 10.0000 mg | ORAL_TABLET | Freq: Every day | ORAL | 1 refills | Status: DC

## 2022-08-24 ENCOUNTER — Telehealth: Payer: Self-pay | Admitting: *Deleted

## 2022-08-24 NOTE — Telephone Encounter (Signed)
error 

## 2022-08-26 ENCOUNTER — Telehealth (INDEPENDENT_AMBULATORY_CARE_PROVIDER_SITE_OTHER): Payer: Self-pay | Admitting: Internal Medicine

## 2022-08-26 NOTE — Telephone Encounter (Signed)
Pt left voicemail needing to rescheduled EGD/TCS for this Friday due to transportation issues.   Pt contacted and rescheduled to 09/28/22.

## 2022-09-01 ENCOUNTER — Ambulatory Visit (HOSPITAL_COMMUNITY): Payer: Medicaid Other | Admitting: Physical Therapy

## 2022-09-10 ENCOUNTER — Ambulatory Visit: Payer: Medicaid Other | Admitting: Family Medicine

## 2022-09-10 VITALS — BP 136/85 | HR 77 | Resp 16 | Ht 61.0 in | Wt 200.8 lb

## 2022-09-10 DIAGNOSIS — F419 Anxiety disorder, unspecified: Secondary | ICD-10-CM | POA: Insufficient documentation

## 2022-09-10 MED ORDER — ESCITALOPRAM OXALATE 10 MG PO TABS: 10.0000 mg | ORAL_TABLET | Freq: Every day | ORAL | 3 refills | Status: DC

## 2022-09-10 MED ORDER — HYDROXYZINE PAMOATE 25 MG PO CAPS: 25.0000 mg | ORAL_CAPSULE | Freq: Three times a day (TID) | ORAL | 0 refills | Status: DC | PRN

## 2022-09-10 MED ORDER — ALBUTEROL SULFATE HFA 108 (90 BASE) MCG/ACT IN AERS: 1.0000 | INHALATION_SPRAY | Freq: Four times a day (QID) | RESPIRATORY_TRACT | 3 refills | Status: AC | PRN

## 2022-09-10 NOTE — Patient Instructions (Signed)
        Great to see you today.   - Please take medications as prescribed. - Follow up with your primary health provider if any health concerns arises. - If symptoms worsen please contact your primary care provider and/or visit the emergency department.  

## 2022-09-10 NOTE — Assessment & Plan Note (Signed)
    09/10/2022    9:44 AM 08/06/2022    8:11 AM 06/05/2022    9:39 AM 05/05/2022    9:58 AM  GAD 7 : Generalized Anxiety Score  Nervous, Anxious, on Edge 3 0 1 1  Control/stop worrying 3 0 0 3  Worry too much - different things 3 0 0 3  Trouble relaxing 3 0 1 0  Restless 3 0 0 0  Easily annoyed or irritable 3 0 1 3  Afraid - awful might happen 0 0 0 0  Total GAD 7 Score 18 0 3 10  Anxiety Difficulty Somewhat difficult Not difficult at all Somewhat difficult Somewhat difficult  Lexapro 10 mg daily and Hydroxyzine 25 mg PRN Discussed about cognitive behavioral therapy focusing on thoughts, belief, and attitudes that affects feelings and behavior, learning about coping skills to deal with certain problems. Maintaining a consistent routine and schedule, Practice stress management and self calming techniques, excersise regularly and spend time outdoors, Do not eat food that are high in fat, added sugar, or salt. Follow up in 6 weeks

## 2022-09-10 NOTE — Progress Notes (Signed)
Patient Office Visit   Subjective   Patient ID: Patricia Williamson, female    DOB: 1985-12-02  Age: 37 y.o. MRN: [id number]  CC:  Chief Complaint  Patient presents with   Skin itching    Itches all over her body x a few weeks, even face. Hasn't changed anything with her routine or cleaner. Has been using same soap for a long time. No red ness or bumps but skin itches very bad, especially after shower. Benadryl makes her too sleepy to take    HPI [name] 37 year old female, presents to the clinic for anxiety. She  has a past medical history of Asthma, Carpal tunnel syndrome, Gall stones, GERD (gastroesophageal reflux disease), constipation, Migraines, and Nexplanon insertion (10/11/2013).  Anxiety Presents for initial visit. Patient reports anxiety has been gradually worsening. Symptoms include decreased concentration, excessive worry, insomnia, irritability, muscle tension, nervous/anxious behavior, restlessness and shortness of breath. Patient reports no suicidal ideas. Symptoms occur most days. The severity of symptoms is causing significant distress. The symptoms are aggravated by specific phobias and family workload, caregiver strain. The patient sleeps 4 hours per night. The quality of sleep is poor. Nighttime awakenings: several. Risk factors include family history and a major life event. Her past medical history is significant for anxiety/panic attacks. Past treatments include lifestyle changes.      Outpatient Encounter Medications as of 09/10/2022  Medication Sig   escitalopram (LEXAPRO) 10 MG tablet Take 1 tablet (10 mg total) by mouth daily.   hydrOXYzine (VISTARIL) 25 MG capsule Take 1 capsule (25 mg total) by mouth every 8 (eight) hours as needed.   linaclotide (LINZESS) 290 MCG CAPS capsule Take 1 capsule (290 mcg total) by mouth daily before breakfast.   megestrol (MEGACE) 40 MG tablet Take 40 mg by mouth 2 (two) times daily.   ondansetron (ZOFRAN) 4 MG tablet Take 4  mg by mouth every 8 (eight) hours as needed for nausea or vomiting.   pantoprazole (PROTONIX) 40 MG tablet Take 1 tablet (40 mg total) by mouth 2 (two) times daily before a meal. (Patient taking differently: Take 40 mg by mouth daily.)   rosuvastatin (CRESTOR) 10 MG tablet Take 1 tablet (10 mg total) by mouth daily.   SUMAtriptan (IMITREX) 25 MG tablet Take 1 tablet (25 mg total) by mouth every 2 (two) hours as needed for migraine. May repeat in 2 hours if headache persists or recurs (max 200 mg per 24-hour period)   Vitamin D, Ergocalciferol, (DRISDOL) 1.25 MG (50000 UNIT) CAPS capsule Take 1 capsule (50,000 Units total) by mouth every 7 (seven) days.   [DISCONTINUED] albuterol (VENTOLIN HFA) 108 (90 Base) MCG/ACT inhaler Inhale 1-2 puffs into the lungs every 6 (six) hours as needed for wheezing or shortness of breath.   [DISCONTINUED] escitalopram (LEXAPRO) 10 MG tablet Take 1 tablet (10 mg total) by mouth daily.   [DISCONTINUED] hydrOXYzine (VISTARIL) 25 MG capsule Take 1 capsule (25 mg total) by mouth every 8 (eight) hours as needed.   [DISCONTINUED] ibuprofen (ADVIL) 600 MG tablet Take 1 tablet (600 mg total) by mouth every 6 (six) hours as needed for moderate pain or cramping.   albuterol (VENTOLIN HFA) 108 (90 Base) MCG/ACT inhaler Inhale 1-2 puffs into the lungs every 6 (six) hours as needed for wheezing or shortness of breath.   No facility-administered encounter medications on file as of 09/10/2022.    Past Surgical History:  Procedure Laterality Date   CARPAL TUNNEL RELEASE Right 06/22/2022  Procedure: CARPAL TUNNEL RELEASE;  Surgeon: Oliver Barre, MD;  Location: [facility];  Service: Orthopedics;  Laterality: Right;   CHOLECYSTECTOMY N/A 04/06/2014   Procedure: LAPAROSCOPIC CHOLECYSTECTOMY;  Surgeon: Dalia Heading, MD;  Location: [facility];  Service: General;  Laterality: N/A;   CYSTOSCOPY W/ URETERAL STENT PLACEMENT     EXCISION OF SKIN TAG Right 02/28/2019   Procedure: EXCISION OF  GLUTEAL SKIN TAG;  Surgeon: Tilda Burrow, MD;  Location: [facility];  Service: Gynecology;  Laterality: Right;   LAPAROSCOPIC BILATERAL SALPINGECTOMY Bilateral 02/28/2019   Procedure: LAPAROSCOPIC BILATERAL SALPINGECTOMY;  Surgeon: Tilda Burrow, MD;  Location: [facility];  Service: Gynecology;  Laterality: Bilateral;   NORPLANT REMOVAL Left 02/28/2019   Procedure: REMOVAL OF NORPLANT;  Surgeon: Tilda Burrow, MD;  Location: [facility];  Service: Gynecology;  Laterality: Left;   TONSILECTOMY, ADENOIDECTOMY, BILATERAL MYRINGOTOMY AND TUBES     age 36   TONSILLECTOMY     TUBAL LIGATION      Review of Systems  Constitutional:  Negative for chills and fever.  HENT:  Negative for tinnitus.   Eyes:  Negative for blurred vision.  Respiratory:  Positive for wheezing. Negative for cough, hemoptysis, sputum production and shortness of breath.   Cardiovascular:  Negative for chest pain.  Gastrointestinal:  Negative for abdominal pain.  Genitourinary:  Negative for dysuria.  Skin:  Positive for itching. Negative for rash.  Neurological:  Negative for dizziness and headaches.  Psychiatric/Behavioral:  Negative for depression and suicidal ideas. The patient is nervous/anxious and has insomnia.       Objective    BP 136/85   Pulse 77   Resp 16   Ht 5\' 1"  (1.549 m)   Wt 200 lb 12.8 oz (91.1 kg)   SpO2 95%   BMI 37.94 kg/m   Physical Exam Vitals reviewed.  Constitutional:      General: She is not in acute distress.    Appearance: Normal appearance. She is not ill-appearing, toxic-appearing or diaphoretic.  HENT:     Head: Normocephalic.  Eyes:     General:        Right eye: No discharge.        Left eye: No discharge.     Conjunctiva/sclera: Conjunctivae normal.  Cardiovascular:     Rate and Rhythm: Normal rate.     Pulses: Normal pulses.     Heart sounds: Normal heart sounds.  Pulmonary:     Effort: Pulmonary effort is normal.     Breath sounds: Wheezing present.  Musculoskeletal:         General: Normal range of motion.     Cervical back: Normal range of motion.  Skin:    General: Skin is warm and dry.     Capillary Refill: Capillary refill takes less than 2 seconds.  Neurological:     General: No focal deficit present.     Mental Status: She is alert and oriented to person, place, and time.     Coordination: Coordination normal.     Gait: Gait normal.  Psychiatric:        Mood and Affect: Mood normal.        Behavior: Behavior normal.       Assessment & Plan:  Anxiety Assessment & Plan:    09/10/2022    9:44 AM 08/06/2022    8:11 AM 06/05/2022    9:39 AM 05/05/2022    9:58 AM  GAD 7 : Generalized Anxiety Score  Nervous,  Anxious, on Edge 3 0 1 1  Control/stop worrying 3 0 0 3  Worry too much - different things 3 0 0 3  Trouble relaxing 3 0 1 0  Restless 3 0 0 0  Easily annoyed or irritable 3 0 1 3  Afraid - awful might happen 0 0 0 0  Total GAD 7 Score 18 0 3 10  Anxiety Difficulty Somewhat difficult Not difficult at all Somewhat difficult Somewhat difficult  Lexapro 10 mg daily and Hydroxyzine 25 mg PRN Discussed about cognitive behavioral therapy focusing on thoughts, belief, and attitudes that affects feelings and behavior, learning about coping skills to deal with certain problems. Maintaining a consistent routine and schedule, Practice stress management and self calming techniques, excersise regularly and spend time outdoors, Do not eat food that are high in fat, added sugar, or salt. Follow up in 6 weeks   Orders: -     Escitalopram Oxalate; Take 1 tablet (10 mg total) by mouth daily.  Dispense: 30 tablet; Refill: 3 -     hydrOXYzine Pamoate; Take 1 capsule (25 mg total) by mouth every 8 (eight) hours as needed.  Dispense: 30 capsule; Refill: 0  Other orders -     Albuterol Sulfate HFA; Inhale 1-2 puffs into the lungs every 6 (six) hours as needed for wheezing or shortness of breath.  Dispense: 8 g; Refill: 3    Return in about 6 weeks  (around 10/22/2022) for Anxiety.   Cruzita Lederer Newman Nip, FNP

## 2022-09-22 ENCOUNTER — Encounter: Payer: Self-pay | Admitting: Family Medicine

## 2022-09-23 ENCOUNTER — Encounter (HOSPITAL_COMMUNITY): Payer: Self-pay

## 2022-09-23 ENCOUNTER — Encounter (HOSPITAL_COMMUNITY)
Admission: RE | Admit: 2022-09-23 | Discharge: 2022-09-23 | Disposition: A | Discharge status: Home / Self Care | Payer: Medicaid Other | Source: Ambulatory Visit | Attending: Internal Medicine | Admitting: Internal Medicine

## 2022-09-23 VITALS — Ht 61.0 in | Wt 200.8 lb

## 2022-09-23 DIAGNOSIS — Z01818 Encounter for other preprocedural examination: Secondary | ICD-10-CM

## 2022-09-27 ENCOUNTER — Encounter (HOSPITAL_COMMUNITY): Payer: Self-pay | Admitting: Anesthesiology

## 2022-09-28 ENCOUNTER — Ambulatory Visit (HOSPITAL_COMMUNITY): Admission: RE | Admit: 2022-09-28 | Payer: Medicaid Other | Source: Home / Self Care

## 2022-09-28 ENCOUNTER — Encounter (HOSPITAL_COMMUNITY): Admission: RE | Payer: Self-pay | Source: Home / Self Care

## 2022-09-28 DIAGNOSIS — K625 Hemorrhage of anus and rectum: Secondary | ICD-10-CM

## 2022-09-28 DIAGNOSIS — K5900 Constipation, unspecified: Secondary | ICD-10-CM

## 2022-09-28 DIAGNOSIS — R1310 Dysphagia, unspecified: Secondary | ICD-10-CM

## 2022-09-28 SURGERY — ESOPHAGOGASTRODUODENOSCOPY (EGD) WITH PROPOFOL
Anesthesia: Monitor Anesthesia Care

## 2022-09-28 NOTE — Progress Notes (Signed)
No show for procedure. No answer on phone. Dr Marletta Lor aware. No new orders given.

## 2022-10-07 ENCOUNTER — Other Ambulatory Visit: Payer: Self-pay | Admitting: Family Medicine

## 2022-10-07 DIAGNOSIS — F419 Anxiety disorder, unspecified: Secondary | ICD-10-CM

## 2022-10-07 MED ORDER — HYDROXYZINE PAMOATE 25 MG PO CAPS: 25.0000 mg | ORAL_CAPSULE | Freq: Three times a day (TID) | ORAL | 0 refills | Status: DC | PRN

## 2022-10-09 ENCOUNTER — Other Ambulatory Visit: Payer: Self-pay | Admitting: Family Medicine

## 2022-10-09 DIAGNOSIS — F419 Anxiety disorder, unspecified: Secondary | ICD-10-CM

## 2022-10-09 MED ORDER — HYDROXYZINE PAMOATE 25 MG PO CAPS: 25.0000 mg | ORAL_CAPSULE | Freq: Three times a day (TID) | ORAL | 0 refills | Status: DC | PRN

## 2022-10-15 ENCOUNTER — Encounter: Payer: Self-pay | Admitting: Gastroenterology

## 2022-11-12 ENCOUNTER — Ambulatory Visit: Payer: Medicaid Other | Admitting: Family Medicine

## 2022-11-18 ENCOUNTER — Ambulatory Visit: Payer: Medicaid Other | Admitting: Family Medicine

## 2022-11-18 ENCOUNTER — Encounter: Payer: Self-pay | Admitting: Family Medicine

## 2022-11-18 VITALS — BP 126/78 | HR 104 | Ht 61.0 in | Wt 207.1 lb

## 2022-11-18 DIAGNOSIS — F419 Anxiety disorder, unspecified: Secondary | ICD-10-CM

## 2022-11-18 MED ORDER — HYDROXYZINE PAMOATE 25 MG PO CAPS: 25.0000 mg | ORAL_CAPSULE | Freq: Three times a day (TID) | ORAL | 0 refills | Status: DC | PRN

## 2022-11-18 MED ORDER — ESCITALOPRAM OXALATE 20 MG PO TABS: 20.0000 mg | ORAL_TABLET | Freq: Every day | ORAL | 1 refills | Status: DC

## 2022-11-18 NOTE — Assessment & Plan Note (Signed)
Report some relief with lexapro 10 mg daily Denies SI and HI Will increase lexapro to 20 mg daily Reviewed nonpharmacologic methods to decreased anxiety Will f/u in 6 weeks

## 2022-11-18 NOTE — Patient Instructions (Addendum)
I appreciate the opportunity to provide care to you today!    Follow up:  6 weeks  Please pick up your refills at the pharmacy   Please continue to a heart-healthy diet and increase your physical activities. Try to exercise for at least five days a week.      It was a pleasure to see you and I look forward to continuing to work together on your health and well-being. Please do not hesitate to call the office if you need care or have questions about your care.   Have a wonderful day and week. With Gratitude, Gilmore Laroche MSN, FNP-BC

## 2022-11-18 NOTE — Progress Notes (Signed)
Established Patient Office Visit  Subjective:  Patient ID: Patricia Williamson, female    DOB: 01-04-1986  Age: 37 y.o. MRN: [id number]  CC:  Chief Complaint  Patient presents with   Chronic Care Management    3 month f/u, states lexapro not effective. Needs a refill on hydroxyzine.     HPI Patricia Williamson is a 37 y.o. female with past medical history of anxiety presents for 6 weeks f/u. For the details of today's visit, please refer to the assessment and plan.     Past Medical History:  Diagnosis Date   Asthma    Carpal tunnel syndrome    Gall stones    GERD (gastroesophageal reflux disease)    Hx of constipation    Migraines    Nexplanon insertion 10/11/2013   Inserted left arm 10/11/13 remove 5/27 /18    Past Surgical History:  Procedure Laterality Date   CARPAL TUNNEL RELEASE Right 06/22/2022   Procedure: CARPAL TUNNEL RELEASE;  Surgeon: Oliver Barre, MD;  Location: [facility];  Service: Orthopedics;  Laterality: Right;   CHOLECYSTECTOMY N/A 04/06/2014   Procedure: LAPAROSCOPIC CHOLECYSTECTOMY;  Surgeon: Dalia Heading, MD;  Location: [facility];  Service: General;  Laterality: N/A;   CYSTOSCOPY W/ URETERAL STENT PLACEMENT     EXCISION OF SKIN TAG Right 02/28/2019   Procedure: EXCISION OF GLUTEAL SKIN TAG;  Surgeon: Tilda Burrow, MD;  Location: [facility];  Service: Gynecology;  Laterality: Right;   LAPAROSCOPIC BILATERAL SALPINGECTOMY Bilateral 02/28/2019   Procedure: LAPAROSCOPIC BILATERAL SALPINGECTOMY;  Surgeon: Tilda Burrow, MD;  Location: [facility];  Service: Gynecology;  Laterality: Bilateral;   NORPLANT REMOVAL Left 02/28/2019   Procedure: REMOVAL OF NORPLANT;  Surgeon: Tilda Burrow, MD;  Location: [facility];  Service: Gynecology;  Laterality: Left;   TONSILECTOMY, ADENOIDECTOMY, BILATERAL MYRINGOTOMY AND TUBES     age 45   TONSILLECTOMY     TUBAL LIGATION      Family History  Problem Relation Age of Onset   Hypertension Mother    Diabetes Mother    Hyperlipidemia  Mother    Breast cancer Mother 28   Cirrhosis Father        liver transplant and subsequent rejection   Hypertension Brother    Diabetes Maternal Grandmother    Heart disease Maternal Grandmother    Other Paternal Aunt        benign breast lump   Colon cancer Neg Hx    Colon polyps Neg Hx     Social History   Socioeconomic History   Marital status: Married    Spouse name: Not on file   Number of children: 2   Years of education: Not on file   Highest education level: 12th grade  Occupational History   Not on file  Tobacco Use   Smoking status: Every Day    Packs/day: 1.00    Years: 11.00    Additional pack years: 0.00    Total pack years: 11.00    Types: Cigarettes    Passive exposure: Current   Smokeless tobacco: Never  Vaping Use   Vaping Use: Never used  Substance and Sexual Activity   Alcohol use: No   Drug use: No   Sexual activity: Yes    Birth control/protection: Surgical    Comment: tubal  Other Topics Concern   Not on file  Social History Narrative   Not on file   Social Determinants of Health   Financial Resource Strain:  Low Risk  (09/10/2022)   Overall Financial Resource Strain (CARDIA)    Difficulty of Paying Living Expenses: Not hard at all  Food Insecurity: No Food Insecurity (09/10/2022)   Hunger Vital Sign    Worried About Running Out of Food in the Last Year: Never true    Ran Out of Food in the Last Year: Never true  Transportation Needs: No Transportation Needs (09/10/2022)   PRAPARE - Administrator, Civil Service (Medical): No    Lack of Transportation (Non-Medical): No  Physical Activity: Inactive (09/10/2022)   Exercise Vital Sign    Days of Exercise per Week: 0 days    Minutes of Exercise per Session: 40 min  Stress: Patient Declined (09/10/2022)   [employer] of Occupational Health - Occupational Stress Questionnaire    Feeling of Stress : Patient declined  Social Connections: Moderately Isolated (09/10/2022)    Social Connection and Isolation Panel [NHANES]    Frequency of Communication with Friends and Family: More than three times a week    Frequency of Social Gatherings with Friends and Family: More than three times a week    Attends Religious Services: Never    Database administrator or Organizations: No    Attends Banker Meetings: Never    Marital Status: Married  Catering manager Violence: Not At Risk (12/12/2021)   Humiliation, Afraid, Rape, and Kick questionnaire    Fear of Current or Ex-Partner: No    Emotionally Abused: No    Physically Abused: No    Sexually Abused: No    Outpatient Medications Prior to Visit  Medication Sig Dispense Refill   albuterol (VENTOLIN HFA) 108 (90 Base) MCG/ACT inhaler Inhale 1-2 puffs into the lungs every 6 (six) hours as needed for wheezing or shortness of breath. 8 g 3   linaclotide (LINZESS) 290 MCG CAPS capsule Take 1 capsule (290 mcg total) by mouth daily before breakfast. 30 capsule 5   megestrol (MEGACE) 40 MG tablet Take 40 mg by mouth 2 (two) times daily.     ondansetron (ZOFRAN) 4 MG tablet Take 4 mg by mouth every 8 (eight) hours as needed for nausea or vomiting.     pantoprazole (PROTONIX) 40 MG tablet Take 1 tablet (40 mg total) by mouth 2 (two) times daily before a meal. (Patient taking differently: Take 40 mg by mouth daily.) 60 tablet 3   rosuvastatin (CRESTOR) 10 MG tablet Take 1 tablet (10 mg total) by mouth daily. 90 tablet 1   SUMAtriptan (IMITREX) 25 MG tablet Take 1 tablet (25 mg total) by mouth every 2 (two) hours as needed for migraine. May repeat in 2 hours if headache persists or recurs (max 200 mg per 24-hour period) 30 tablet 0   Vitamin D, Ergocalciferol, (DRISDOL) 1.25 MG (50000 UNIT) CAPS capsule Take 1 capsule (50,000 Units total) by mouth every 7 (seven) days. 20 capsule 1   escitalopram (LEXAPRO) 10 MG tablet Take 1 tablet (10 mg total) by mouth daily. 30 tablet 3   hydrOXYzine (VISTARIL) 25 MG capsule Take 1  capsule (25 mg total) by mouth every 8 (eight) hours as needed. 30 capsule 0   No facility-administered medications prior to visit.    Allergies  Allergen Reactions   Vinegar [Acetic Acid] Hives and Itching   Sulfa Antibiotics Rash    ROS Review of Systems  Constitutional:  Negative for chills and fever.  Eyes:  Negative for visual disturbance.  Respiratory:  Negative for chest tightness  and shortness of breath.   Neurological:  Negative for dizziness and headaches.      Objective:    Physical Exam HENT:     Head: Normocephalic.     Mouth/Throat:     Mouth: Mucous membranes are moist.  Cardiovascular:     Rate and Rhythm: Normal rate.     Heart sounds: Normal heart sounds.  Pulmonary:     Effort: Pulmonary effort is normal.     Breath sounds: Normal breath sounds.  Neurological:     Mental Status: She is alert.     BP 126/78   Pulse (!) 104   Ht 5\' 1"  (1.549 m)   Wt 207 lb 1.9 oz (93.9 kg)   SpO2 94%   BMI 39.13 kg/m  Wt Readings from Last 3 Encounters:  11/18/22 207 lb 1.9 oz (93.9 kg)  09/23/22 200 lb 13.4 oz (91.1 kg)  09/10/22 200 lb 12.8 oz (91.1 kg)    Lab Results  Component Value Date   TSH 1.590 05/05/2022   Lab Results  Component Value Date   WBC 8.9 08/06/2022   HGB 16.1 (H) 08/06/2022   HCT 49.4 (H) 08/06/2022   MCV 92 08/06/2022   PLT 287 08/06/2022   Lab Results  Component Value Date   NA 143 08/06/2022   K 4.0 08/06/2022   CO2 18 (L) 08/06/2022   GLUCOSE 88 08/06/2022   BUN 7 08/06/2022   CREATININE 0.68 08/06/2022   BILITOT <0.2 08/06/2022   ALKPHOS 57 08/06/2022   AST 12 08/06/2022   ALT 16 08/06/2022   PROT 7.1 08/06/2022   ALBUMIN 4.4 08/06/2022   CALCIUM 9.5 08/06/2022   ANIONGAP 7 10/01/2021   EGFR 117 08/06/2022   Lab Results  Component Value Date   CHOL 246 (H) 08/06/2022   Lab Results  Component Value Date   HDL 24 (L) 08/06/2022   Lab Results  Component Value Date   LDLCALC 169 (H) 08/06/2022   Lab  Results  Component Value Date   TRIG 276 (H) 08/06/2022   Lab Results  Component Value Date   CHOLHDL 10.3 (H) 08/06/2022   Lab Results  Component Value Date   HGBA1C 5.9 (H) 08/06/2022      Assessment & Plan:  Anxiety Assessment & Plan: Report some relief with lexapro 10 mg daily Denies SI and HI Will increase lexapro to 20 mg daily Reviewed nonpharmacologic methods to decreased anxiety Will f/u in 6 weeks   Orders: -     hydrOXYzine Pamoate; Take 1 capsule (25 mg total) by mouth every 8 (eight) hours as needed.  Dispense: 30 capsule; Refill: 0 -     Escitalopram Oxalate; Take 1 tablet (20 mg total) by mouth daily.  Dispense: 30 tablet; Refill: 1    Follow-up: Return in about 6 weeks (around 12/30/2022).   Gilmore Laroche, FNP

## 2022-11-23 ENCOUNTER — Ambulatory Visit: Payer: Medicaid Other | Admitting: Family Medicine

## 2022-12-29 ENCOUNTER — Encounter: Payer: Self-pay | Admitting: Obstetrics & Gynecology

## 2022-12-29 ENCOUNTER — Ambulatory Visit: Payer: Medicaid Other | Admitting: Obstetrics & Gynecology

## 2022-12-29 VITALS — BP 133/89 | HR 52 | Ht 61.0 in | Wt 220.2 lb

## 2022-12-29 DIAGNOSIS — K5900 Constipation, unspecified: Secondary | ICD-10-CM | POA: Diagnosis not present

## 2022-12-29 DIAGNOSIS — F419 Anxiety disorder, unspecified: Secondary | ICD-10-CM

## 2022-12-29 DIAGNOSIS — N921 Excessive and frequent menstruation with irregular cycle: Secondary | ICD-10-CM | POA: Diagnosis not present

## 2022-12-29 DIAGNOSIS — R102 Pelvic and perineal pain unspecified side: Secondary | ICD-10-CM

## 2022-12-29 DIAGNOSIS — Z9851 Tubal ligation status: Secondary | ICD-10-CM | POA: Diagnosis not present

## 2022-12-29 DIAGNOSIS — M5440 Lumbago with sciatica, unspecified side: Secondary | ICD-10-CM

## 2022-12-29 NOTE — Progress Notes (Signed)
GYN VISIT Patient name: Patricia Williamson MRN [id number]  Date of birth: June 15, 1985 Chief Complaint:   Follow-up (Wants to discuss symptoms that she is having, concerned that it could be related to megace. )  History of Present Illness:   Patricia Williamson is a 37 y.o. G57P1102 female being seen today for the following concerns:  -She notes that she has been struggling with a variety of symptoms and has been seen by "all "her docs.  The thought was that maybe the Megace was causing some of her symptoms and she was advised to follow-up in our office.  Patient has been on Megace intermittently for many years and previously has not had any issues.  She notes that a lot of her issues seem to be the last few months or so since starting on other medications and more specifically since she stopped her Lexapro.  Below are some of the current complaints:     Notes that her leg would just "give out" and she would fall.  Also noted migraines x 2 weeks- seen by PCP and tried OTC meds with no improvement.  Also notes pelvic pain and pain with intercourse.    -Also notes "weird" pain in her back, urine seems dark and has an odor.  Pain is so bad that she will "lose feeling" and travel down her back and to her legs.    She also notes stomach pain- typically early in the morning and will wake her up from sleep.  Feels like someone is "stabbing her in the gut."  She will go void and it seems like the will help a little bit.  Also feels like the pain may be worse   Currently with Megace she does not have a period.  Prior to Megace noted considerable heavy irregular periods  No LMP recorded. (Menstrual status: Irregular Periods).    Review of Systems:   Pertinent items are noted in HPI Denies fever/chills, dizziness, headaches, visual disturbances, fatigue, shortness of breath, chest pain, no vomiting. Pertinent History Reviewed:   Past Surgical History:  Procedure Laterality Date   CARPAL TUNNEL RELEASE  Right 06/22/2022   Procedure: CARPAL TUNNEL RELEASE;  Surgeon: Oliver Barre, MD;  Location: [facility];  Service: Orthopedics;  Laterality: Right;   CHOLECYSTECTOMY N/A 04/06/2014   Procedure: LAPAROSCOPIC CHOLECYSTECTOMY;  Surgeon: Dalia Heading, MD;  Location: [facility];  Service: General;  Laterality: N/A;   CYSTOSCOPY W/ URETERAL STENT PLACEMENT     EXCISION OF SKIN TAG Right 02/28/2019   Procedure: EXCISION OF GLUTEAL SKIN TAG;  Surgeon: Tilda Burrow, MD;  Location: [facility];  Service: Gynecology;  Laterality: Right;   LAPAROSCOPIC BILATERAL SALPINGECTOMY Bilateral 02/28/2019   Procedure: LAPAROSCOPIC BILATERAL SALPINGECTOMY;  Surgeon: Tilda Burrow, MD;  Location: [facility];  Service: Gynecology;  Laterality: Bilateral;   NORPLANT REMOVAL Left 02/28/2019   Procedure: REMOVAL OF NORPLANT;  Surgeon: Tilda Burrow, MD;  Location: [facility];  Service: Gynecology;  Laterality: Left;   TONSILECTOMY, ADENOIDECTOMY, BILATERAL MYRINGOTOMY AND TUBES     age 10   TONSILLECTOMY     TUBAL LIGATION      Past Medical History:  Diagnosis Date   Asthma    Carpal tunnel syndrome    Gall stones    GERD (gastroesophageal reflux disease)    Hx of constipation    Migraines    Nexplanon insertion 10/11/2013   Inserted left arm 10/11/13 remove 5/27 /18   Reviewed problem list, medications  and allergies. Physical Assessment:   Vitals:   12/29/22 0856  BP: 133/89  Pulse: (!) 52  Weight: 220 lb 3.2 oz (99.9 kg)  Height: 5\' 1"  (1.549 m)  Body mass index is 41.61 kg/m.       Physical Examination:   General appearance: alert, well appearing, and in no distress  Psych: mood appropriate, normal affect  Skin: warm & dry   Cardiovascular: normal heart rate noted  Respiratory: normal respiratory effort, no distress  Abdomen: obese, soft, no reproducible pain, no rebound, no guarding  Pelvic: examination not indicated  Extremities: no edema   Chaperone: N/A    Assessment & Plan:  1) Pelvic/abdominal  pain -Discussed with patient that pain may or may not be gynecologic related.  While the Megace has improved her bleeding, it is still possible to have menstrual cramping.  Would advise over-the-counter Tylenol or ibuprofen as needed -prior US 04/2022 so no acute gyn abnormalities  2) ?Megace side effects, HMB -It is unlikely that her symptoms are due to the Megace as she has been on this medication for many years; however, there is always a small chance that it may be the medication -If she desires she could stop the medication, however we would need to discuss alternative option to regulate her period -Started to review options including other LARCs, pill or even surgical intervention such as endometrial ablation or hysterectomy She reports with Nexplanon-device migrated and had difficulty with removal -after much discussion, pt is not sure what she wants to do  -given list of options and pt will let us know her plan   No orders of the defined types were placed in this encounter.   Return if symptoms worsen or fail to improve.   Myna Hidalgo, DO Attending Obstetrician & Gynecologist, [facility] for [employer], [facility]

## 2022-12-30 ENCOUNTER — Encounter: Payer: Self-pay | Admitting: Family Medicine

## 2022-12-30 ENCOUNTER — Ambulatory Visit: Payer: Medicaid Other | Admitting: Family Medicine

## 2022-12-30 VITALS — BP 129/79 | HR 89 | Resp 16 | Ht 61.0 in | Wt 221.4 lb

## 2022-12-30 DIAGNOSIS — M5432 Sciatica, left side: Secondary | ICD-10-CM | POA: Diagnosis not present

## 2022-12-30 DIAGNOSIS — M5416 Radiculopathy, lumbar region: Secondary | ICD-10-CM | POA: Diagnosis not present

## 2022-12-30 DIAGNOSIS — J4530 Mild persistent asthma, uncomplicated: Secondary | ICD-10-CM | POA: Diagnosis not present

## 2022-12-30 DIAGNOSIS — F419 Anxiety disorder, unspecified: Secondary | ICD-10-CM

## 2022-12-30 DIAGNOSIS — J4540 Moderate persistent asthma, uncomplicated: Secondary | ICD-10-CM

## 2022-12-30 MED ORDER — METHYLPREDNISOLONE 4 MG PO TBPK: ORAL_TABLET | ORAL | 0 refills | Status: DC

## 2022-12-30 MED ORDER — SERTRALINE HCL 25 MG PO TABS
25.0000 mg | ORAL_TABLET | Freq: Every day | ORAL | 3 refills | Status: DC
Start: 2022-12-30 — End: 2023-02-09

## 2022-12-30 MED ORDER — BUDESONIDE-FORMOTEROL FUMARATE 160-4.5 MCG/ACT IN AERO
2.0000 | INHALATION_SPRAY | Freq: Two times a day (BID) | RESPIRATORY_TRACT | 1 refills | Status: AC
Start: 2022-12-30 — End: ?

## 2022-12-30 MED ORDER — CYCLOBENZAPRINE HCL 5 MG PO TABS: 5.0000 mg | ORAL_TABLET | Freq: Every evening | ORAL | 1 refills | Status: DC | PRN

## 2022-12-30 NOTE — Patient Instructions (Addendum)
I appreciate the opportunity to provide care to you today!    Follow up:  6 weeks for anxiety  Asthma Please start using the Symbicort 160/4.5 inhaler, taking 2 puffs daily as part of your maintenance regimen. After using Symbicort, rinse your mouth with water and spit it out--do not swallow--to help prevent the development of oral thrush. Your asthma is considered poorly controlled if you need to use your rescue inhaler more than twice a week.  Sciatica Please begin the Medrol dose pack to address inflammation. Additionally, take Flexeril 5 mg at bedtime and Tylenol as needed for pain relief. Avoid using NSAID products while on the Medrol dose pack.  Lumbar Pain An X-ray has been ordered to identify the underlying pathology that may be contributing to your back pain.  Anxiety Please taper off Lexapro over the course of one week while beginning Zoloft 25 mg daily. It is important to monitor for symptoms of serotonin syndrome during this treatment transition.  Symptoms of Serotonin Syndrome may include:  Mental Status Changes: Agitation, confusion, hallucination, or delirium. Autonomic Instability: Rapid heart rate, high blood pressure, excessive sweating, or fever. Neuromuscular Abnormalities: Tremors, muscle rigidity, incoordination, or hyperreflexia (exaggerated reflexes). Gastrointestinal Symptoms: Nausea, vomiting, or diarrhea.  Nonpharmacologic management of anxiety and depression  Mindfulness and Meditation Practices like mindfulness meditation can help reduce symptoms by promoting relaxation and present-moment awareness.  Exercise  Regular physical activity has been shown to improve mood and reduce anxiety through the release of endorphins and other neurochemicals.  Healthy Diet Eating a balanced diet rich in fruits, vegetables, whole grains, and lean proteins can support overall mental health.  Sleep Hygiene  Establishing a regular sleep routine and ensuring good sleep  quality can significantly impact mood and anxiety levels.  Stress Management Techniques Activities such as yoga, tai chi, and deep breathing exercises can help manage stress.  Social Support Maintaining strong relationships and seeking support from friends, family, or support groups can provide emotional comfort and reduce feelings of isolation.  Lifestyle Modifications Reducing alcohol and caffeine intake, quitting smoking, and avoiding recreational drugs can improve symptoms.  Art and Music Therapy Engaging in creative activities like painting, drawing, or playing music can be therapeutic and help express emotions.  Light Therapy Particularly useful for seasonal affective disorder (SAD), exposure to bright light can help regulate mood. .    Please continue to a heart-healthy diet and increase your physical activities. Try to exercise for at least five days a week.    It was a pleasure to see you and I look forward to continuing to work together on your health and well-being. Please do not hesitate to call the office if you need care or have questions about your care.  In case of emergency, please visit the Emergency Department for urgent care, or contact our clinic at [phone] to schedule an appointment. We're here to help you!   Have a wonderful day and week. With Gratitude, Gilmore Laroche MSN, FNP-BC

## 2022-12-30 NOTE — Progress Notes (Signed)
Established Patient Office Visit  Subjective:  Patient ID: Patricia Williamson, female    DOB: 12-01-1985  Age: 37 y.o. MRN: [id number]  CC:  Chief Complaint  Patient presents with   Follow-up    6 week follow up from Lexapro increase. States it is not working.    Night Sweats    Started after she increased the lexapro    Leg Swelling    Legs have been swelling and are very sore    Back Pain    Low back stabbing pain into her hips and down the back of her legs. States her whole back goes numb as well    HPI Patricia Williamson is a 37 y.o. female with past medical history of anxiety, and vit d deficiency  presents for f/u of  chronic medical conditions. For the details of today's visit, please refer to the assessment and plan.     Past Medical History:  Diagnosis Date   Asthma    Carpal tunnel syndrome    Gall stones    GERD (gastroesophageal reflux disease)    Hx of constipation    Migraines    Nexplanon insertion 10/11/2013   Inserted left arm 10/11/13 remove 5/27 /18    Past Surgical History:  Procedure Laterality Date   CARPAL TUNNEL RELEASE Right 06/22/2022   Procedure: CARPAL TUNNEL RELEASE;  Surgeon: Oliver Barre, MD;  Location: [facility];  Service: Orthopedics;  Laterality: Right;   CHOLECYSTECTOMY N/A 04/06/2014   Procedure: LAPAROSCOPIC CHOLECYSTECTOMY;  Surgeon: Dalia Heading, MD;  Location: [facility];  Service: General;  Laterality: N/A;   CYSTOSCOPY W/ URETERAL STENT PLACEMENT     EXCISION OF SKIN TAG Right 02/28/2019   Procedure: EXCISION OF GLUTEAL SKIN TAG;  Surgeon: Tilda Burrow, MD;  Location: [facility];  Service: Gynecology;  Laterality: Right;   LAPAROSCOPIC BILATERAL SALPINGECTOMY Bilateral 02/28/2019   Procedure: LAPAROSCOPIC BILATERAL SALPINGECTOMY;  Surgeon: Tilda Burrow, MD;  Location: [facility];  Service: Gynecology;  Laterality: Bilateral;   NORPLANT REMOVAL Left 02/28/2019   Procedure: REMOVAL OF NORPLANT;  Surgeon: Tilda Burrow, MD;  Location: [facility];  Service: Gynecology;  Laterality: Left;   TONSILECTOMY, ADENOIDECTOMY, BILATERAL MYRINGOTOMY AND TUBES     age 89   TONSILLECTOMY     TUBAL LIGATION      Family History  Problem Relation Age of Onset   Hypertension Mother    Diabetes Mother    Hyperlipidemia Mother    Breast cancer Mother 6   Cirrhosis Father        liver transplant and subsequent rejection   Hypertension Brother    Diabetes Maternal Grandmother    Heart disease Maternal Grandmother    Other Paternal Aunt        benign breast lump   Colon cancer Neg Hx    Colon polyps Neg Hx     Social History   Socioeconomic History   Marital status: Married    Spouse name: Not on file   Number of children: 2   Years of education: Not on file   Highest education level: 12th grade  Occupational History   Not on file  Tobacco Use   Smoking status: Every Day    Current packs/day: 1.00    Average packs/day: 1 pack/day for 11.0 years (11.0 ttl pk-yrs)    Types: Cigarettes    Passive exposure: Current   Smokeless tobacco: Never  Vaping Use   Vaping status: Never Used  Substance and Sexual Activity   Alcohol use: No   Drug use: No   Sexual activity: Yes    Birth control/protection: Surgical    Comment: tubal  Other Topics Concern   Not on file  Social History Narrative   Not on file   Social Determinants of Health   Financial Resource Strain: Low Risk  (09/10/2022)   Overall Financial Resource Strain (CARDIA)    Difficulty of Paying Living Expenses: Not hard at all  Food Insecurity: No Food Insecurity (09/10/2022)   Hunger Vital Sign    Worried About Running Out of Food in the Last Year: Never true    Ran Out of Food in the Last Year: Never true  Transportation Needs: No Transportation Needs (09/10/2022)   PRAPARE - Administrator, Civil Service (Medical): No    Lack of Transportation (Non-Medical): No  Physical Activity: Unknown (09/10/2022)   Exercise Vital Sign    Days of Exercise per  Week: 0 days    Minutes of Exercise per Session: Not on file  Recent Concern: Physical Activity - Inactive (09/10/2022)   Exercise Vital Sign    Days of Exercise per Week: 0 days    Minutes of Exercise per Session: 40 min  Stress: Patient Declined (09/10/2022)   [employer] of Occupational Health - Occupational Stress Questionnaire    Feeling of Stress : Patient declined  Social Connections: Moderately Isolated (09/10/2022)   Social Connection and Isolation Panel [NHANES]    Frequency of Communication with Friends and Family: More than three times a week    Frequency of Social Gatherings with Friends and Family: More than three times a week    Attends Religious Services: Never    Database administrator or Organizations: No    Attends Engineer, structural: Not on file    Marital Status: Married  Catering manager Violence: Not At Risk (12/12/2021)   Humiliation, Afraid, Rape, and Kick questionnaire    Fear of Current or Ex-Partner: No    Emotionally Abused: No    Physically Abused: No    Sexually Abused: No    Outpatient Medications Prior to Visit  Medication Sig Dispense Refill   albuterol (VENTOLIN HFA) 108 (90 Base) MCG/ACT inhaler Inhale 1-2 puffs into the lungs every 6 (six) hours as needed for wheezing or shortness of breath. 8 g 3   hydrOXYzine (VISTARIL) 25 MG capsule Take 1 capsule (25 mg total) by mouth every 8 (eight) hours as needed. 30 capsule 0   linaclotide (LINZESS) 290 MCG CAPS capsule Take 1 capsule (290 mcg total) by mouth daily before breakfast. 30 capsule 5   megestrol (MEGACE) 40 MG tablet Take 40 mg by mouth 2 (two) times daily.     ondansetron (ZOFRAN) 4 MG tablet Take 4 mg by mouth every 8 (eight) hours as needed for nausea or vomiting.     pantoprazole (PROTONIX) 40 MG tablet Take 1 tablet (40 mg total) by mouth 2 (two) times daily before a meal. (Patient taking differently: Take 40 mg by mouth daily.) 60 tablet 3   rosuvastatin (CRESTOR) 10 MG  tablet Take 1 tablet (10 mg total) by mouth daily. 90 tablet 1   SUMAtriptan (IMITREX) 25 MG tablet Take 1 tablet (25 mg total) by mouth every 2 (two) hours as needed for migraine. May repeat in 2 hours if headache persists or recurs (max 200 mg per 24-hour period) 30 tablet 0   Vitamin D, Ergocalciferol, (DRISDOL) 1.25 MG (  50000 UNIT) CAPS capsule Take 1 capsule (50,000 Units total) by mouth every 7 (seven) days. 20 capsule 1   escitalopram (LEXAPRO) 20 MG tablet Take 1 tablet (20 mg total) by mouth daily. 30 tablet 1   No facility-administered medications prior to visit.    Allergies  Allergen Reactions   Vinegar [Acetic Acid] Hives and Itching   Sulfa Antibiotics Rash    ROS Review of Systems  Constitutional:  Negative for chills and fever.  Eyes:  Negative for visual disturbance.  Respiratory:  Negative for chest tightness and shortness of breath.   Musculoskeletal:  Positive for back pain.  Neurological:  Negative for dizziness and headaches.      Objective:    Physical Exam HENT:     Head: Normocephalic.     Mouth/Throat:     Mouth: Mucous membranes are moist.  Cardiovascular:     Rate and Rhythm: Normal rate.     Heart sounds: Normal heart sounds.  Pulmonary:     Effort: Pulmonary effort is normal.     Breath sounds: Wheezing present.  Musculoskeletal:     Lumbar back: Tenderness present.  Neurological:     Mental Status: She is alert.     BP 129/79   Pulse 89   Resp 16   Ht 5\' 1"  (1.549 m)   Wt 221 lb 6.4 oz (100.4 kg)   SpO2 96%   BMI 41.83 kg/m  Wt Readings from Last 3 Encounters:  01/02/23 221 lb (100.2 kg)  12/30/22 221 lb 6.4 oz (100.4 kg)  12/29/22 220 lb 3.2 oz (99.9 kg)    Lab Results  Component Value Date   TSH 1.590 05/05/2022   Lab Results  Component Value Date   WBC 8.9 08/06/2022   HGB 16.1 (H) 08/06/2022   HCT 49.4 (H) 08/06/2022   MCV 92 08/06/2022   PLT 287 08/06/2022   Lab Results  Component Value Date   NA 143  08/06/2022   K 4.0 08/06/2022   CO2 18 (L) 08/06/2022   GLUCOSE 88 08/06/2022   BUN 7 08/06/2022   CREATININE 0.68 08/06/2022   BILITOT <0.2 08/06/2022   ALKPHOS 57 08/06/2022   AST 12 08/06/2022   ALT 16 08/06/2022   PROT 7.1 08/06/2022   ALBUMIN 4.4 08/06/2022   CALCIUM 9.5 08/06/2022   ANIONGAP 7 10/01/2021   EGFR 117 08/06/2022   Lab Results  Component Value Date   CHOL 246 (H) 08/06/2022   Lab Results  Component Value Date   HDL 24 (L) 08/06/2022   Lab Results  Component Value Date   LDLCALC 169 (H) 08/06/2022   Lab Results  Component Value Date   TRIG 276 (H) 08/06/2022   Lab Results  Component Value Date   CHOLHDL 10.3 (H) 08/06/2022   Lab Results  Component Value Date   HGBA1C 5.9 (H) 08/06/2022      Assessment & Plan:  Mild persistent asthma without complication Assessment & Plan: Reports using her rescue inhaler more than twice weekly Will start the patient on a maintenance inhaler Encouraged to start using Symbicort 160/4.5 inhaler, taking 2 puffs daily  Advised to rinse her mouth with water and spit it out--do not swallow--to help prevent the development of oral thrush.    Orders: -     Budesonide-Formoterol Fumarate; Inhale 2 puffs into the lungs 2 (two) times daily.  Dispense: 10.2 g; Refill: 1  Sciatica of left side Assessment & Plan: C/o numbness and tingling in her  left lower extremity  No weakness or bowel/bladder incontinence Will treat with medrol dose pack, flexeril 5 mg at bedtime and tylenol as needed     Orders: -     methylPREDNISolone; Take as the package instructed  Dispense: 1 each; Refill: 0 -     Cyclobenzaprine HCl; Take 1 tablet (5 mg total) by mouth at bedtime as needed for muscle spasms.  Dispense: 30 tablet; Refill: 1  Lumbar radicular pain -     DG Lumbar Spine Complete  Anxiety Assessment & Plan: Encouraged to taper off Lexapro over the course of one week while beginning Zoloft 25 mg daily.  Advised to  monitor for symptoms of serotonin syndrome during this treatment transition.  Symptoms of Serotonin Syndrome may include:  Mental Status Changes: Agitation, confusion, hallucination, or delirium. Autonomic Instability: Rapid heart rate, high blood pressure, excessive sweating, or fever. Neuromuscular Abnormalities: Tremors, muscle rigidity, incoordination, or hyperreflexia (exaggerated reflexes). Gastrointestinal Symptoms: Nausea, vomiting, or diarrhea.   Orders: -     Sertraline HCl; Take 1 tablet (25 mg total) by mouth daily.  Dispense: 30 tablet; Refill: 3  Note: This chart has been completed using Engineer, civil (consulting) software, and while attempts have been made to ensure accuracy, certain words and phrases may not be transcribed as intended.    Follow-up: Return in about 6 weeks (around 02/10/2023) for anxiety and depression.   Gilmore Laroche, FNP

## 2022-12-31 ENCOUNTER — Ambulatory Visit (HOSPITAL_COMMUNITY)
Admission: RE | Admit: 2022-12-31 | Discharge: 2022-12-31 | Disposition: A | Discharge status: Home / Self Care | Payer: Medicaid Other | Source: Ambulatory Visit | Attending: Family Medicine | Admitting: Family Medicine

## 2022-12-31 DIAGNOSIS — M5416 Radiculopathy, lumbar region: Secondary | ICD-10-CM | POA: Diagnosis present

## 2023-01-02 ENCOUNTER — Ambulatory Visit: Payer: Medicaid Other

## 2023-01-02 ENCOUNTER — Ambulatory Visit
Admission: EM | Admit: 2023-01-02 | Discharge: 2023-01-02 | Disposition: A | Discharge status: Home / Self Care | Payer: Medicaid Other | Attending: Nurse Practitioner | Admitting: Nurse Practitioner

## 2023-01-02 DIAGNOSIS — M79672 Pain in left foot: Secondary | ICD-10-CM

## 2023-01-02 DIAGNOSIS — M7662 Achilles tendinitis, left leg: Secondary | ICD-10-CM

## 2023-01-02 DIAGNOSIS — M7732 Calcaneal spur, left foot: Secondary | ICD-10-CM

## 2023-01-02 NOTE — Discharge Instructions (Addendum)
The x-ray of the left foot is negative for fracture or dislocation.  It does show that you have a heel spur, which could also be contributing to your pain. Wear the postop shoe with prolonged or strenuous activity. RICE therapy, rest, ice, compression, and elevation.  Apply ice for 20 minutes, remove for 1 hour, repeat is much as possible over the next 48 hours. May take over-the-counter Tylenol or ibuprofen as needed for pain or discomfort. As discussed, if symptoms or not improving over the next 2 to 3 weeks, recommend following up with your orthopedic for further evaluation. Follow-up as needed.

## 2023-01-02 NOTE — ED Provider Notes (Signed)
[facility]    CSN: [id number] Arrival date & time: 01/02/23  1041      History   Chief Complaint Chief Complaint  Patient presents with   Foot Pain    Left heel     HPI Patricia Williamson is a 37 y.o. female.   The history is provided by the patient.   Patient presents for complaints of pain in the left foot.  Patient states that she fell on a rug on her front porch 1 day ago.  She states when she fell, she landed first on the left foot, and then rolled forward.  She states since that time, she has had pain in the heel of the left foot.  She states the pain radiates up the back of the left leg just below the thigh.  She also complains of swelling to the left foot just below the ankle bone.  She denies numbness, tingling, or inability to bear weight.  Patient states that the pain feels like it is "constant and throbbing".  She states that standing and walking make the pain worse.  She has not taken any medication for her symptoms.  Past Medical History:  Diagnosis Date   Asthma    Carpal tunnel syndrome    Gall stones    GERD (gastroesophageal reflux disease)    Hx of constipation    Migraines    Nexplanon insertion 10/11/2013   Inserted left arm 10/11/13 remove 5/27 /18    Patient Active Problem List   Diagnosis Date Noted   Anxiety 09/10/2022   Knee instability, right 08/06/2022   Vitamin D deficiency 08/06/2022   Rectal bleeding 07/23/2022   Constipation 07/23/2022   GERD (gastroesophageal reflux disease) 07/23/2022   Sore throat 06/07/2022   Dysphagia 06/05/2022   Acute cervical adenitis 06/05/2022   Carpal tunnel syndrome, bilateral 05/05/2022   Chronic migraine with aura, not intractable, with status migrainosus 05/05/2022   Menorrhagia with regular cycle 04/22/2022   Pelvic pain 04/22/2022   Routine Papanicolaou smear 04/22/2022   Bloody stools 07/25/2015   Encounter for female sterilization procedure 10/11/2013   Abnormal uterine bleeding (AUB)  08/03/2013   Asthma exacerbation 02/22/2013   Bronchitis with asthma, acute 02/22/2013    Past Surgical History:  Procedure Laterality Date   CARPAL TUNNEL RELEASE Right 06/22/2022   Procedure: CARPAL TUNNEL RELEASE;  Surgeon: Oliver Barre, MD;  Location: [facility];  Service: Orthopedics;  Laterality: Right;   CHOLECYSTECTOMY N/A 04/06/2014   Procedure: LAPAROSCOPIC CHOLECYSTECTOMY;  Surgeon: Dalia Heading, MD;  Location: [facility];  Service: General;  Laterality: N/A;   CYSTOSCOPY W/ URETERAL STENT PLACEMENT     EXCISION OF SKIN TAG Right 02/28/2019   Procedure: EXCISION OF GLUTEAL SKIN TAG;  Surgeon: Tilda Burrow, MD;  Location: [facility];  Service: Gynecology;  Laterality: Right;   LAPAROSCOPIC BILATERAL SALPINGECTOMY Bilateral 02/28/2019   Procedure: LAPAROSCOPIC BILATERAL SALPINGECTOMY;  Surgeon: Tilda Burrow, MD;  Location: [facility];  Service: Gynecology;  Laterality: Bilateral;   NORPLANT REMOVAL Left 02/28/2019   Procedure: REMOVAL OF NORPLANT;  Surgeon: Tilda Burrow, MD;  Location: [facility];  Service: Gynecology;  Laterality: Left;   TONSILECTOMY, ADENOIDECTOMY, BILATERAL MYRINGOTOMY AND TUBES     age 48   TONSILLECTOMY     TUBAL LIGATION      OB History     Gravida  2   Para  2   Term  1   Preterm  1   AB  Living  2      SAB      IAB      Ectopic      Multiple      Live Births  2            Home Medications    Prior to Admission medications   Medication Sig Start Date End Date Taking? Authorizing Provider  albuterol (VENTOLIN HFA) 108 (90 Base) MCG/ACT inhaler Inhale 1-2 puffs into the lungs every 6 (six) hours as needed for wheezing or shortness of breath. 09/10/22   Del Newman Nip, Tenna Child, FNP  budesonide-formoterol (SYMBICORT) 160-4.5 MCG/ACT inhaler Inhale 2 puffs into the lungs 2 (two) times daily. 12/30/22   Gilmore Laroche, FNP  cyclobenzaprine (FLEXERIL) 5 MG tablet Take 1 tablet (5 mg total) by mouth at bedtime as needed for muscle  spasms. 12/30/22   Gilmore Laroche, FNP  hydrOXYzine (VISTARIL) 25 MG capsule Take 1 capsule (25 mg total) by mouth every 8 (eight) hours as needed. 11/18/22   Gilmore Laroche, FNP  linaclotide [facility]) 290 MCG CAPS capsule Take 1 capsule (290 mcg total) by mouth daily before breakfast. 07/29/22   Letta Median, PA-C  megestrol (MEGACE) 40 MG tablet Take 40 mg by mouth 2 (two) times daily.    [provider]  methylPREDNISolone (MEDROL DOSEPAK) 4 MG TBPK tablet Take as the package instructed 12/30/22   Gilmore Laroche, FNP  ondansetron (ZOFRAN) 4 MG tablet Take 4 mg by mouth every 8 (eight) hours as needed for nausea or vomiting.    [provider]  pantoprazole (PROTONIX) 40 MG tablet Take 1 tablet (40 mg total) by mouth 2 (two) times daily before a meal. Patient taking differently: Take 40 mg by mouth daily. 07/23/22   Gelene Mink, NP  rosuvastatin (CRESTOR) 10 MG tablet Take 1 tablet (10 mg total) by mouth daily. 08/10/22   Gilmore Laroche, FNP  sertraline (ZOLOFT) 25 MG tablet Take 1 tablet (25 mg total) by mouth daily. 12/30/22   Gilmore Laroche, FNP  SUMAtriptan (IMITREX) 25 MG tablet Take 1 tablet (25 mg total) by mouth every 2 (two) hours as needed for migraine. May repeat in 2 hours if headache persists or recurs (max 200 mg per 24-hour period) 07/17/22   Gilmore Laroche, FNP  Vitamin D, Ergocalciferol, (DRISDOL) 1.25 MG (50000 UNIT) CAPS capsule Take 1 capsule (50,000 Units total) by mouth every 7 (seven) days. 08/06/22   Gilmore Laroche, FNP    Family History Family History  Problem Relation Age of Onset   Hypertension Mother    Diabetes Mother    Hyperlipidemia Mother    Breast cancer Mother 32   Cirrhosis Father        liver transplant and subsequent rejection   Hypertension Brother    Diabetes Maternal Grandmother    Heart disease Maternal Grandmother    Other Paternal Aunt        benign breast lump   Colon cancer Neg Hx    Colon polyps Neg Hx     Social  History Social History   Tobacco Use   Smoking status: Every Day    Current packs/day: 1.00    Average packs/day: 1 pack/day for 11.0 years (11.0 ttl pk-yrs)    Types: Cigarettes    Passive exposure: Current   Smokeless tobacco: Never  Vaping Use   Vaping status: Never Used  Substance Use Topics   Alcohol use: No   Drug use: No     Allergies  Vinegar [acetic acid] and Sulfa antibiotics   Review of Systems Review of Systems Per HPI  Physical Exam Triage Vital Signs ED Triage Vitals  Encounter Vitals Group     BP 01/02/23 1045 132/87     Systolic BP Percentile --      Diastolic BP Percentile --      Pulse Rate 01/02/23 1045 83     Resp 01/02/23 1045 16     Temp 01/02/23 1045 97.9 F (36.6 C)     Temp Source 01/02/23 1045 Oral     SpO2 01/02/23 1045 95 %     Weight 01/02/23 1050 221 lb (100.2 kg)     Height 01/02/23 1050 5\' 1"  (1.549 m)     Head Circumference --      Peak Flow --      Pain Score 01/02/23 1050 5     Pain Loc --      Pain Education --      Exclude from Growth Chart --    No data found.  Updated Vital Signs BP 132/87 (BP Location: Left Arm)   Pulse 85   Temp 97.9 F (36.6 C) (Oral)   Resp 16   Ht 5\' 1"  (1.549 m)   Wt 221 lb (100.2 kg)   SpO2 95%   BMI 41.76 kg/m   Visual Acuity Right Eye Distance:   Left Eye Distance:   Bilateral Distance:    Right Eye Near:   Left Eye Near:    Bilateral Near:     Physical Exam Vitals and nursing note reviewed.  Constitutional:      General: She is not in acute distress.    Appearance: Normal appearance.  Eyes:     Extraocular Movements: Extraocular movements intact.     Pupils: Pupils are equal, round, and reactive to light.  Pulmonary:     Effort: Pulmonary effort is normal.  Musculoskeletal:     Cervical back: Normal range of motion.     Left foot: Normal range of motion and normal capillary refill. Swelling (Swelling noted to the medial aspect of the left foot immediate below the  medial malleolus) and tenderness (Left calcaneus and left Achilles tendon) present. No deformity. Normal pulse.  Lymphadenopathy:     Cervical: No cervical adenopathy.  Skin:    General: Skin is warm and dry.  Neurological:     General: No focal deficit present.     Mental Status: She is alert and oriented to person, place, and time.  Psychiatric:        Mood and Affect: Mood normal.        Behavior: Behavior normal.      UC Treatments / Results  Labs (all labs ordered are listed, but only abnormal results are displayed) Labs Reviewed - No data to display  EKG   Radiology DG Foot Complete Left  Result Date: 01/02/2023 CLINICAL DATA:  Fall yesterday with left heel pain. EXAM: LEFT FOOT - COMPLETE 3+ VIEW COMPARISON:  09/25/2003 FINDINGS: There is no evidence of fracture or dislocation. There is no evidence of arthropathy or other focal bone abnormality. Small inferior calcaneal spur. Soft tissues are unremarkable. IMPRESSION: 1. No acute findings. 2. Small inferior calcaneal spur. Electronically Signed   By: Elberta Fortis M.D.   On: 01/02/2023 11:20    Procedures Procedures (including critical care time)  Medications Ordered in UC Medications - No data to display  Initial Impression / Assessment and Plan / UC Course  I have  reviewed the triage vital signs and the nursing notes.  Pertinent labs & imaging results that were available during my care of the patient were reviewed by me and considered in my medical decision making (see chart for details).  The patient is well-appearing, she is in no acute distress, vital signs are stable.  X-ray of the left foot is negative for fracture or dislocation.  X-ray does show a small inferior calcaneal spur.  This most likely is exacerbating her pain.  Postop shoe was provided to provide patient with good support.  Supportive care recommendations were provided and discussed with the patient to include RICE therapy, over-the-counter  analgesics, and gentle range of motion exercises.  Patient was advised if symptoms are not improving over the next 2 to 3 weeks, recommend that she follow-up with orthopedics for further evaluation.  Patient is in agreement with this plan of care and verbalizes understanding.  All questions were answered.  Patient stable for discharge.   Final Clinical Impressions(s) / UC Diagnoses   Final diagnoses:  None   Discharge Instructions   None    ED Prescriptions   None    PDMP not reviewed this encounter.   Abran Cantor, NP 01/02/23 1134

## 2023-01-02 NOTE — ED Triage Notes (Addendum)
Patient reports slipping on a rug yesterday. Reports pain in left foot, radiating up leg. Pain is in heel area, describes as constant and throbbing. Pt states it hurts to stand and no treatment used.

## 2023-01-03 DIAGNOSIS — J4530 Mild persistent asthma, uncomplicated: Secondary | ICD-10-CM | POA: Insufficient documentation

## 2023-01-03 DIAGNOSIS — M5432 Sciatica, left side: Secondary | ICD-10-CM | POA: Insufficient documentation

## 2023-01-03 NOTE — Assessment & Plan Note (Signed)
Encouraged to taper off Lexapro over the course of one week while beginning Zoloft 25 mg daily.  Advised to monitor for symptoms of serotonin syndrome during this treatment transition.  Symptoms of Serotonin Syndrome may include:  Mental Status Changes: Agitation, confusion, hallucination, or delirium. Autonomic Instability: Rapid heart rate, high blood pressure, excessive sweating, or fever. Neuromuscular Abnormalities: Tremors, muscle rigidity, incoordination, or hyperreflexia (exaggerated reflexes). Gastrointestinal Symptoms: Nausea, vomiting, or diarrhea.

## 2023-01-03 NOTE — Assessment & Plan Note (Addendum)
Reports using her rescue inhaler more than twice weekly Will start the patient on a maintenance inhaler Encouraged to start using Symbicort 160/4.5 inhaler, taking 2 puffs daily  Advised to rinse her mouth with water and spit it out--do not swallow--to help prevent the development of oral thrush.

## 2023-01-03 NOTE — Assessment & Plan Note (Signed)
C/o numbness and tingling in her left lower extremity  No weakness or bowel/bladder incontinence Will treat with medrol dose pack, flexeril 5 mg at bedtime and tylenol as needed

## 2023-01-15 ENCOUNTER — Encounter: Payer: Self-pay | Admitting: Orthopedic Surgery

## 2023-01-15 ENCOUNTER — Ambulatory Visit: Payer: Medicaid Other | Admitting: Orthopedic Surgery

## 2023-01-15 VITALS — BP 132/84 | HR 82 | Ht 61.0 in | Wt 224.0 lb

## 2023-01-15 DIAGNOSIS — S93402A Sprain of unspecified ligament of left ankle, initial encounter: Secondary | ICD-10-CM | POA: Diagnosis not present

## 2023-01-15 NOTE — Progress Notes (Signed)
Orthopaedic Clinic Return  Assessment: Patricia Williamson is a 37 y.o. female with the following: Left ankle sprain   Plan: Patricia Williamson stumble and fell recently.  She had some pain and swelling in the left ankle.  Radiographs at the urgent care center demonstrated heel spurs, but no acute injury.  Based on the description of her injury, as well as her recovery, I feel as though she is most likely sprained the left ankle.  On physical exam today, she has minimal tenderness to palpation.  She is doing pretty good.  She is ambulating in a regular shoe.  Anticipate that the swelling she is experiencing will gradually resolve.  Elevate the leg when it is swollen.  Medications as needed.  I gave her some home exercises to initiate.  If she continues to have issues, I would send her to therapy.   Follow-up: Return if symptoms worsen or fail to improve.   Subjective:  Chief Complaint  Patient presents with   Foot Pain    L/ DOI 01/01/23 left knee gave out and fell on front porch and landed on top of foot. Urgent care gave shoe to wear. Foot stays swollen and it hurts, the swelling is going up leg. Feels tight all the time. Has a sharp pain, it felt like something broke when I fell.    History of Present Illness: Patricia Williamson is a 37 y.o. female who returns to clinic for evaluation of left foot pain.  Patient is well-known to my clinic.  Approximate 2 weeks ago, she stumbled and fell.  She landed awkwardly on her left ankle.  She had swelling and pain.  She presented to urgent care center.  Radiographs demonstrate heel spurs.  She was given a boot.  She was told the pain was related to the spurs.  She continues to have some discomfort.  She is no longer wearing a walking boot.  She is not taking medicines on a consistent basis.  Pain is primarily in the medial aspect of the left ankle.  Review of Systems: No fevers or chills No numbness or tingling No chest pain No shortness of breath No bowel  or bladder dysfunction No GI distress No headaches   Objective: BP 132/84   Pulse 82   Ht 5\' 1"  (1.549 m)   Wt 224 lb (101.6 kg)   BMI 42.32 kg/m   Physical Exam:  Evaluation of left foot and ankle demonstrates mild diffuse swelling.  No bruising.  Tenderness over the medial ankle.  No tenderness over the ATFL.  No pain with inversion of the ankle.  Mild discomfort in the medial ankle with eversion of the ankle.  Toes are warm and well-perfused.  Sensation intact throughout the left foot.  IMAGING:  X-rays were available from the urgent care visit.  No acute fractures.  Small heel spurs.   Patricia Barre, MD 01/15/2023 11:37 AM

## 2023-01-15 NOTE — Patient Instructions (Signed)

## 2023-02-09 ENCOUNTER — Encounter: Payer: Self-pay | Admitting: Gastroenterology

## 2023-02-09 ENCOUNTER — Ambulatory Visit: Payer: Medicaid Other | Admitting: Gastroenterology

## 2023-02-09 VITALS — BP 134/87 | HR 79 | Temp 98.8°F | Ht 61.0 in | Wt 226.6 lb

## 2023-02-09 DIAGNOSIS — K5900 Constipation, unspecified: Secondary | ICD-10-CM | POA: Diagnosis not present

## 2023-02-09 DIAGNOSIS — R140 Abdominal distension (gaseous): Secondary | ICD-10-CM

## 2023-02-09 DIAGNOSIS — K625 Hemorrhage of anus and rectum: Secondary | ICD-10-CM

## 2023-02-09 DIAGNOSIS — K219 Gastro-esophageal reflux disease without esophagitis: Secondary | ICD-10-CM

## 2023-02-09 DIAGNOSIS — D7589 Other specified diseases of blood and blood-forming organs: Secondary | ICD-10-CM

## 2023-02-09 NOTE — Progress Notes (Signed)
Gastroenterology Office Note     Primary Care Physician:  Gilmore Laroche, FNP  Primary Gastroenterologist: Dr. Marletta Lor    Chief Complaint   Chief Complaint  Patient presents with   Follow-up    Recall letter on ov, pt need Rx's filled     History of Present Illness   TAMMERA CUMPLIDO is a 37 y.o. female presenting today with a history of constipation, rectal bleeding, and GERD, initially seen in March 2024 to establish care. She had been scheduled for colonoscopy/EGD/dilation, but her ride canceled on her twice. She would still like to complete this in the future when she has transportation.   Constipation: Linzess 290 mcg had been doing well until about a month ago. Bloating with eating. Negative celiac serologies. Not having a BM up to a week at a time. Would not bloat after eating when having good bowel output. TSH normal in Dec 2023. Labs upcoming with PCP tomorrow.   Bloats in mid abdomen postprandially then spreads out bilaterally. Associated nausea/vomiting the other night. Gallbladder absent. This does not occur when having regular bowel movements. Rectal bleeding rare.   Doesn't eat a lot. Hasn't changed much of diet habits. If drinks too much water, will mess with migraines.   GERD controlled on pantoprazole daily.     Past Medical History:  Diagnosis Date   Asthma    Carpal tunnel syndrome    Gall stones    GERD (gastroesophageal reflux disease)    Hx of constipation    Migraines    Nexplanon insertion 10/11/2013   Inserted left arm 10/11/13 remove 5/27 /18    Past Surgical History:  Procedure Laterality Date   CARPAL TUNNEL RELEASE Right 06/22/2022   Procedure: CARPAL TUNNEL RELEASE;  Surgeon: Oliver Barre, MD;  Location: [facility];  Service: Orthopedics;  Laterality: Right;   CHOLECYSTECTOMY N/A 04/06/2014   Procedure: LAPAROSCOPIC CHOLECYSTECTOMY;  Surgeon: Dalia Heading, MD;  Location: [facility];  Service: General;  Laterality: N/A;   CYSTOSCOPY W/  URETERAL STENT PLACEMENT     EXCISION OF SKIN TAG Right 02/28/2019   Procedure: EXCISION OF GLUTEAL SKIN TAG;  Surgeon: Tilda Burrow, MD;  Location: [facility];  Service: Gynecology;  Laterality: Right;   LAPAROSCOPIC BILATERAL SALPINGECTOMY Bilateral 02/28/2019   Procedure: LAPAROSCOPIC BILATERAL SALPINGECTOMY;  Surgeon: Tilda Burrow, MD;  Location: [facility];  Service: Gynecology;  Laterality: Bilateral;   NORPLANT REMOVAL Left 02/28/2019   Procedure: REMOVAL OF NORPLANT;  Surgeon: Tilda Burrow, MD;  Location: [facility];  Service: Gynecology;  Laterality: Left;   TONSILECTOMY, ADENOIDECTOMY, BILATERAL MYRINGOTOMY AND TUBES     age 51   TONSILLECTOMY     TUBAL LIGATION      Current Outpatient Medications  Medication Sig Dispense Refill   albuterol (VENTOLIN HFA) 108 (90 Base) MCG/ACT inhaler Inhale 1-2 puffs into the lungs every 6 (six) hours as needed for wheezing or shortness of breath. 8 g 3   budesonide-formoterol (SYMBICORT) 160-4.5 MCG/ACT inhaler Inhale 2 puffs into the lungs 2 (two) times daily. 10.2 g 1   cyclobenzaprine (FLEXERIL) 5 MG tablet Take 1 tablet (5 mg total) by mouth at bedtime as needed for muscle spasms. 30 tablet 1   linaclotide (LINZESS) 290 MCG CAPS capsule Take 1 capsule (290 mcg total) by mouth daily before breakfast. 30 capsule 5   megestrol (MEGACE) 40 MG tablet Take 40 mg by mouth 2 (two) times daily.     ondansetron (ZOFRAN) 4 MG  tablet Take 4 mg by mouth every 8 (eight) hours as needed for nausea or vomiting.     pantoprazole (PROTONIX) 40 MG tablet Take 1 tablet (40 mg total) by mouth 2 (two) times daily before a meal. (Patient taking differently: Take 40 mg by mouth daily.) 60 tablet 3   SUMAtriptan (IMITREX) 25 MG tablet Take 1 tablet (25 mg total) by mouth every 2 (two) hours as needed for migraine. May repeat in 2 hours if headache persists or recurs (max 200 mg per 24-hour period) 30 tablet 0   hydrOXYzine (VISTARIL) 25 MG capsule Take 1 capsule (25 mg  total) by mouth every 8 (eight) hours as needed. (Patient not taking: Reported on 02/09/2023) 30 capsule 0   methylPREDNISolone (MEDROL DOSEPAK) 4 MG TBPK tablet Take as the package instructed (Patient not taking: Reported on 02/09/2023) 1 each 0   rosuvastatin (CRESTOR) 10 MG tablet Take 1 tablet (10 mg total) by mouth daily. (Patient not taking: Reported on 01/15/2023) 90 tablet 1   sertraline (ZOLOFT) 25 MG tablet Take 1 tablet (25 mg total) by mouth daily. (Patient not taking: Reported on 01/15/2023) 30 tablet 3   Vitamin D, Ergocalciferol, (DRISDOL) 1.25 MG (50000 UNIT) CAPS capsule Take 1 capsule (50,000 Units total) by mouth every 7 (seven) days. (Patient not taking: Reported on 01/15/2023) 20 capsule 1   No current facility-administered medications for this visit.    Allergies as of 02/09/2023 - Review Complete 02/09/2023  Allergen Reaction Noted   Vinegar [acetic acid] Hives and Itching 03/06/2013   Sulfa antibiotics Rash 06/07/2011    Family History  Problem Relation Age of Onset   Hypertension Mother    Diabetes Mother    Hyperlipidemia Mother    Breast cancer Mother 21   Cirrhosis Father        liver transplant and subsequent rejection   Hypertension Brother    Diabetes Maternal Grandmother    Heart disease Maternal Grandmother    Other Paternal Aunt        benign breast lump   Colon cancer Neg Hx    Colon polyps Neg Hx     Social History   Socioeconomic History   Marital status: Married    Spouse name: Not on file   Number of children: 2   Years of education: Not on file   Highest education level: 12th grade  Occupational History   Not on file  Tobacco Use   Smoking status: Every Day    Current packs/day: 1.00    Average packs/day: 1 pack/day for 11.0 years (11.0 ttl pk-yrs)    Types: Cigarettes    Passive exposure: Current   Smokeless tobacco: Never  Vaping Use   Vaping status: Never Used  Substance and Sexual Activity   Alcohol use: No   Drug use: No    Sexual activity: Yes    Birth control/protection: Surgical    Comment: tubal  Other Topics Concern   Not on file  Social History Narrative   Not on file   Social Determinants of Health   Financial Resource Strain: Low Risk  (09/10/2022)   Overall Financial Resource Strain (CARDIA)    Difficulty of Paying Living Expenses: Not hard at all  Food Insecurity: No Food Insecurity (09/10/2022)   Hunger Vital Sign    Worried About Running Out of Food in the Last Year: Never true    Ran Out of Food in the Last Year: Never true  Transportation Needs: No Transportation Needs (09/10/2022)  PRAPARE - Administrator, Civil Service (Medical): No    Lack of Transportation (Non-Medical): No  Physical Activity: Unknown (09/10/2022)   Exercise Vital Sign    Days of Exercise per Week: 0 days    Minutes of Exercise per Session: Not on file  Recent Concern: Physical Activity - Inactive (09/10/2022)   Exercise Vital Sign    Days of Exercise per Week: 0 days    Minutes of Exercise per Session: 40 min  Stress: Patient Declined (09/10/2022)   [employer] of Occupational Health - Occupational Stress Questionnaire    Feeling of Stress : Patient declined  Social Connections: Moderately Isolated (09/10/2022)   Social Connection and Isolation Panel [NHANES]    Frequency of Communication with Friends and Family: More than three times a week    Frequency of Social Gatherings with Friends and Family: More than three times a week    Attends Religious Services: Never    Database administrator or Organizations: No    Attends Engineer, structural: Not on file    Marital Status: Married  Catering manager Violence: Not At Risk (12/12/2021)   Humiliation, Afraid, Rape, and Kick questionnaire    Fear of Current or Ex-Partner: No    Emotionally Abused: No    Physically Abused: No    Sexually Abused: No     Review of Systems   Gen: Denies any fever, chills, fatigue, weight loss, lack of  appetite.  CV: Denies chest pain, heart palpitations, peripheral edema, syncope.  Resp: Denies shortness of breath at rest or with exertion. Denies wheezing or cough.  GI: Denies dysphagia or odynophagia. Denies jaundice, hematemesis, fecal incontinence. GU : Denies urinary burning, urinary frequency, urinary hesitancy MS: Denies joint pain, muscle weakness, cramps, or limitation of movement.  Derm: Denies rash, itching, dry skin Psych: Denies depression, anxiety, memory loss, and confusion Heme: Denies bruising, bleeding, and enlarged lymph nodes.   Physical Exam   BP 134/87   Pulse 79   Temp 98.8 F (37.1 C)   Ht 5\' 1"  (1.549 m)   Wt 226 lb 9.6 oz (102.8 kg)   BMI 42.82 kg/m  General:   Alert and oriented. Pleasant and cooperative. Well-nourished and well-developed.  Head:  Normocephalic and atraumatic. Eyes:  Without icterus Abdomen:  +BS, soft, non-tender and non-distended. No HSM noted. No guarding or rebound. No masses appreciated.  Rectal:  Deferred  Msk:  Symmetrical without gross deformities. Normal posture. Extremities:  Without edema. Neurologic:  Alert and  oriented x4;  grossly normal neurologically. Skin:  Intact without significant lesions or rashes. Psych:  Alert and cooperative. Normal mood and affect.   Assessment   HAYDYN MACKINS is a 37 y.o. female presenting today with a history of  constipation, rectal bleeding, and GERD, initially seen in March 2024 to establish care. She had been scheduled for colonoscopy/EGD/dilation, but her ride canceled on her twice. She would still like to complete this in the future when she has transportation.    Constipation: Linzess lost efficacy. Trial Trulance. IF this is not effective, will trial Motegrity or Amitiza. TSH normal recently. Labs upcoming with PCP tomorrow. Bloating appears secondary to constipation and improved with good bowel regimen.   GERD: on PPI daily with good results.  Rectal bleeding: scant now.  Suspect internal hemorrhoids. Still recommend colonoscopy when able with transportation.  Chronically elevated  Hgb/Hct: chronic smoker and suspect this is culprit. Check iron studies.  PLAN    Trial of Trulance Message with update Continue PPI Colonoscopy when able Iron studies 6 week follow-up   Gelene Mink, PhD, ANP-BC [facility]

## 2023-02-09 NOTE — Patient Instructions (Signed)
Let's stop Linzess. Instead, let's try Trulance one tablet each day with or without food.   Message me with an update! If this is not helpful, we will trial something different.  Please have blood work done tomorrow with Malachi Bonds (the iron studies).  I will see you in 6 weeks!  I enjoyed seeing you again today! I value our relationship and want to provide genuine, compassionate, and quality care. You may receive a survey regarding your visit with me, and I welcome your feedback! Thanks so much for taking the time to complete this. I look forward to seeing you again.      Gelene Mink, PhD, ANP-BC [facility]

## 2023-02-10 ENCOUNTER — Encounter: Payer: Self-pay | Admitting: Family Medicine

## 2023-02-10 ENCOUNTER — Ambulatory Visit: Payer: Medicaid Other | Admitting: Family Medicine

## 2023-02-10 VITALS — BP 137/82 | HR 90 | Ht 61.0 in | Wt 225.1 lb

## 2023-02-10 DIAGNOSIS — D7589 Other specified diseases of blood and blood-forming organs: Secondary | ICD-10-CM

## 2023-02-10 DIAGNOSIS — F419 Anxiety disorder, unspecified: Secondary | ICD-10-CM

## 2023-02-10 NOTE — Progress Notes (Addendum)
Established Patient Office Visit  Subjective:  Patient ID: Patricia Williamson, female    DOB: 1985/07/17  Age: 37 y.o. MRN: [id number]  CC:  Chief Complaint  Patient presents with   Follow-up    Follow up fall recently     HPI Patricia Williamson is a 37 y.o. female  presents for anxiety follow-up.    Sprain of left ankle:The patient reports that her left ankle is doing well, with intact range of motion and no pain today. She notes occasional swelling and has been educated on the importance of elevating her legs and reducing her intake of high-sodium foods.  Anxiety:The patient has discontinued Zoloft 25 mg daily due to a worsening of her symptoms. She has been managing her anxiety with nonpharmacological interventions and reports feeling well. She denies any suicidal thoughts or ideation and has declined a referral for talk therapy at this time.  Past Medical History:  Diagnosis Date   Asthma    Carpal tunnel syndrome    Gall stones    GERD (gastroesophageal reflux disease)    Hx of constipation    Migraines    Nexplanon insertion 10/11/2013   Inserted left arm 10/11/13 remove 5/27 /18    Past Surgical History:  Procedure Laterality Date   CARPAL TUNNEL RELEASE Right 06/22/2022   Procedure: CARPAL TUNNEL RELEASE;  Surgeon: Oliver Barre, MD;  Location: [facility];  Service: Orthopedics;  Laterality: Right;   CHOLECYSTECTOMY N/A 04/06/2014   Procedure: LAPAROSCOPIC CHOLECYSTECTOMY;  Surgeon: Dalia Heading, MD;  Location: [facility];  Service: General;  Laterality: N/A;   CYSTOSCOPY W/ URETERAL STENT PLACEMENT     EXCISION OF SKIN TAG Right 02/28/2019   Procedure: EXCISION OF GLUTEAL SKIN TAG;  Surgeon: Tilda Burrow, MD;  Location: [facility];  Service: Gynecology;  Laterality: Right;   LAPAROSCOPIC BILATERAL SALPINGECTOMY Bilateral 02/28/2019   Procedure: LAPAROSCOPIC BILATERAL SALPINGECTOMY;  Surgeon: Tilda Burrow, MD;  Location: [facility];  Service: Gynecology;  Laterality: Bilateral;    NORPLANT REMOVAL Left 02/28/2019   Procedure: REMOVAL OF NORPLANT;  Surgeon: Tilda Burrow, MD;  Location: [facility];  Service: Gynecology;  Laterality: Left;   TONSILECTOMY, ADENOIDECTOMY, BILATERAL MYRINGOTOMY AND TUBES     age 32   TONSILLECTOMY     TUBAL LIGATION      Family History  Problem Relation Age of Onset   Hypertension Mother    Diabetes Mother    Hyperlipidemia Mother    Breast cancer Mother 69   Cirrhosis Father        liver transplant and subsequent rejection   Hypertension Brother    Diabetes Maternal Grandmother    Heart disease Maternal Grandmother    Other Paternal Aunt        benign breast lump   Colon cancer Neg Hx    Colon polyps Neg Hx     Social History   Socioeconomic History   Marital status: Married    Spouse name: Not on file   Number of children: 2   Years of education: Not on file   Highest education level: 12th grade  Occupational History   Not on file  Tobacco Use   Smoking status: Every Day    Current packs/day: 1.00    Average packs/day: 1 pack/day for 11.0 years (11.0 ttl pk-yrs)    Types: Cigarettes    Passive exposure: Current   Smokeless tobacco: Never  Vaping Use   Vaping status: Never Used  Substance and  Sexual Activity   Alcohol use: No   Drug use: No   Sexual activity: Yes    Birth control/protection: Surgical    Comment: tubal  Other Topics Concern   Not on file  Social History Narrative   Not on file   Social Determinants of Health   Financial Resource Strain: Low Risk  (09/10/2022)   Overall Financial Resource Strain (CARDIA)    Difficulty of Paying Living Expenses: Not hard at all  Food Insecurity: No Food Insecurity (09/10/2022)   Hunger Vital Sign    Worried About Running Out of Food in the Last Year: Never true    Ran Out of Food in the Last Year: Never true  Transportation Needs: No Transportation Needs (09/10/2022)   PRAPARE - Administrator, Civil Service (Medical): No    Lack of  Transportation (Non-Medical): No  Physical Activity: Unknown (09/10/2022)   Exercise Vital Sign    Days of Exercise per Week: 0 days    Minutes of Exercise per Session: Not on file  Recent Concern: Physical Activity - Inactive (09/10/2022)   Exercise Vital Sign    Days of Exercise per Week: 0 days    Minutes of Exercise per Session: 40 min  Stress: Patient Declined (09/10/2022)   [employer] of Occupational Health - Occupational Stress Questionnaire    Feeling of Stress : Patient declined  Social Connections: Moderately Isolated (09/10/2022)   Social Connection and Isolation Panel [NHANES]    Frequency of Communication with Friends and Family: More than three times a week    Frequency of Social Gatherings with Friends and Family: More than three times a week    Attends Religious Services: Never    Database administrator or Organizations: No    Attends Engineer, structural: Not on file    Marital Status: Married  Catering manager Violence: Not At Risk (12/12/2021)   Humiliation, Afraid, Rape, and Kick questionnaire    Fear of Current or Ex-Partner: No    Emotionally Abused: No    Physically Abused: No    Sexually Abused: No    Outpatient Medications Prior to Visit  Medication Sig Dispense Refill   albuterol (VENTOLIN HFA) 108 (90 Base) MCG/ACT inhaler Inhale 1-2 puffs into the lungs every 6 (six) hours as needed for wheezing or shortness of breath. 8 g 3   budesonide-formoterol (SYMBICORT) 160-4.5 MCG/ACT inhaler Inhale 2 puffs into the lungs 2 (two) times daily. 10.2 g 1   megestrol (MEGACE) 40 MG tablet Take 40 mg by mouth 2 (two) times daily.     pantoprazole (PROTONIX) 40 MG tablet Take 1 tablet (40 mg total) by mouth 2 (two) times daily before a meal. (Patient taking differently: Take 40 mg by mouth daily.) 60 tablet 3   cyclobenzaprine (FLEXERIL) 5 MG tablet Take 1 tablet (5 mg total) by mouth at bedtime as needed for muscle spasms. 30 tablet 1   linaclotide  (LINZESS) 290 MCG CAPS capsule Take 1 capsule (290 mcg total) by mouth daily before breakfast. 30 capsule 5   ondansetron (ZOFRAN) 4 MG tablet Take 4 mg by mouth every 8 (eight) hours as needed for nausea or vomiting.     SUMAtriptan (IMITREX) 25 MG tablet Take 1 tablet (25 mg total) by mouth every 2 (two) hours as needed for migraine. May repeat in 2 hours if headache persists or recurs (max 200 mg per 24-hour period) 30 tablet 0   No facility-administered medications prior to visit.  Allergies  Allergen Reactions   Vinegar [Acetic Acid] Hives and Itching   Sulfa Antibiotics Rash    ROS Review of Systems  Constitutional:  Negative for chills and fever.  Eyes:  Negative for visual disturbance.  Respiratory:  Negative for chest tightness and shortness of breath.   Neurological:  Negative for dizziness and headaches.      Objective:    Physical Exam HENT:     Head: Normocephalic.     Mouth/Throat:     Mouth: Mucous membranes are moist.  Cardiovascular:     Rate and Rhythm: Normal rate.     Heart sounds: Normal heart sounds.  Pulmonary:     Effort: Pulmonary effort is normal.     Breath sounds: Normal breath sounds.  Musculoskeletal:     Right ankle: No swelling, deformity or ecchymosis. No tenderness.     Left ankle: No swelling, deformity or ecchymosis. No tenderness. Normal range of motion.  Neurological:     Mental Status: She is alert.     BP 137/82 (BP Location: Right Arm, Patient Position: Sitting, Cuff Size: Large)   Pulse 90   Ht 5\' 1"  (1.549 m)   Wt 225 lb 1.9 oz (102.1 kg)   SpO2 94%   BMI 42.54 kg/m  Wt Readings from Last 3 Encounters:  02/10/23 225 lb 1.9 oz (102.1 kg)  02/09/23 226 lb 9.6 oz (102.8 kg)  01/15/23 224 lb (101.6 kg)    Lab Results  Component Value Date   TSH 1.590 05/05/2022   Lab Results  Component Value Date   WBC 8.9 08/06/2022   HGB 16.1 (H) 08/06/2022   HCT 49.4 (H) 08/06/2022   MCV 92 08/06/2022   PLT 287 08/06/2022    Lab Results  Component Value Date   NA 143 08/06/2022   K 4.0 08/06/2022   CO2 18 (L) 08/06/2022   GLUCOSE 88 08/06/2022   BUN 7 08/06/2022   CREATININE 0.68 08/06/2022   BILITOT <0.2 08/06/2022   ALKPHOS 57 08/06/2022   AST 12 08/06/2022   ALT 16 08/06/2022   PROT 7.1 08/06/2022   ALBUMIN 4.4 08/06/2022   CALCIUM 9.5 08/06/2022   ANIONGAP 7 10/01/2021   EGFR 117 08/06/2022   Lab Results  Component Value Date   CHOL 246 (H) 08/06/2022   Lab Results  Component Value Date   HDL 24 (L) 08/06/2022   Lab Results  Component Value Date   LDLCALC 169 (H) 08/06/2022   Lab Results  Component Value Date   TRIG 276 (H) 08/06/2022   Lab Results  Component Value Date   CHOLHDL 10.3 (H) 08/06/2022   Lab Results  Component Value Date   HGBA1C 5.9 (H) 08/06/2022      Assessment & Plan:  Anxiety Assessment & Plan: Encourage nonpharmacologic management of anxiety   Mindfulness and Meditation Practices like mindfulness meditation can help reduce symptoms by promoting relaxation and present-moment awareness.  Exercise  Regular physical activity has been shown to improve mood and reduce anxiety through the release of endorphins and other neurochemicals.  Healthy Diet Eating a balanced diet rich in fruits, vegetables, whole grains, and lean proteins can support overall mental health.  Sleep Hygiene  Establishing a regular sleep routine and ensuring good sleep quality can significantly impact mood and anxiety levels.  Stress Management Techniques Activities such as yoga, tai chi, and deep breathing exercises can help manage stress.  Social Support Maintaining strong relationships and seeking support from friends, family, or support  groups can provide emotional comfort and reduce feelings of isolation.  Lifestyle Modifications Reducing alcohol and caffeine intake, quitting smoking, and avoiding recreational drugs can improve symptoms.  Art and Music Therapy Engaging in  creative activities like painting, drawing, or playing music can be therapeutic and help express emotions.  Light Therapy Particularly useful for seasonal affective disorder (SAD), exposure to bright light can help regulate mood. .    Macrocytosis -     Iron, TIBC and Ferritin Panel  Note: This chart has been completed using Engineer, civil (consulting) software, and while attempts have been made to ensure accuracy, certain words and phrases may not be transcribed as intended.    Follow-up: Return in about 4 months (around 06/12/2023).   Gilmore Laroche, FNP

## 2023-02-10 NOTE — Patient Instructions (Addendum)
I appreciate the opportunity to provide care to you today!    Follow up:  4 months  Labs: please stop by the lab today to get your blood drawn   Nonpharmacologic management of anxiety   Mindfulness and Meditation Practices like mindfulness meditation can help reduce symptoms by promoting relaxation and present-moment awareness.  Exercise  Regular physical activity has been shown to improve mood and reduce anxiety through the release of endorphins and other neurochemicals.  Healthy Diet Eating a balanced diet rich in fruits, vegetables, whole grains, and lean proteins can support overall mental health.  Sleep Hygiene  Establishing a regular sleep routine and ensuring good sleep quality can significantly impact mood and anxiety levels.  Stress Management Techniques Activities such as yoga, tai chi, and deep breathing exercises can help manage stress.  Social Support Maintaining strong relationships and seeking support from friends, family, or support groups can provide emotional comfort and reduce feelings of isolation.  Lifestyle Modifications Reducing alcohol and caffeine intake, quitting smoking, and avoiding recreational drugs can improve symptoms.  Art and Music Therapy Engaging in creative activities like painting, drawing, or playing music can be therapeutic and help express emotions.  Light Therapy Particularly useful for seasonal affective disorder (SAD), exposure to bright light can help regulate mood. .     Please continue to a heart-healthy diet and increase your physical activities. Try to exercise for at least five days a week.    It was a pleasure to see you and I look forward to continuing to work together on your health and well-being. Please do not hesitate to call the office if you need care or have questions about your care.  In case of emergency, please visit the Emergency Department for urgent care, or contact our clinic at [phone] to schedule an  appointment. We're here to help you!   Have a wonderful day and week. With Gratitude, Gilmore Laroche MSN, FNP-BC

## 2023-02-10 NOTE — Assessment & Plan Note (Signed)
Encourage nonpharmacologic management of anxiety   Mindfulness and Meditation Practices like mindfulness meditation can help reduce symptoms by promoting relaxation and present-moment awareness.  Exercise  Regular physical activity has been shown to improve mood and reduce anxiety through the release of endorphins and other neurochemicals.  Healthy Diet Eating a balanced diet rich in fruits, vegetables, whole grains, and lean proteins can support overall mental health.  Sleep Hygiene  Establishing a regular sleep routine and ensuring good sleep quality can significantly impact mood and anxiety levels.  Stress Management Techniques Activities such as yoga, tai chi, and deep breathing exercises can help manage stress.  Social Support Maintaining strong relationships and seeking support from friends, family, or support groups can provide emotional comfort and reduce feelings of isolation.  Lifestyle Modifications Reducing alcohol and caffeine intake, quitting smoking, and avoiding recreational drugs can improve symptoms.  Art and Music Therapy Engaging in creative activities like painting, drawing, or playing music can be therapeutic and help express emotions.  Light Therapy Particularly useful for seasonal affective disorder (SAD), exposure to bright light can help regulate mood. Marland Kitchen

## 2023-02-11 LAB — IRON,TIBC AND FERRITIN PANEL
Ferritin: 174 ng/mL — ABNORMAL HIGH (ref 15–150)
Iron Saturation: 29 % (ref 15–55)
Iron: 103 ug/dL (ref 27–159)
Total Iron Binding Capacity: 359 ug/dL (ref 250–450)
UIBC: 256 ug/dL (ref 131–425)

## 2023-02-16 MED ORDER — TRULANCE 3 MG PO TABS: 1.0000 | ORAL_TABLET | Freq: Every day | ORAL | 3 refills | Status: AC

## 2023-02-17 ENCOUNTER — Telehealth: Payer: Self-pay

## 2023-02-17 NOTE — Telephone Encounter (Signed)
PA done for Trulance 3mg  tabs on Cover My Meds. Tried/failed: Linzess , Dx used: K59.04.   Pt approved for 02/17/2023--02/17/2024. Approved J code. Pt advised through MyChart

## 2023-02-17 NOTE — Telephone Encounter (Signed)
noted 

## 2023-03-03 ENCOUNTER — Telehealth (INDEPENDENT_AMBULATORY_CARE_PROVIDER_SITE_OTHER): Payer: Medicaid Other | Admitting: Family Medicine

## 2023-03-03 ENCOUNTER — Encounter: Payer: Self-pay | Admitting: Family Medicine

## 2023-03-03 VITALS — Ht 61.0 in | Wt 226.0 lb

## 2023-03-03 DIAGNOSIS — B349 Viral infection, unspecified: Secondary | ICD-10-CM

## 2023-03-03 MED ORDER — PROMETHAZINE-DM 6.25-15 MG/5ML PO SYRP
5.0000 mL | ORAL_SOLUTION | Freq: Four times a day (QID) | ORAL | 0 refills | Status: AC | PRN
Start: 2023-03-03 — End: ?

## 2023-03-03 NOTE — Progress Notes (Signed)
Virtual Visit via Video Note  I connected with Patricia Williamson on 03/03/23 at  2:20 PM EDT by a video enabled telemedicine application and verified that I am speaking with the correct person using two identifiers.  Patient Location: Other:  in her car Provider Location: Home Office  I discussed the limitations, risks, security, and privacy concerns of performing an evaluation and management service by video and the availability of in person appointments. I also discussed with the patient that there may be a patient responsible charge related to this service. The patient expressed understanding and agreed to proceed.  Subjective: PCP: Gilmore Laroche, FNP  Chief Complaint  Patient presents with   Cough    Cough, runny nose, irritated throat. stopped up ears.   HPI The patient is in today with complaints of a dry cough that is worse at night, a runny nose, sneezing, and a scratchy throat. She reports that her ears occasionally feel stuffed. She denies fever, chills, recent COVID exposure, body aches, facial pain or pressure, and GI symptoms. She notes that her daughter had similar symptoms last week, and they tested negative for both COVID and flu. The patient has been using over-the-counter treatments with minimal relief. The onset of symptoms was 2 days ago, on 03/01/2023.    ROS: Per HPI  Current Outpatient Medications:    albuterol (VENTOLIN HFA) 108 (90 Base) MCG/ACT inhaler, Inhale 1-2 puffs into the lungs every 6 (six) hours as needed for wheezing or shortness of breath., Disp: 8 g, Rfl: 3   budesonide-formoterol (SYMBICORT) 160-4.5 MCG/ACT inhaler, Inhale 2 puffs into the lungs 2 (two) times daily., Disp: 10.2 g, Rfl: 1   megestrol (MEGACE) 40 MG tablet, Take 40 mg by mouth 2 (two) times daily., Disp: , Rfl:    pantoprazole (PROTONIX) 40 MG tablet, Take 1 tablet (40 mg total) by mouth 2 (two) times daily before a meal. (Patient taking differently: Take 40 mg by mouth daily.),  Disp: 60 tablet, Rfl: 3   Plecanatide (TRULANCE) 3 MG TABS, Take 1 tablet (3 mg total) by mouth daily., Disp: 90 tablet, Rfl: 3   promethazine-dextromethorphan (PROMETHAZINE-DM) 6.25-15 MG/5ML syrup, Take 5 mLs by mouth 4 (four) times daily as needed., Disp: 118 mL, Rfl: 0  Observations/Objective: Today's Vitals   03/03/23 1420  Weight: 226 lb (102.5 kg)  Height: 5\' 1"  (1.549 m)  PainSc: 0-No pain   Physical Exam Patient is well-developed, well-nourished in no acute distress.  Resting comfortably at home.  Head is normocephalic, atraumatic.  No labored breathing.  Speech is clear and coherent with logical content.  Patient is alert and oriented at baseline.   Assessment and Plan: Viral illness -     Promethazine-DM; Take 5 mLs by mouth 4 (four) times daily as needed.  Dispense: 118 mL; Refill: 0  Take medication as prescribed. Increase fluids and allow for plenty of rest. Recommend Tylenol as needed for pain, fever, or general discomfort. Warm salt water gargles 3-4 times daily to help with throat pain or discomfort. Recommend using a humidifier at bedtime during sleep to help with cough and nasal congestion. Follow-up if your symptoms do not improve    Follow Up Instructions: No follow-ups on file.   I discussed the assessment and treatment plan with the patient. The patient was provided an opportunity to ask questions, and all were answered. The patient agreed with the plan and demonstrated an understanding of the instructions.   The patient was advised to call back  or seek an in-person evaluation if the symptoms worsen or if the condition fails to improve as anticipated.  The above assessment and management plan was discussed with the patient. The patient verbalized understanding of and has agreed to the management plan.   Gilmore Laroche, FNP

## 2023-03-10 ENCOUNTER — Other Ambulatory Visit: Payer: Self-pay

## 2023-03-10 ENCOUNTER — Emergency Department (HOSPITAL_COMMUNITY): Payer: Medicaid Other

## 2023-03-10 ENCOUNTER — Emergency Department (HOSPITAL_COMMUNITY)
Admission: EM | Admit: 2023-03-10 | Discharge: 2023-03-10 | Disposition: A | Discharge status: Home / Self Care | Payer: Medicaid Other | Attending: Emergency Medicine | Admitting: Emergency Medicine

## 2023-03-10 DIAGNOSIS — M7989 Other specified soft tissue disorders: Secondary | ICD-10-CM | POA: Diagnosis present

## 2023-03-10 DIAGNOSIS — Z7951 Long term (current) use of inhaled steroids: Secondary | ICD-10-CM | POA: Insufficient documentation

## 2023-03-10 DIAGNOSIS — J45909 Unspecified asthma, uncomplicated: Secondary | ICD-10-CM | POA: Insufficient documentation

## 2023-03-10 DIAGNOSIS — I82452 Acute embolism and thrombosis of left peroneal vein: Secondary | ICD-10-CM | POA: Insufficient documentation

## 2023-03-10 MED ORDER — APIXABAN (ELIQUIS) VTE STARTER PACK (10MG AND 5MG): ORAL_TABLET | ORAL | 0 refills | Status: AC

## 2023-03-10 NOTE — ED Triage Notes (Signed)
Left foot hurt a month ago from a fall, had sprained and was noted to have a heel spure.  Swelling from ankle to back of knee. Has tingling and dull aching pain that radiates from heel up. Calf gets tight and difficulty bending knee.

## 2023-03-10 NOTE — ED Provider Notes (Signed)
[facility] EMERGENCY DEPARTMENT AT [facility] Provider Note   CSN: [id number] Arrival date & time: 03/10/23  2841     History  Chief Complaint  Patient presents with   Leg Pain    Patricia Williamson is a 37 y.o. female.  HPI Patient presents for left leg swelling.  Medical history includes migraines, GERD, constipation, asthma.  She had recent URI symptoms a week ago.  A month ago, she had a injury to her left foot.  Initially, she was told that she had a bone spur on her heel.  She was subsequently told that she had a sprain.  She was in a boot for about a week.  She has been elevating her leg when possible.  She has recently noticed intermittent swelling to lower leg and foot.  She is on her feet throughout most of the day.  When swelling occurs, she will have some tingling in her toes.  She will pain radiating up to her thigh.  Currently, she is asymptomatic.    Home Medications Prior to Admission medications   Medication Sig Start Date End Date Taking? Authorizing Provider  APIXABAN (ELIQUIS) VTE STARTER PACK (10MG  AND 5MG ) Take as directed on package: start with two-5mg  tablets twice daily for 7 days. On day 8, switch to one-5mg  tablet twice daily. 03/10/23  Yes Gloris Manchester, MD  albuterol (VENTOLIN HFA) 108 (90 Base) MCG/ACT inhaler Inhale 1-2 puffs into the lungs every 6 (six) hours as needed for wheezing or shortness of breath. 09/10/22   Del Newman Nip, Tenna Child, FNP  budesonide-formoterol (SYMBICORT) 160-4.5 MCG/ACT inhaler Inhale 2 puffs into the lungs 2 (two) times daily. 12/30/22   Gilmore Laroche, FNP  megestrol (MEGACE) 40 MG tablet Take 40 mg by mouth 2 (two) times daily.    [provider]  pantoprazole (PROTONIX) 40 MG tablet Take 1 tablet (40 mg total) by mouth 2 (two) times daily before a meal. Patient taking differently: Take 40 mg by mouth daily. 07/23/22   Gelene Mink, NP  Plecanatide (TRULANCE) 3 MG TABS Take 1 tablet (3 mg total) by mouth daily.  02/16/23   Gelene Mink, NP  promethazine-dextromethorphan (PROMETHAZINE-DM) 6.25-15 MG/5ML syrup Take 5 mLs by mouth 4 (four) times daily as needed. 03/03/23   Gilmore Laroche, FNP      Allergies    Vinegar [acetic acid] and Sulfa antibiotics    Review of Systems   Review of Systems  Cardiovascular:  Positive for leg swelling.  All other systems reviewed and are negative.   Physical Exam Updated Vital Signs BP 119/61   Pulse 75   Temp 98.1 F (36.7 C) (Oral)   Resp 17   Ht 5\' 1"  (1.549 m)   Wt 102.5 kg   SpO2 97%   BMI 42.70 kg/m  Physical Exam Vitals and nursing note reviewed.  Constitutional:      General: She is not in acute distress.    Appearance: Normal appearance. She is well-developed. She is not ill-appearing, toxic-appearing or diaphoretic.  HENT:     Head: Normocephalic and atraumatic.     Right Ear: External ear normal.     Left Ear: External ear normal.     Nose: Nose normal.     Mouth/Throat:     Mouth: Mucous membranes are moist.  Eyes:     Extraocular Movements: Extraocular movements intact.     Conjunctiva/sclera: Conjunctivae normal.  Cardiovascular:     Rate and Rhythm: Normal rate and  regular rhythm.  Pulmonary:     Effort: Pulmonary effort is normal. No respiratory distress.  Abdominal:     General: There is no distension.     Palpations: Abdomen is soft.     Tenderness: There is no abdominal tenderness.  Musculoskeletal:        General: No swelling, tenderness or deformity. Normal range of motion.     Cervical back: Normal range of motion and neck supple.     Right lower leg: No edema.     Left lower leg: No edema.  Skin:    General: Skin is warm and dry.     Coloration: Skin is not jaundiced or pale.  Neurological:     General: No focal deficit present.     Mental Status: She is alert and oriented to person, place, and time.  Psychiatric:        Mood and Affect: Mood normal.        Behavior: Behavior normal.     ED Results /  Procedures / Treatments   Labs (all labs ordered are listed, but only abnormal results are displayed) Labs Reviewed - No data to display  EKG None  Radiology US Venous Img Lower Unilateral Left  Result Date: 03/10/2023 CLINICAL DATA:  Left lower extremity pain and edema. Evaluate for DVT. EXAM: LEFT LOWER EXTREMITY VENOUS DOPPLER ULTRASOUND TECHNIQUE: Gray-scale sonography with graded compression, as well as color Doppler and duplex ultrasound were performed to evaluate the lower extremity deep venous systems from the level of the common femoral vein and including the common femoral, femoral, profunda femoral, popliteal and calf veins including the posterior tibial, peroneal and gastrocnemius veins when visible. The superficial great saphenous vein was also interrogated. Spectral Doppler was utilized to evaluate flow at rest and with distal augmentation maneuvers in the common femoral, femoral and popliteal veins. COMPARISON:  None Available. FINDINGS: Contralateral Common Femoral Vein: Respiratory phasicity is normal and symmetric with the symptomatic side. No evidence of thrombus. Normal compressibility. Common Femoral Vein: No evidence of thrombus. Normal compressibility, respiratory phasicity and response to augmentation. Saphenofemoral Junction: No evidence of thrombus. Normal compressibility and flow on color Doppler imaging. Profunda Femoral Vein: No evidence of thrombus. Normal compressibility and flow on color Doppler imaging. Femoral Vein: No evidence of thrombus. Normal compressibility, respiratory phasicity and response to augmentation. Popliteal Vein: No evidence of thrombus. Normal compressibility, respiratory phasicity and response to augmentation. Calf Veins: There is hypoechoic occlusive thrombus involving one of the paired divisions of the left peroneal vein (images 40 and 41). The adjacent paired division of the peroneal vein appears widely patent as do the paired divisions of the left  posterior tibial vein. The anterior tibial vein appears patent where imaged. Superficial Great Saphenous Vein: No evidence of thrombus. Normal compressibility. Other Findings:  None. IMPRESSION: The examination is positive for occlusive DVT involving one of the paired divisions of the left peroneal vein. Electronically Signed   By: Simonne Come M.D.   On: 03/10/2023 09:42    Procedures Procedures    Medications Ordered in ED Medications - No data to display  ED Course/ Medical Decision Making/ A&P                                 Medical Decision Making  Patient presenting for intermittent swelling to lower left leg and foot.  She had a injury to her left foot a month ago.  She has been ambulating like normal.  When she does experience swelling, she will have pain, paresthesias, and limited range of motion of her knee.  Currently, she is asymptomatic.  She is well-appearing on exam.  There is no calf tenderness or positive Homans' sign.  I do not appreciate any swelling at this time.  Given her recent symptoms, will check for DVT.  DVT study was positive for occlusive DVT in peroneal vein.  Patient was prescribed Eliquis.  She was offered initial dose in the ED but prefers to pick it up in the pharmacy.  She does have a primary care doctor that she can follow-up with.  Patient was discharged in good condition.        Final Clinical Impression(s) / ED Diagnoses Final diagnoses:  Acute deep vein thrombosis (DVT) of left peroneal vein (HCC)    Rx / DC Orders ED Discharge Orders          Ordered    APIXABAN (ELIQUIS) VTE STARTER PACK (10MG  AND 5MG )       Note to Pharmacy: If starter pack unavailable, substitute with seventy-four 5 mg apixaban tabs following the above SIG directions.   03/10/23 1610              Gloris Manchester, MD 03/10/23 [phone]

## 2023-03-10 NOTE — Discharge Instructions (Addendum)
A prescription for medication called Eliquis was sent to your pharmacy.  This is for treatment of DVT.  Take as prescribed.  Follow-up with your primary care doctor for prescription refill.

## 2023-03-12 ENCOUNTER — Ambulatory Visit: Payer: Medicaid Other | Admitting: Family Medicine

## 2023-03-12 ENCOUNTER — Encounter: Payer: Self-pay | Admitting: Family Medicine

## 2023-03-12 VITALS — BP 137/78 | HR 104 | Ht 61.0 in | Wt 226.1 lb

## 2023-03-12 DIAGNOSIS — I82452 Acute embolism and thrombosis of left peroneal vein: Secondary | ICD-10-CM | POA: Diagnosis not present

## 2023-03-12 DIAGNOSIS — J4521 Mild intermittent asthma with (acute) exacerbation: Secondary | ICD-10-CM

## 2023-03-12 MED ORDER — PREDNISONE 20 MG PO TABS
40.0000 mg | ORAL_TABLET | Freq: Every day | ORAL | 0 refills | Status: AC
Start: 2023-03-12 — End: 2023-03-17

## 2023-03-12 NOTE — Progress Notes (Unsigned)
Established Patient Office Visit  Subjective:  Patient ID: Patricia Williamson, female    DOB: 08-Nov-1985  Age: 37 y.o. MRN: [id number]  CC:  Chief Complaint  Patient presents with   Follow-up    ED f/u from 03/10/2023, was put on blood thinner by ED. Needs to know treatment plan by pcp per ED. States she just started the eliquis yesterday.     HPI Patricia Williamson is a 37 y.o. female presents for ED f/u.  Acute DVT Thrombosis of Left Peroneal Vein:The patient was seen in the ED on 03/10/2023 with complaints of left foot swelling and some tingling in her toes. She reported that the pain radiated up to her thigh. An ultrasound of the left lower extremity confirmed an occlusive DVT in the left peroneal vein. The patient started therapy on 03/11/2023. She reports that the swelling has subsided, though she continues to experience mild pain in the left lower extremity. No redness or warmth is noted.  Asthma Exacerbation:The patient reports an increase in productive cough and shortness of breath. She also mentions that her husband often wakes her up at night due to audible wheezing. She is compliant with both her maintenance therapy and rescue inhaler.  Past Medical History:  Diagnosis Date   Asthma    Carpal tunnel syndrome    Gall stones    GERD (gastroesophageal reflux disease)    Hx of constipation    Migraines    Nexplanon insertion 10/11/2013   Inserted left arm 10/11/13 remove 5/27 /18    Past Surgical History:  Procedure Laterality Date   CARPAL TUNNEL RELEASE Right 06/22/2022   Procedure: CARPAL TUNNEL RELEASE;  Surgeon: Oliver Barre, MD;  Location: [facility];  Service: Orthopedics;  Laterality: Right;   CHOLECYSTECTOMY N/A 04/06/2014   Procedure: LAPAROSCOPIC CHOLECYSTECTOMY;  Surgeon: Dalia Heading, MD;  Location: [facility];  Service: General;  Laterality: N/A;   CYSTOSCOPY W/ URETERAL STENT PLACEMENT     EXCISION OF SKIN TAG Right 02/28/2019   Procedure: EXCISION OF GLUTEAL SKIN  TAG;  Surgeon: Tilda Burrow, MD;  Location: [facility];  Service: Gynecology;  Laterality: Right;   LAPAROSCOPIC BILATERAL SALPINGECTOMY Bilateral 02/28/2019   Procedure: LAPAROSCOPIC BILATERAL SALPINGECTOMY;  Surgeon: Tilda Burrow, MD;  Location: [facility];  Service: Gynecology;  Laterality: Bilateral;   NORPLANT REMOVAL Left 02/28/2019   Procedure: REMOVAL OF NORPLANT;  Surgeon: Tilda Burrow, MD;  Location: [facility];  Service: Gynecology;  Laterality: Left;   TONSILECTOMY, ADENOIDECTOMY, BILATERAL MYRINGOTOMY AND TUBES     age 31   TONSILLECTOMY     TUBAL LIGATION      Family History  Problem Relation Age of Onset   Hypertension Mother    Diabetes Mother    Hyperlipidemia Mother    Breast cancer Mother 63   Cirrhosis Father        liver transplant and subsequent rejection   Hypertension Brother    Diabetes Maternal Grandmother    Heart disease Maternal Grandmother    Other Paternal Aunt        benign breast lump   Colon cancer Neg Hx    Colon polyps Neg Hx     Social History   Socioeconomic History   Marital status: Married    Spouse name: Not on file   Number of children: 2   Years of education: Not on file   Highest education level: 12th grade  Occupational History   Not on file  Tobacco  Use   Smoking status: Every Day    Current packs/day: 1.00    Average packs/day: 1 pack/day for 11.0 years (11.0 ttl pk-yrs)    Types: Cigarettes    Passive exposure: Current   Smokeless tobacco: Never  Vaping Use   Vaping status: Never Used  Substance and Sexual Activity   Alcohol use: No   Drug use: No   Sexual activity: Yes    Birth control/protection: Surgical    Comment: tubal  Other Topics Concern   Not on file  Social History Narrative   Not on file   Social Determinants of Health   Financial Resource Strain: Low Risk  (09/10/2022)   Overall Financial Resource Strain (CARDIA)    Difficulty of Paying Living Expenses: Not hard at all  Food Insecurity: No Food  Insecurity (09/10/2022)   Hunger Vital Sign    Worried About Running Out of Food in the Last Year: Never true    Ran Out of Food in the Last Year: Never true  Transportation Needs: No Transportation Needs (09/10/2022)   PRAPARE - Administrator, Civil Service (Medical): No    Lack of Transportation (Non-Medical): No  Physical Activity: Unknown (09/10/2022)   Exercise Vital Sign    Days of Exercise per Week: 0 days    Minutes of Exercise per Session: Not on file  Recent Concern: Physical Activity - Inactive (09/10/2022)   Exercise Vital Sign    Days of Exercise per Week: 0 days    Minutes of Exercise per Session: 40 min  Stress: Patient Declined (09/10/2022)   [employer] of Occupational Health - Occupational Stress Questionnaire    Feeling of Stress : Patient declined  Social Connections: Moderately Isolated (09/10/2022)   Social Connection and Isolation Panel [NHANES]    Frequency of Communication with Friends and Family: More than three times a week    Frequency of Social Gatherings with Friends and Family: More than three times a week    Attends Religious Services: Never    Database administrator or Organizations: No    Attends Engineer, structural: Not on file    Marital Status: Married  Catering manager Violence: Not At Risk (12/12/2021)   Humiliation, Afraid, Rape, and Kick questionnaire    Fear of Current or Ex-Partner: No    Emotionally Abused: No    Physically Abused: No    Sexually Abused: No    Outpatient Medications Prior to Visit  Medication Sig Dispense Refill   albuterol (VENTOLIN HFA) 108 (90 Base) MCG/ACT inhaler Inhale 1-2 puffs into the lungs every 6 (six) hours as needed for wheezing or shortness of breath. 8 g 3   APIXABAN (ELIQUIS) VTE STARTER PACK (10MG  AND 5MG ) Take as directed on package: start with two-5mg  tablets twice daily for 7 days. On day 8, switch to one-5mg  tablet twice daily. 74 each 0   budesonide-formoterol (SYMBICORT)  160-4.5 MCG/ACT inhaler Inhale 2 puffs into the lungs 2 (two) times daily. 10.2 g 1   megestrol (MEGACE) 40 MG tablet Take 40 mg by mouth 2 (two) times daily.     pantoprazole (PROTONIX) 40 MG tablet Take 1 tablet (40 mg total) by mouth 2 (two) times daily before a meal. (Patient taking differently: Take 40 mg by mouth daily.) 60 tablet 3   Plecanatide (TRULANCE) 3 MG TABS Take 1 tablet (3 mg total) by mouth daily. 90 tablet 3   promethazine-dextromethorphan (PROMETHAZINE-DM) 6.25-15 MG/5ML syrup Take 5 mLs by mouth  4 (four) times daily as needed. 118 mL 0   No facility-administered medications prior to visit.    Allergies  Allergen Reactions   Vinegar [Acetic Acid] Hives and Itching   Sulfa Antibiotics Rash    ROS Review of Systems  Constitutional:  Negative for chills and fever.  Eyes:  Negative for visual disturbance.  Respiratory:  Positive for cough and wheezing. Negative for chest tightness and shortness of breath.   Cardiovascular:  Negative for chest pain and palpitations.  Neurological:  Negative for dizziness and headaches.      Objective:    Physical Exam HENT:     Head: Normocephalic.     Mouth/Throat:     Mouth: Mucous membranes are moist.  Cardiovascular:     Rate and Rhythm: Normal rate.     Heart sounds: Normal heart sounds.  Pulmonary:     Effort: Pulmonary effort is normal.     Breath sounds: Wheezing present.  Neurological:     Mental Status: She is alert.     BP 137/78   Pulse (!) 104   Ht 5\' 1"  (1.549 m)   Wt 226 lb 1.3 oz (102.5 kg)   SpO2 95%   BMI 42.72 kg/m  Wt Readings from Last 3 Encounters:  03/12/23 226 lb 1.3 oz (102.5 kg)  03/10/23 226 lb (102.5 kg)  03/03/23 226 lb (102.5 kg)    Lab Results  Component Value Date   TSH 1.590 05/05/2022   Lab Results  Component Value Date   WBC 8.9 08/06/2022   HGB 16.1 (H) 08/06/2022   HCT 49.4 (H) 08/06/2022   MCV 92 08/06/2022   PLT 287 08/06/2022   Lab Results  Component Value Date    NA 143 08/06/2022   K 4.0 08/06/2022   CO2 18 (L) 08/06/2022   GLUCOSE 88 08/06/2022   BUN 7 08/06/2022   CREATININE 0.68 08/06/2022   BILITOT <0.2 08/06/2022   ALKPHOS 57 08/06/2022   AST 12 08/06/2022   ALT 16 08/06/2022   PROT 7.1 08/06/2022   ALBUMIN 4.4 08/06/2022   CALCIUM 9.5 08/06/2022   ANIONGAP 7 10/01/2021   EGFR 117 08/06/2022   Lab Results  Component Value Date   CHOL 246 (H) 08/06/2022   Lab Results  Component Value Date   HDL 24 (L) 08/06/2022   Lab Results  Component Value Date   LDLCALC 169 (H) 08/06/2022   Lab Results  Component Value Date   TRIG 276 (H) 08/06/2022   Lab Results  Component Value Date   CHOLHDL 10.3 (H) 08/06/2022   Lab Results  Component Value Date   HGBA1C 5.9 (H) 08/06/2022      Assessment & Plan:  There are no diagnoses linked to this encounter.  Follow-up: No follow-ups on file.   Gilmore Laroche, FNP

## 2023-03-12 NOTE — Assessment & Plan Note (Signed)
With start the patient on prednisone for 5 days to decrease inflammation and help with cough Encouraged to continue taking Promethazine DM as needed

## 2023-03-12 NOTE — Patient Instructions (Addendum)
I appreciate the opportunity to provide care to you today!   DVT Management: -Complete the starter pack of Eliquis and request a refill as needed. -You will remain on anticoagulation therapy for 3 months to ensure proper treatment and prevention of complications.  Here are nonpharmacological interventions for managing and preventing deep vein thrombosis (DVT): - Compression Stockings: Wearing graduated compression stockings can help improve blood flow in the legs, reduce swelling, and prevent clots from forming or worsening. These stockings apply pressure to the legs, promoting circulation. - Leg Elevation: Elevating the legs above the level of the heart, especially during rest, can help improve venous return and reduce swelling. This helps relieve pressure in the veins and reduces the risk of clot formation. -Physical Activity: Regular movement is essential to improve blood circulation and prevent blood clots. Activities such as walking, leg exercises, and stretching help improve venous blood flow. Avoid prolonged sitting or standing: People at risk of DVT should take breaks every 1-2 hours, particularly during long flights, car rides, or periods of inactivity. - Hydration: Staying well-hydrated can help prevent the blood from thickening, which can reduce the risk of clot formation. Encourage regular water intake throughout the day. - Avoid Crossing Legs: Avoid crossing the legs when sitting, as this can restrict blood flow and increase the risk of clot formation. -Lifestyle Modifications: Weight management: Maintaining a healthy weight reduces the strain on the veins and improves circulation. -Healthy diet: A balanced diet rich in fruits, vegetables, lean proteins, and whole grains can help maintain vascular health. -Passive leg exercises, such as ankle pumps, can also be done when a patient is unable to walk. -Avoid Tight Clothing: Tight clothing, especially around the legs and waist, can  restrict blood flow and increase the risk of clot formation. - Positioning During Rest: Avoid sitting or lying down for extended periods without moving the legs. Frequent leg movement or flexing the feet can promote blood circulation. Please stop by your local pharmacy and get your Tdap and Shingles vaccine  Seek Immediate Medical Attention If: You experience any symptoms of a pulmonary embolism, such as shortness of breath, chest pain, or coughing up blood. Your leg pain, swelling, or redness worsens despite treatment. You develop new or worsening symptoms that concern you. Prompt treatment is essential to prevent complications, such as the clot breaking loose and traveling to the lungs. If you experience any of these symptoms, it is crucial to seek urgent care or go to the emergency room immediately.   Referrals today-   Attached with your AVS, you will find valuable resources for self-education. I highly recommend dedicating some time to thoroughly examine them.   Please continue to a heart-healthy diet and increase your physical activities. Try to exercise for at least five days a week.    It was a pleasure to see you and I look forward to continuing to work together on your health and well-being. Please do not hesitate to call the office if you need care or have questions about your care.  In case of emergency, please visit the Emergency Department for urgent care, or contact our clinic at [phone] to schedule an appointment. We're here to help you!   Have a wonderful day and week. With Gratitude, Gilmore Laroche MSN, FNP-BC

## 2023-03-13 DIAGNOSIS — I82452 Acute embolism and thrombosis of left peroneal vein: Secondary | ICD-10-CM | POA: Insufficient documentation

## 2023-03-13 NOTE — Assessment & Plan Note (Addendum)
The patient is encouraged to continue her starter pack of Eliquis 5 mg. She will remain on anticoagulant therapy for 3 months. The patient is also encouraged to maintain regular movement, use compression stockings, elevate her legs, and avoid prolonged standing or sitting.

## 2023-03-23 ENCOUNTER — Ambulatory Visit: Payer: Medicaid Other | Admitting: Gastroenterology

## 2023-04-08 ENCOUNTER — Other Ambulatory Visit: Payer: Self-pay

## 2023-04-08 ENCOUNTER — Emergency Department (HOSPITAL_COMMUNITY)
Admission: EM | Admit: 2023-04-08 | Discharge: 2023-04-08 | Disposition: A | Discharge status: Home / Self Care | Payer: Medicaid Other | Attending: Emergency Medicine | Admitting: Emergency Medicine

## 2023-04-08 ENCOUNTER — Encounter (HOSPITAL_COMMUNITY): Payer: Self-pay

## 2023-04-08 DIAGNOSIS — M79605 Pain in left leg: Secondary | ICD-10-CM

## 2023-04-08 DIAGNOSIS — Z7901 Long term (current) use of anticoagulants: Secondary | ICD-10-CM | POA: Diagnosis not present

## 2023-04-08 DIAGNOSIS — I82452 Acute embolism and thrombosis of left peroneal vein: Secondary | ICD-10-CM | POA: Insufficient documentation

## 2023-04-08 DIAGNOSIS — M7989 Other specified soft tissue disorders: Secondary | ICD-10-CM | POA: Diagnosis present

## 2023-04-08 MED ORDER — APIXABAN 5 MG PO TABS: 5.0000 mg | ORAL_TABLET | Freq: Two times a day (BID) | ORAL | 1 refills | Status: AC

## 2023-04-08 MED ORDER — OXYCODONE HCL 5 MG PO TABS: 5.0000 mg | ORAL_TABLET | ORAL | 0 refills | Status: AC | PRN

## 2023-04-08 MED ORDER — OXYCODONE HCL 5 MG PO TABS
5.0000 mg | ORAL_TABLET | Freq: Once | ORAL | Status: AC
  Administered 2023-04-08: 5 mg via ORAL
  Filled 2023-04-08: qty 1

## 2023-04-08 NOTE — ED Provider Notes (Signed)
[place] EMERGENCY DEPARTMENT AT [facility] Provider Note   CSN: [id number] Arrival date & time: 04/08/23  4098     History Chief Complaint  Patient presents with   Leg Swelling    Patricia Williamson is a 37 y.o. female patient with recent diagnosis of DVT currently on Eliquis starter pack who presents to the emergency department with left lower leg pain and swelling.  Patient states that she has been taking the Eliquis as prescribed but when she is on her feet all day she starts having increasing level of pain and swelling in the left lower extremity.  She is also fearful that her Medicaid runs out at the end of the year and that she will not be able to afford the Eliquis medication.  She is spoken with her primary care doctor about this and they have not come to resolution yet.  Her primary care doctor sent her here for further evaluation.  Patient denies any chest pain or shortness of breath, fever, chills.  HPI     Home Medications Prior to Admission medications   Medication Sig Start Date End Date Taking? Authorizing Provider  apixaban (ELIQUIS) 5 MG TABS tablet Take 1 tablet (5 mg total) by mouth 2 (two) times daily. 04/08/23  Yes Meredeth Ide, Adrielle Polakowski M, PA-C  oxyCODONE (ROXICODONE) 5 MG immediate release tablet Take 1 tablet (5 mg total) by mouth every 4 (four) hours as needed for severe pain (pain score 7-10). 04/08/23  Yes Meredeth Ide, Sharina Petre M, PA-C  albuterol (VENTOLIN HFA) 108 (90 Base) MCG/ACT inhaler Inhale 1-2 puffs into the lungs every 6 (six) hours as needed for wheezing or shortness of breath. 09/10/22   Del Newman Nip, Tenna Child, FNP  APIXABAN Everlene Balls) VTE STARTER PACK (10MG  AND 5MG ) Take as directed on package: start with two-5mg  tablets twice daily for 7 days. On day 8, switch to one-5mg  tablet twice daily. 03/10/23   Gloris Manchester, MD  budesonide-formoterol [facility]) 160-4.5 MCG/ACT inhaler Inhale 2 puffs into the lungs 2 (two) times daily. 12/30/22   Gilmore Laroche,  FNP  megestrol (MEGACE) 40 MG tablet Take 40 mg by mouth 2 (two) times daily.    [provider]  pantoprazole (PROTONIX) 40 MG tablet Take 1 tablet (40 mg total) by mouth 2 (two) times daily before a meal. Patient taking differently: Take 40 mg by mouth daily. 07/23/22   Gelene Mink, NP  Plecanatide (TRULANCE) 3 MG TABS Take 1 tablet (3 mg total) by mouth daily. 02/16/23   Gelene Mink, NP  promethazine-dextromethorphan (PROMETHAZINE-DM) 6.25-15 MG/5ML syrup Take 5 mLs by mouth 4 (four) times daily as needed. 03/03/23   Gilmore Laroche, FNP      Allergies    Vinegar [acetic acid] and Sulfa antibiotics    Review of Systems   Review of Systems  All other systems reviewed and are negative.   Physical Exam Updated Vital Signs BP 137/87 (BP Location: Left Arm)   Pulse 85   Temp 97.7 F (36.5 C) (Oral)   Resp 17   Ht 5\' 1"  (1.549 m)   Wt 102.5 kg   LMP  (Exact Date) Comment: 2015 last period  SpO2 94%   BMI 42.70 kg/m  Physical Exam Vitals and nursing note reviewed.  Constitutional:      General: She is not in acute distress.    Appearance: Normal appearance.  HENT:     Head: Normocephalic and atraumatic.  Eyes:     General:  Right eye: No discharge.        Left eye: No discharge.  Cardiovascular:     Comments: Regular rate and rhythm.  S1/S2 are distinct without any evidence of murmur, rubs, or gallops.  Radial pulses are 2+ bilaterally.  Dorsalis pedis pulses are 2+ bilaterally.  No evidence of pedal edema. Pulmonary:     Comments: Clear to auscultation bilaterally.  Normal effort.  No respiratory distress.  No evidence of wheezes, rales, or rhonchi heard throughout. Abdominal:     General: Abdomen is flat. Bowel sounds are normal. There is no distension.     Tenderness: There is no abdominal tenderness. There is no guarding or rebound.  Musculoskeletal:        General: Normal range of motion.     Cervical back: Neck supple.     Comments: Bilateral 2+  dorsalis pedis pulses felt in both feet.  They are symmetric.  Good cap refill in the toes.  Good sensation in the legs.   Skin:    General: Skin is warm and dry.     Findings: No rash.     Comments: Left lower extremity color is normal.  There is no evidence of phlegmasia alba dolens or phlegmasia cerulea dolens.  Neurological:     General: No focal deficit present.     Mental Status: She is alert.  Psychiatric:        Mood and Affect: Mood normal.        Behavior: Behavior normal.     ED Results / Procedures / Treatments   Labs (all labs ordered are listed, but only abnormal results are displayed) Labs Reviewed - No data to display  EKG None  Radiology No results found.  Procedures Procedures    Medications Ordered in ED Medications  oxyCODONE (Oxy IR/ROXICODONE) immediate release tablet 5 mg (5 mg Oral Given 04/08/23 1012)    ED Course/ Medical Decision Making/ A&P Clinical Course as of 04/08/23 1038  Thu Apr 08, 2023  1005 I spoke with Jeannett Senior at the [facility] pharmacy who will come down and speak with the patient about financial assistance for the Eliquis.  In the meantime, we will likely give her a prescription for the 30-day supply of Eliquis and have her fill out the assistance form.  If the assistance form for what ever reason get denied then the plan should be to switch over to Coumadin and get her plugged in with the Coumadin clinic. [CF]  1035 Pharmacy evaluate the patient at bedside.  We will give her a 30-day supply of Eliquis with 1 refill as this will give her the 74-month guideline therapy including the starter pack.  Patient agreeable plan and she feels strongly that she could get this filled before her insurance runs out.  She was encouraged to come back for any problems. [CF]    Clinical Course User Index [CF] Teressa Lower, PA-C   {   Click here for ABCD2, HEART and other calculators  Medical Decision Making Patricia Williamson is a 37 y.o. female  patient who presents to the emergency department today for further evaluation of leg pain and medication questions.  We got a game plan with regards to her Eliquis.  You can see ED course for further detail.  With regards to her leg pain.  I think this is just natural progression of the DVT.  There is no evidence of phlegmasia cerulea or phlegmasia alba.  She has good pulses in both  feet.  Strict return precautions were discussed.  Going to write her for some narcotic pain medication she can take as needed she cannot take ibuprofen secondary Eliquis and she has medication side effects to Tylenol.  Patient agreeable with plan.  She is safe for discharge.   Risk Prescription drug management.    Final Clinical Impression(s) / ED Diagnoses Final diagnoses:  Pain of left lower extremity  Acute deep vein thrombosis (DVT) of left peroneal vein (HCC)    Rx / DC Orders ED Discharge Orders          Ordered    apixaban (ELIQUIS) 5 MG TABS tablet  2 times daily        04/08/23 1034    oxyCODONE (ROXICODONE) 5 MG immediate release tablet  Every 4 hours PRN        04/08/23 1038              Honor Loh Sarles, [place] 04/08/23 1038    Rondel Baton, MD 04/10/23 1659

## 2023-04-08 NOTE — Discharge Instructions (Signed)
As we have discussed, I will send 30-day supply of Eliquis to your pharmacy with 1 refill attached.  Please get the refill filled out before the end of the year before your Medicaid runs out.  May come back to the emergency department for any further problems, worsening symptoms, or if you have any problems getting the medication.

## 2023-04-08 NOTE — ED Triage Notes (Signed)
Pt c/o swelling to left leg that has not decreased as told on Oct 24th from her blood clot. Pt states she has taken her blood thinner as prescribed. Pt states her left leg began swelling more since Fri and is painful.

## 2023-06-10 ENCOUNTER — Ambulatory Visit: Payer: Medicaid Other | Admitting: Family Medicine

## 2023-07-03 ENCOUNTER — Other Ambulatory Visit: Payer: Self-pay | Admitting: Family Medicine

## 2023-07-30 ENCOUNTER — Emergency Department (HOSPITAL_COMMUNITY): Payer: Self-pay

## 2023-07-30 ENCOUNTER — Encounter (HOSPITAL_COMMUNITY): Payer: Self-pay

## 2023-07-30 ENCOUNTER — Emergency Department (HOSPITAL_COMMUNITY)
Admission: EM | Admit: 2023-07-30 | Discharge: 2023-07-30 | Disposition: A | Discharge status: Home / Self Care | Payer: Self-pay | Attending: Emergency Medicine | Admitting: Emergency Medicine

## 2023-07-30 ENCOUNTER — Other Ambulatory Visit: Payer: Self-pay

## 2023-07-30 DIAGNOSIS — Z7901 Long term (current) use of anticoagulants: Secondary | ICD-10-CM | POA: Insufficient documentation

## 2023-07-30 DIAGNOSIS — M79642 Pain in left hand: Secondary | ICD-10-CM | POA: Insufficient documentation

## 2023-07-30 LAB — CBC WITH DIFFERENTIAL/PLATELET
Abs Immature Granulocytes: 0.05 10*3/uL (ref 0.00–0.07)
Basophils Absolute: 0.1 10*3/uL (ref 0.0–0.1)
Basophils Relative: 1 %
Eosinophils Absolute: 0.1 10*3/uL (ref 0.0–0.5)
Eosinophils Relative: 1 %
HCT: 47.1 % — ABNORMAL HIGH (ref 36.0–46.0)
Hemoglobin: 16.2 g/dL — ABNORMAL HIGH (ref 12.0–15.0)
Immature Granulocytes: 1 %
Lymphocytes Relative: 25 %
Lymphs Abs: 2.1 10*3/uL (ref 0.7–4.0)
MCH: 30.5 pg (ref 26.0–34.0)
MCHC: 34.4 g/dL (ref 30.0–36.0)
MCV: 88.5 fL (ref 80.0–100.0)
Monocytes Absolute: 0.5 10*3/uL (ref 0.1–1.0)
Monocytes Relative: 6 %
Neutro Abs: 5.6 10*3/uL (ref 1.7–7.7)
Neutrophils Relative %: 66 %
Platelets: 283 10*3/uL (ref 150–400)
RBC: 5.32 MIL/uL — ABNORMAL HIGH (ref 3.87–5.11)
RDW: 13.6 % (ref 11.5–15.5)
WBC: 8.4 10*3/uL (ref 4.0–10.5)
nRBC: 0 % (ref 0.0–0.2)

## 2023-07-30 LAB — BASIC METABOLIC PANEL
Anion gap: 12 (ref 5–15)
BUN: 6 mg/dL (ref 6–20)
CO2: 21 mmol/L — ABNORMAL LOW (ref 22–32)
Calcium: 9.1 mg/dL (ref 8.9–10.3)
Chloride: 106 mmol/L (ref 98–111)
Creatinine, Ser: 0.64 mg/dL (ref 0.44–1.00)
GFR, Estimated: >60 mL/min (ref >60)
Glucose, Bld: 145 mg/dL — ABNORMAL HIGH (ref 70–99)
Potassium: 3.1 mmol/L — ABNORMAL LOW (ref 3.5–5.1)
Sodium: 139 mmol/L (ref 135–145)

## 2023-07-30 MED ORDER — PREDNISONE 10 MG PO TABS: 40.0000 mg | ORAL_TABLET | Freq: Every day | ORAL | 0 refills | Status: AC

## 2023-07-30 MED ORDER — POTASSIUM CHLORIDE CRYS ER 20 MEQ PO TBCR
40.0000 meq | EXTENDED_RELEASE_TABLET | Freq: Once | ORAL | Status: AC
Start: 2023-07-30 — End: 2023-07-30
  Administered 2023-07-30: 40 meq via ORAL
  Filled 2023-07-30: qty 2

## 2023-07-30 NOTE — ED Triage Notes (Signed)
 Pt complaining of left hand pain. Pt states that the pain is in her joints, but the main source of pain is in her thumb and states that it radiates towards her elbow with movement. Pt stated that she has had the pain for nearly a month. Tylenol and Ibuprofen have stopped working

## 2023-07-30 NOTE — ED Notes (Signed)
 See triage notes. No deformity noted. Denies injury. Radial pulses present. Nad.

## 2023-07-30 NOTE — ED Provider Notes (Signed)
 [place] EMERGENCY DEPARTMENT AT [facility] Provider Note   CSN: [id number] Arrival date & time: 07/30/23  1039     History  Chief Complaint  Patient presents with   Hand Pain    Patricia Williamson is a 38 y.o. female.  Patient is a 38 year old female who presents emergency department with a chief complaint of pain in her left hand.  She notes that she does have a history of carpal tunnel syndrome but notes that this does not feel similar to previous.  She notes that the pain is worse in the joints of her left hand and worse in the left thumb.  She denies any recent falls or blunt trauma.  She denies any associated overlying erythema but does admit to associated swelling.  She denies any associated dizziness, lightheadedness or syncope.  She does admit to some increased swelling to the left upper extremity throughout.  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   Hand Pain       Home Medications Prior to Admission medications   Medication Sig Start Date End Date Taking? Authorizing Provider  albuterol (VENTOLIN HFA) 108 (90 Base) MCG/ACT inhaler Inhale 1-2 puffs into the lungs every 6 (six) hours as needed for wheezing or shortness of breath. 09/10/22   Del Newman Nip, Tenna Child, FNP  apixaban (ELIQUIS) 5 MG TABS tablet Take 1 tablet (5 mg total) by mouth 2 (two) times daily. 04/08/23   Honor Loh M, PA-C  APIXABAN Everlene Balls) VTE STARTER PACK (10MG  AND 5MG ) Take as directed on package: start with two-5mg  tablets twice daily for 7 days. On day 8, switch to one-5mg  tablet twice daily. 03/10/23   Gloris Manchester, MD  budesonide-formoterol [facility]) 160-4.5 MCG/ACT inhaler Inhale 2 puffs into the lungs 2 (two) times daily. 12/30/22   Gilmore Laroche, FNP  megestrol (MEGACE) 40 MG tablet Take 40 mg by mouth 2 (two) times daily.    [provider]  oxyCODONE (ROXICODONE) 5 MG immediate release tablet Take 1 tablet (5 mg total) by mouth every 4 (four)  hours as needed for severe pain (pain score 7-10). 04/08/23   Honor Loh M, PA-C  pantoprazole (PROTONIX) 40 MG tablet Take 1 tablet (40 mg total) by mouth 2 (two) times daily before a meal. Patient taking differently: Take 40 mg by mouth daily. 07/23/22   Gelene Mink, NP  Plecanatide (TRULANCE) 3 MG TABS Take 1 tablet (3 mg total) by mouth daily. 02/16/23   Gelene Mink, NP  promethazine-dextromethorphan (PROMETHAZINE-DM) 6.25-15 MG/5ML syrup Take 5 mLs by mouth 4 (four) times daily as needed. 03/03/23   Gilmore Laroche, FNP      Allergies    Vinegar [acetic acid] and Sulfa antibiotics    Review of Systems   Review of Systems  Skin:        Left hand pain and left upper extremity swelling  All other systems reviewed and are negative.   Physical Exam Updated Vital Signs BP (!) 140/86   Pulse 96   Temp 98.6 F (37 C)   Resp 18   Ht 5\' 1"  (1.549 m)   Wt 85.7 kg   SpO2 98%   BMI 35.71 kg/m  Physical Exam Vitals and nursing note reviewed.  Constitutional:      Appearance: Normal appearance.  HENT:     Head: Normocephalic and atraumatic.  Eyes:     Extraocular Movements: Extraocular movements intact.     Conjunctiva/sclera: Conjunctivae  normal.     Pupils: Pupils are equal, round, and reactive to light.  Cardiovascular:     Rate and Rhythm: Normal rate and regular rhythm.     Pulses: Normal pulses.     Heart sounds: Normal heart sounds.  Pulmonary:     Effort: Pulmonary effort is normal. No respiratory distress.     Breath sounds: Normal breath sounds. No wheezing.  Musculoskeletal:        General: Normal range of motion.     Cervical back: Normal range of motion and neck supple.     Comments: Tenderness palpation noted over the left hand diffusely, edema noted over the left hand and left forearm, no overlying erythema or warmth, radial pulse 2+ upper extremities, cap refill less than 2 seconds distally, sensation intact distally, full range of motion noted throughout,  no obvious deformity or bruising, no skin breakdown or ulceration, no lacerations or abrasions  Skin:    General: Skin is warm and dry.  Neurological:     General: No focal deficit present.     Mental Status: She is alert and oriented to person, place, and time. Mental status is at baseline.  Psychiatric:        Mood and Affect: Mood normal.        Behavior: Behavior normal.        Thought Content: Thought content normal.        Judgment: Judgment normal.     ED Results / Procedures / Treatments   Labs (all labs ordered are listed, but only abnormal results are displayed) Labs Reviewed  CBC WITH DIFFERENTIAL/PLATELET  BASIC METABOLIC PANEL    EKG None  Radiology No results found.  Procedures Procedures    Medications Ordered in ED Medications - No data to display  ED Course/ Medical Decision Making/ A&P                                 Medical Decision Making Amount and/or Complexity of Data Reviewed Labs: ordered. Radiology: ordered.  Risk Prescription drug management.   This patient presents to the ED for concern of left hand pain differential diagnosis includes arthritis, rheumatoid arthritis, strain, sprain, carpal tunnel syndrome, acute arterial insufficiency, DVT, cellulitis, septic joint    Additional history obtained:  Additional history obtained from none External records from outside source obtained and reviewed including none   Lab Tests:  I Ordered, and personally interpreted labs.  The pertinent results include: No leukocytosis, hypokalemia, no changes in kidney function   Imaging Studies ordered:  I ordered imaging studies including x-ray of left hand and venous duplex of left upper extremity I independently visualized and interpreted imaging which showed no acute osseous injury or lesions, no DVT I agree with the radiologist interpretation   Medicines ordered and prescription drug management:  I ordered medication including  prednisone for inflammation Reevaluation of the patient after these medicines showed that the patient improved I have reviewed the patients home medicines and have made adjustments as needed   Problem List / ED Course:  Patient is doing well at this time and is stable for discharge home.  Discussed with patient that concern she may be suffering from underlying rheumatoid arthritis versus autoimmune disorder.  Carpal tunnel syndrome does remain in the differential.  She has no indication for septic joint or gout.  She has no indication for cellulitis or abscess remission.  Ultrasound was negative  for DVT.  She has no indication for fracture or bony lesion.  Close follow-up with primary care doctor was discussed and resources were provided.  Strict turn precautions was discussed as well.  Patient voiced understanding and had no additional questions.  Patient has strong peripheral pulses with no indication for acute arterial insufficiency or occlusion.   Social Determinants of Health:  None           Final Clinical Impression(s) / ED Diagnoses Final diagnoses:  None    Rx / DC Orders ED Discharge Orders     None         Kathlen Mody 07/30/23 1344    Bethann Berkshire, MD 07/31/23 [phone]

## 2023-07-30 NOTE — Discharge Instructions (Signed)
 Lease follow-up closely with a primary care doctor on an outpatient basis for continued workup.  Return to emergency department immediately for any new or worsening symptoms.  [facility] Primary Care Doctor List    Syliva Overman, MD. Specialty: [facility] Medicine Contact information: [address]  [place] [address]  [phone]   Lilyan Punt, MD. Specialty: [facility] Medicine Contact information: [address]  [place] [address]  [phone]   Avon Gully, MD Specialty: Internal Medicine Contact information: [address] [place] [address]  [phone]   Catalina Pizza, MD. Specialty: Internal Medicine Contact information: [address]  [place] [address]  [phone]    [facility] (Dr. Selena Batten) Specialty: Family Medicine Contact information: [address]  [place] [address]  [phone]   John Giovanni, MD. Specialty: [facility] Medicine Contact information: [address]  [address]  [place] [address]  [phone]   Carylon Perches, MD. Specialty: Internal Medicine Contact information: [address]  [address]  [place] [address]  [phone]   [facility] Family Medicine: [address] [phone]  [facility], Family medicine [address]  [phone]  [facility] [address] [place], [place] [phone]  [facility]: [address] [phone]    [facility]  [address] [place], [place] [address] [phone]  Services [facility] offers a variety of basic health services.  Services include but are not limited to: Blood pressure checks  Heart rate checks  Blood sugar checks  Urine analysis  Rapid strep tests  Pregnancy tests.  Health education and referrals  People needing more complex services will be directed to a physician online. Using these virtual visits,  doctors can evaluate and prescribe medicine and treatments. There will be no medication on-site, though [facility] will help patients fill their prescriptions at little to no cost.   For More information please go to: [email or web address]  Allergy and Asthma:    [address] [place] [facility] [phone]  Urology:  [address]  [place] [phone]  [facility]  [address] [place], [place] [phone]  Orthopedics   [address] [place], [place] [phone]  Endocrinology  [address] [place], [place] [phone]  Podiatry: [facility] [phone]

## 2023-10-27 ENCOUNTER — Other Ambulatory Visit: Payer: Self-pay | Admitting: *Deleted

## 2023-10-28 MED ORDER — MEGESTROL ACETATE 40 MG PO TABS: 40.0000 mg | ORAL_TABLET | Freq: Two times a day (BID) | ORAL | 0 refills | Status: AC

## 2023-12-16 ENCOUNTER — Other Ambulatory Visit: Payer: Self-pay

## 2023-12-16 ENCOUNTER — Emergency Department (HOSPITAL_COMMUNITY)
Admission: EM | Admit: 2023-12-16 | Discharge: 2023-12-16 | Disposition: A | Discharge status: Home / Self Care | Payer: Self-pay | Attending: Emergency Medicine | Admitting: Emergency Medicine

## 2023-12-16 ENCOUNTER — Encounter (HOSPITAL_COMMUNITY): Payer: Self-pay

## 2023-12-16 ENCOUNTER — Emergency Department (HOSPITAL_COMMUNITY): Payer: Self-pay

## 2023-12-16 DIAGNOSIS — M79671 Pain in right foot: Secondary | ICD-10-CM | POA: Insufficient documentation

## 2023-12-16 DIAGNOSIS — Z7901 Long term (current) use of anticoagulants: Secondary | ICD-10-CM | POA: Insufficient documentation

## 2023-12-16 DIAGNOSIS — Y92812 Truck as the place of occurrence of the external cause: Secondary | ICD-10-CM | POA: Insufficient documentation

## 2023-12-16 DIAGNOSIS — W19XXXA Unspecified fall, initial encounter: Secondary | ICD-10-CM | POA: Insufficient documentation

## 2023-12-16 DIAGNOSIS — S93401A Sprain of unspecified ligament of right ankle, initial encounter: Secondary | ICD-10-CM | POA: Insufficient documentation

## 2023-12-16 MED ORDER — HYDROCODONE-ACETAMINOPHEN 5-325 MG PO TABS
1.0000 | ORAL_TABLET | Freq: Once | ORAL | Status: AC
  Administered 2023-12-16: 1 via ORAL
  Filled 2023-12-16: qty 1

## 2023-12-16 NOTE — ED Triage Notes (Signed)
 Patient fell from a truck on Tuesday 7/29 and right foot went in to hole and heard a loud pop, twisted foot and pain went up leg. Right ankle and foot appears to be swollen. Foot tingling when ambulating. Pulses and capillary refill intact at time of triage.

## 2023-12-16 NOTE — ED Notes (Signed)
 MD at bedside.

## 2023-12-16 NOTE — ED Provider Notes (Signed)
 [place] EMERGENCY DEPARTMENT AT [facility] Provider Note   CSN: [id number] Arrival date & time: 12/16/23  9047     Patient presents with: Patricia Williamson is a 38 y.o. female.   HPI 38 year old female presents with a right ankle injury.  She fell while getting out of a truck and her foot went into a hole 2 days ago.  Not sure if she twisted it.  She then injured again later that night.  She has been ambulating but it is painful and there is lateral ankle swelling.  Prior to Admission medications   Medication Sig Start Date End Date Taking? Authorizing Provider  albuterol  (VENTOLIN  HFA) 108 (90 Base) MCG/ACT inhaler Inhale 1-2 puffs into the lungs every 6 (six) hours as needed for wheezing or shortness of breath. 09/10/22   Del Wilhelmena Falter, Hilario, FNP  apixaban  (ELIQUIS ) 5 MG TABS tablet Take 1 tablet (5 mg total) by mouth 2 (two) times daily. 04/08/23   Theotis Cameron HERO, PA-C  APIXABAN  (ELIQUIS ) VTE STARTER PACK (10MG  AND 5MG ) Take as directed on package: start with two-5mg  tablets twice daily for 7 days. On day 8, switch to one-5mg  tablet twice daily. 03/10/23   Melvenia Motto, MD  budesonide -formoterol  (SYMBICORT ) 160-4.5 MCG/ACT inhaler Inhale 2 puffs into the lungs 2 (two) times daily. 12/30/22   Zarwolo, Gloria, FNP  megestrol  (MEGACE ) 40 MG tablet Take 1 tablet (40 mg total) by mouth 2 (two) times daily. 10/28/23   Ozan, Jennifer, DO  oxyCODONE  (ROXICODONE ) 5 MG immediate release tablet Take 1 tablet (5 mg total) by mouth every 4 (four) hours as needed for severe pain (pain score 7-10). 04/08/23   Theotis Cameron M, PA-C  pantoprazole  (PROTONIX ) 40 MG tablet Take 1 tablet (40 mg total) by mouth 2 (two) times daily before a meal. Patient taking differently: Take 40 mg by mouth daily. 07/23/22   Shirlean Therisa ORN, NP  Plecanatide  (TRULANCE ) 3 MG TABS Take 1 tablet (3 mg total) by mouth daily. 02/16/23   Shirlean Therisa ORN, NP  promethazine -dextromethorphan (PROMETHAZINE -DM)  6.25-15 MG/5ML syrup Take 5 mLs by mouth 4 (four) times daily as needed. 03/03/23   Zarwolo, Gloria, FNP    Allergies: Vinegar [acetic acid] and Sulfa antibiotics    Review of Systems  Musculoskeletal:  Positive for arthralgias and joint swelling.    Updated Vital Signs BP (!) 132/92   Pulse 74   Temp 97.9 F (36.6 C)   Resp 18   Ht 5' 1 (1.549 m)   Wt 85.7 kg   SpO2 97%   BMI 35.71 kg/m   Physical Exam Vitals and nursing note reviewed.  Constitutional:      Appearance: She is well-developed.  HENT:     Head: Normocephalic and atraumatic.  Cardiovascular:     Rate and Rhythm: Normal rate and regular rhythm.     Pulses:          Posterior tibial pulses are 2+ on the right side.  Pulmonary:     Effort: Pulmonary effort is normal.  Musculoskeletal:     Right ankle: Swelling present. Tenderness present over the lateral malleolus. Normal range of motion.     Right Achilles Tendon: No tenderness or defects.     Right foot: No swelling or tenderness.  Skin:    General: Skin is warm and dry.  Neurological:     Mental Status: She is alert.     (all labs ordered are listed, but only  abnormal results are displayed) Labs Reviewed - No data to display  EKG: None  Radiology: DG Foot Complete Right Result Date: 12/16/2023 CLINICAL DATA:  fall, lateral pain, heard a loud pop EXAM: RIGHT FOOT COMPLETE - 3+ VIEW; RIGHT ANKLE - COMPLETE 3+ VIEW COMPARISON:  January 15, 2019 FINDINGS: Right ankle No acute fracture or dislocation. No ankle mortise widening. The talar dome is intact. There is no evidence of arthropathy or other focal bone abnormality. Moderate soft tissue swelling along the lateral ankle. Right foot No acute fracture or dislocation. Small undersurface calcaneal heel spur. Soft tissues are unremarkable. No radiopaque foreign body. IMPRESSION: Moderate soft tissue swelling along the lateral ankle. Otherwise, no acute fracture or dislocation in the right ankle and right  foot. Electronically Signed   By: Rogelia Myers M.D.   On: 12/16/2023 10:51   DG Ankle Complete Right Result Date: 12/16/2023 CLINICAL DATA:  fall, lateral pain, heard a loud pop EXAM: RIGHT FOOT COMPLETE - 3+ VIEW; RIGHT ANKLE - COMPLETE 3+ VIEW COMPARISON:  January 15, 2019 FINDINGS: Right ankle No acute fracture or dislocation. No ankle mortise widening. The talar dome is intact. There is no evidence of arthropathy or other focal bone abnormality. Moderate soft tissue swelling along the lateral ankle. Right foot No acute fracture or dislocation. Small undersurface calcaneal heel spur. Soft tissues are unremarkable. No radiopaque foreign body. IMPRESSION: Moderate soft tissue swelling along the lateral ankle. Otherwise, no acute fracture or dislocation in the right ankle and right foot. Electronically Signed   By: Rogelia Myers M.D.   On: 12/16/2023 10:51     Procedures   Medications Ordered in the ED  HYDROcodone -acetaminophen  (NORCO/VICODIN) 5-325 MG per tablet 1 tablet (1 tablet Oral Given 12/16/23 1032)                                    Medical Decision Making Amount and/or Complexity of Data Reviewed Radiology: ordered and independent interpretation performed.    Details: No fracture  Risk Prescription drug management.   No fracture seen on the x-rays.  Seems to be primarily localized to the left lateral ankle.  It is mildly swollen.  Will put in an Ace wrap but given no fractures I think this is a sprain and can be treated symptomatically with supportive care such as RICE.  She is no longer on blood thinners.  She has no calf swelling or proximal fibular pain/tenderness.  I think she is fine to weight-bear as tolerated, use the Ace wrap, and ibuprofen /ice and rest.  Will give return precautions.  Neurovascular intact.     Final diagnoses:  Sprain of right ankle, unspecified ligament, initial encounter    ED Discharge Orders     None          Freddi Hamilton,  MD 12/16/23 1155

## 2023-12-16 NOTE — Discharge Instructions (Addendum)
 You may wear the Ace wrap to help with swelling.  Take Tylenol  and/or ibuprofen  to help with pain.  Otherwise, follow-up with your primary care provider.  Return to the ER for any new or worsening symptoms.

## 2024-01-07 ENCOUNTER — Encounter: Payer: Self-pay | Admitting: Radiology

## 2024-02-07 ENCOUNTER — Telehealth: Payer: Self-pay

## 2024-02-07 NOTE — Telephone Encounter (Signed)
 FYI:  Pt does not have any health insurance. Waiting to see if she will get approved for Medicaid for another year. She will contact us  if she gets insurance. As of now no appts because she has no way of paying and Rx's

## 2024-03-20 ENCOUNTER — Encounter: Payer: Self-pay | Admitting: Radiology

## 2024-05-08 ENCOUNTER — Emergency Department (HOSPITAL_COMMUNITY): Payer: Self-pay

## 2024-05-08 ENCOUNTER — Emergency Department (HOSPITAL_COMMUNITY)
Admission: EM | Admit: 2024-05-08 | Discharge: 2024-05-08 | Disposition: A | Discharge status: Home / Self Care | Payer: Self-pay

## 2024-05-08 ENCOUNTER — Encounter (HOSPITAL_COMMUNITY): Payer: Self-pay | Admitting: *Deleted

## 2024-05-08 ENCOUNTER — Other Ambulatory Visit: Payer: Self-pay

## 2024-05-08 DIAGNOSIS — R791 Abnormal coagulation profile: Secondary | ICD-10-CM | POA: Insufficient documentation

## 2024-05-08 DIAGNOSIS — Z79899 Other long term (current) drug therapy: Secondary | ICD-10-CM | POA: Insufficient documentation

## 2024-05-08 DIAGNOSIS — K209 Esophagitis, unspecified without bleeding: Secondary | ICD-10-CM | POA: Insufficient documentation

## 2024-05-08 DIAGNOSIS — J45909 Unspecified asthma, uncomplicated: Secondary | ICD-10-CM | POA: Insufficient documentation

## 2024-05-08 DIAGNOSIS — Z7951 Long term (current) use of inhaled steroids: Secondary | ICD-10-CM | POA: Insufficient documentation

## 2024-05-08 DIAGNOSIS — Z7901 Long term (current) use of anticoagulants: Secondary | ICD-10-CM | POA: Insufficient documentation

## 2024-05-08 DIAGNOSIS — J209 Acute bronchitis, unspecified: Secondary | ICD-10-CM | POA: Insufficient documentation

## 2024-05-08 DIAGNOSIS — J101 Influenza due to other identified influenza virus with other respiratory manifestations: Secondary | ICD-10-CM | POA: Insufficient documentation

## 2024-05-08 LAB — BASIC METABOLIC PANEL WITH GFR
Anion gap: 17 — ABNORMAL HIGH (ref 5–15)
BUN: <5 mg/dL — ABNORMAL LOW (ref 6–20)
CO2: 17 mmol/L — ABNORMAL LOW (ref 22–32)
Calcium: 8.8 mg/dL — ABNORMAL LOW (ref 8.9–10.3)
Chloride: 106 mmol/L (ref 98–111)
Creatinine, Ser: 0.57 mg/dL (ref 0.44–1.00)
GFR, Estimated: >60 mL/min
Glucose, Bld: 151 mg/dL — ABNORMAL HIGH (ref 70–99)
Potassium: 3.4 mmol/L — ABNORMAL LOW (ref 3.5–5.1)
Sodium: 140 mmol/L (ref 135–145)

## 2024-05-08 LAB — RESP PANEL BY RT-PCR (RSV, FLU A&B, COVID)  RVPGX2
Influenza A by PCR: POSITIVE — AB
Influenza B by PCR: NEGATIVE
Resp Syncytial Virus by PCR: NEGATIVE
SARS Coronavirus 2 by RT PCR: NEGATIVE

## 2024-05-08 LAB — CBC
HCT: 43.2 % (ref 36.0–46.0)
Hemoglobin: 14.9 g/dL (ref 12.0–15.0)
MCH: 30.2 pg (ref 26.0–34.0)
MCHC: 34.5 g/dL (ref 30.0–36.0)
MCV: 87.4 fL (ref 80.0–100.0)
Platelets: 177 K/uL (ref 150–400)
RBC: 4.94 MIL/uL (ref 3.87–5.11)
RDW: 13.3 % (ref 11.5–15.5)
WBC: 5.6 K/uL (ref 4.0–10.5)
nRBC: 0 % (ref 0.0–0.2)

## 2024-05-08 LAB — D-DIMER, QUANTITATIVE: D-Dimer, Quant: 0.97 ug{FEU}/mL — ABNORMAL HIGH (ref 0.00–0.50)

## 2024-05-08 MED ORDER — ACETAMINOPHEN 500 MG PO TABS
1000.0000 mg | ORAL_TABLET | Freq: Once | ORAL | Status: AC
  Administered 2024-05-08: 1000 mg via ORAL
  Filled 2024-05-08: qty 2

## 2024-05-08 MED ORDER — PROMETHAZINE-DM 6.25-15 MG/5ML PO SYRP: 5.0000 mL | ORAL_SOLUTION | Freq: Four times a day (QID) | ORAL | 0 refills | Status: AC | PRN

## 2024-05-08 MED ORDER — SODIUM CHLORIDE 0.9 % IV BOLUS
1000.0000 mL | Freq: Once | INTRAVENOUS | Status: AC
  Administered 2024-05-08: 1000 mL via INTRAVENOUS

## 2024-05-08 MED ORDER — DEXAMETHASONE SOD PHOSPHATE PF 10 MG/ML IJ SOLN
10.0000 mg | Freq: Once | INTRAMUSCULAR | Status: AC
  Administered 2024-05-08: 10 mg via INTRAVENOUS

## 2024-05-08 MED ORDER — IPRATROPIUM-ALBUTEROL 0.5-2.5 (3) MG/3ML IN SOLN
3.0000 mL | Freq: Once | RESPIRATORY_TRACT | Status: AC
  Administered 2024-05-08: 3 mL via RESPIRATORY_TRACT
  Filled 2024-05-08: qty 3

## 2024-05-08 MED ORDER — LIDOCAINE HCL (PF) 2% IJ FOR NEBU
5.0000 mL | Freq: Once | RESPIRATORY_TRACT | Status: AC
  Administered 2024-05-08: 5 mL via RESPIRATORY_TRACT
  Filled 2024-05-08 (×2): qty 5

## 2024-05-08 MED ORDER — IOHEXOL 350 MG/ML SOLN
75.0000 mL | Freq: Once | INTRAVENOUS | Status: AC | PRN
  Administered 2024-05-08: 75 mL via INTRAVENOUS

## 2024-05-08 NOTE — ED Provider Notes (Signed)
 " [place] [facility] AT [facility] Provider Note   CSN: [id number] Arrival date & time: 05/08/24  9068     Patient presents with: Cough   Patricia Williamson is a 38 y.o. female who presents emergency department with flulike symptoms and shortness of breath.  Patient reports that she had onset of flulike symptoms that she woke up with yesterday.  Her daughter tested positive for influenza A this past Saturday.  She complains of a painful bronchitic cough.  Patient reports that she has had progressively worsening shortness of breath all night long and just feels like she cannot catch it.  She reports a history of asthma and tried using her inhaler without relief of symptoms.  Patient also complains of associated congestion.  She has a history of unprovoked lower extremity DVT and was previously placed on Eliquis  but is no longer on that medication.    Cough      Prior to Admission medications  Medication Sig Start Date End Date Taking? Authorizing Provider  albuterol  (VENTOLIN  HFA) 108 (90 Base) MCG/ACT inhaler Inhale 1-2 puffs into the lungs every 6 (six) hours as needed for wheezing or shortness of breath. 09/10/22   Del Wilhelmena Falter, Hilario, FNP  apixaban  (ELIQUIS ) 5 MG TABS tablet Take 1 tablet (5 mg total) by mouth 2 (two) times daily. 04/08/23   Theotis Peers M, PA-C  APIXABAN  (ELIQUIS ) VTE STARTER PACK (10MG  AND 5MG ) Take as directed on package: start with two-5mg  tablets twice daily for 7 days. On day 8, switch to one-5mg  tablet twice daily. 03/10/23   Melvenia Motto, MD  budesonide -formoterol  (SYMBICORT ) 160-4.5 MCG/ACT inhaler Inhale 2 puffs into the lungs 2 (two) times daily. 12/30/22   Bacchus, Meade PEDLAR, FNP  megestrol  (MEGACE ) 40 MG tablet Take 1 tablet (40 mg total) by mouth 2 (two) times daily. 10/28/23   Ozan, Jennifer, DO  oxyCODONE  (ROXICODONE ) 5 MG immediate release tablet Take 1 tablet (5 mg total) by mouth every 4 (four) hours as needed for severe pain  (pain score 7-10). 04/08/23   Theotis Peers M, PA-C  pantoprazole  (PROTONIX ) 40 MG tablet Take 1 tablet (40 mg total) by mouth 2 (two) times daily before a meal. Patient taking differently: Take 40 mg by mouth daily. 07/23/22   Shirlean Therisa ORN, NP  Plecanatide  (TRULANCE ) 3 MG TABS Take 1 tablet (3 mg total) by mouth daily. 02/16/23   Shirlean Therisa ORN, NP  promethazine -dextromethorphan (PROMETHAZINE -DM) 6.25-15 MG/5ML syrup Take 5 mLs by mouth 4 (four) times daily as needed. 03/03/23   Bacchus, Meade PEDLAR, FNP    Allergies: Vinegar [acetic acid] and Sulfa antibiotics    Review of Systems  Respiratory:  Positive for cough.     Updated Vital Signs BP 135/61   Pulse (!) 114   Temp 99.3 F (37.4 C) (Oral)   Resp (!) 24   Ht 5' 1 (1.549 m)   Wt 85.7 kg   SpO2 97%   BMI 35.71 kg/m   Physical Exam Vitals and nursing note reviewed.  Constitutional:      General: She is not in acute distress.    Appearance: She is well-developed. She is ill-appearing. She is not diaphoretic.  HENT:     Head: Normocephalic and atraumatic.     Right Ear: External ear normal.     Left Ear: External ear normal.     Nose: Nose normal.     Mouth/Throat:     Mouth: Mucous membranes are moist.  Eyes:     General: No scleral icterus.    Conjunctiva/sclera: Conjunctivae normal.  Cardiovascular:     Rate and Rhythm: Normal rate and regular rhythm.     Heart sounds: Normal heart sounds. No murmur heard.    No friction rub. No gallop.  Pulmonary:     Effort: Pulmonary effort is normal. Tachypnea present. No respiratory distress.     Breath sounds: Normal breath sounds. No decreased air movement. Transmitted upper airway sounds: bronchitic , hacking cough.No decreased breath sounds, wheezing, rhonchi or rales.  Abdominal:     General: Bowel sounds are normal. There is no distension.     Palpations: Abdomen is soft. There is no mass.     Tenderness: There is no abdominal tenderness. There is no guarding.   Musculoskeletal:     Cervical back: Normal range of motion.  Skin:    General: Skin is warm and dry.  Neurological:     Mental Status: She is alert and oriented to person, place, and time.  Psychiatric:        Behavior: Behavior normal.     (all labs ordered are listed, but only abnormal results are displayed) Labs Reviewed  RESP PANEL BY RT-PCR (RSV, FLU A&B, COVID)  [id number]  BASIC METABOLIC PANEL WITH GFR  D-DIMER, QUANTITATIVE    EKG: None  Radiology: No results found.   Procedures   Medications Ordered in the ED  acetaminophen  (TYLENOL ) tablet 1,000 mg (has no administration in time range)  lidocaine  2% Nebulized 5 mL (has no administration in time range)  sodium chloride  0.9 % bolus 1,000 mL (has no administration in time range)                                    Medical Decision Making Amount and/or Complexity of Data Reviewed Labs: ordered. Radiology: ordered.  Risk OTC drugs. Prescription drug management.   This patient presents to the ED for concern of sob/ cough/ flu like sxs, this involves an extensive number of treatment options, and is a complaint that carries with it a high risk of complications and morbidity.  The emergent differential diagnosis for shortness of breath includes, but is not limited to, Pulmonary edema, bronchoconstriction, Pneumonia, Pulmonary embolism, Pneumotherax/ Hemothorax, Dysrythmia, ACS.    Co morbidities:   has a past medical history of Asthma, Carpal tunnel syndrome, Gall stones, GERD (gastroesophageal reflux disease), constipation, Migraines, and Nexplanon insertion (10/11/2013).   Social Determinants of Health:   SDOH Screenings   Food Insecurity: No Food Insecurity (09/10/2022)  Housing: Low Risk (09/10/2022)  Transportation Needs: No Transportation Needs (09/10/2022)  Alcohol Screen: Low Risk (09/10/2022)  Depression (PHQ2-9): Low Risk (02/10/2023)  Recent Concern: Depression (PHQ2-9) - Medium Risk (11/18/2022)   Financial Resource Strain: Low Risk (09/10/2022)  Physical Activity: Unknown (09/10/2022)  Recent Concern: Physical Activity - Inactive (09/10/2022)  Social Connections: Moderately Isolated (09/10/2022)  Stress: Patient Declined (09/10/2022)  Tobacco Use: Medium Risk (05/08/2024)   Uninsured   Additional history:  emr   Lab Tests:  I Ordered, and personally interpreted labs.  The pertinent results include:   Labs reviewed.  D-dimer elevated at 0.97.  CBC without abnormality, glucose 151.  Flu positive  Imaging Studies:  I ordered imaging studies including portable 1 view chest x-ray CT angiogram of the chest I independently visualized and interpreted imaging which showed no acute findings.  Associated esophageal inflammatory process suspect esophagitis  I agree with the radiologist interpretation   Medicines ordered and prescription drug management:  I ordered medication including  Medications  acetaminophen  (TYLENOL ) tablet 1,000 mg (1,000 mg Oral Given 05/08/24 1029)  lidocaine  2% Nebulized 5 mL (5 mLs Nebulization Given 05/08/24 1028)  sodium chloride  0.9 % bolus 1,000 mL (0 mLs Intravenous Stopped 05/08/24 1116)  ipratropium-albuterol  (DUONEB) 0.5-2.5 (3) MG/3ML nebulizer solution 3 mL (3 mLs Nebulization Given 05/08/24 1200)  dexamethasone  (DECADRON ) injection 10 mg (10 mg Intravenous Given 05/08/24 1200)  iohexol  (OMNIPAQUE ) 350 MG/ML injection 75 mL (75 mLs Intravenous Contrast Given 05/08/24 1223)   for cough and wheezing Reevaluation of the patient after these medicines showed that the patient improved I have reviewed the patients home medicines and have made adjustments as needed  Test Considered:    Critical Interventions:    Consultations Obtained:   Problem List / ED Course:     ICD-10-CM   1. Influenza A  J10.1     2. Acute bronchitis with bronchospasm  J20.9     3. Esophagitis  K20.90       MDM: 38 year old female with flulike symptoms  positive for the flu.  No evidence of acute pulmonary embolism.  I discussed findings of esophagitis with the patient at bedside reports that she has daily heartburn does not take any medications for this.  I discussed that she needs to be on medications daily and that esophagitis is a huge trigger for her asthma and wheezing and could exacerbate her symptoms further.  She is initially tachypneic and tachycardic however after treatment has improved significantly.  She was given a single dose of Decadron  here with discharged with medications for treatment.  I do not want to give the patient any disown taper at this time because of her esophagitis.  Patient discharged with promethazine  cough syrup, albuterol , strict return precautions   Dispostion:  After consideration of the diagnostic results and the patients response to treatment, I feel that the patent would benefit from discharge.      Final diagnoses:  None    ED Discharge Orders     None          Arloa Chroman, PA-C 05/08/24 1654  "

## 2024-05-08 NOTE — Discharge Instructions (Signed)
 1) take over the counter Nexium daily for your esophagitis 2) use your inhaler 1-2 puffes every 4 hours for cough and wheezing.  Use the cough medicine I have prescribed as directed. Contact a health care provider if: You get new symptoms. You have chest pain. You have watery poop, also called diarrhea. You have a fever. Your cough gets worse. You start to have more mucus. You feel like you may vomit, or you vomit. Get help right away if: You become short of breath or have trouble breathing. Your skin or nails turn blue. You have very bad pain or stiffness in your neck. You get a sudden headache or pain in your face or ear. You vomit each time you eat or drink. These symptoms may be an emergency. Call 911 right away.

## 2024-05-08 NOTE — ED Triage Notes (Signed)
 Pt c/o non-productive cough that started yesterday with sob  Pt states her daughter was diagnosed with the flu this past weekend
